# Patient Record
Sex: Male | Born: 1940 | Race: White | Hispanic: No | Smoking: Former smoker
Health system: Southern US, Community
[De-identification: ages and names within clinical notes are randomized; demographics above are authoritative.]

## PROBLEM LIST (undated history)

## (undated) DIAGNOSIS — F32A Depression, unspecified: Secondary | ICD-10-CM

## (undated) DIAGNOSIS — I639 Cerebral infarction, unspecified: Secondary | ICD-10-CM

## (undated) DIAGNOSIS — E079 Disorder of thyroid, unspecified: Secondary | ICD-10-CM

## (undated) DIAGNOSIS — G20 Parkinson's disease: Secondary | ICD-10-CM

## (undated) DIAGNOSIS — F329 Major depressive disorder, single episode, unspecified: Secondary | ICD-10-CM

## (undated) DIAGNOSIS — R270 Ataxia, unspecified: Secondary | ICD-10-CM

## (undated) DIAGNOSIS — R1310 Dysphagia, unspecified: Secondary | ICD-10-CM

## (undated) DIAGNOSIS — G20A1 Parkinson's disease without dyskinesia, without mention of fluctuations: Secondary | ICD-10-CM

## (undated) DIAGNOSIS — F419 Anxiety disorder, unspecified: Secondary | ICD-10-CM

## (undated) DIAGNOSIS — K219 Gastro-esophageal reflux disease without esophagitis: Secondary | ICD-10-CM

## (undated) DIAGNOSIS — G834 Cauda equina syndrome: Secondary | ICD-10-CM

## (undated) DIAGNOSIS — N390 Urinary tract infection, site not specified: Secondary | ICD-10-CM

## (undated) DIAGNOSIS — G5682 Other specified mononeuropathies of left upper limb: Secondary | ICD-10-CM

## (undated) DIAGNOSIS — M1990 Unspecified osteoarthritis, unspecified site: Secondary | ICD-10-CM

## (undated) HISTORY — DX: Anxiety disorder, unspecified: F41.9

## (undated) HISTORY — DX: Ataxia, unspecified: R27.0

## (undated) HISTORY — DX: Unspecified osteoarthritis, unspecified site: M19.90

## (undated) HISTORY — DX: Major depressive disorder, single episode, unspecified: F32.9

## (undated) HISTORY — DX: Depression, unspecified: F32.A

## (undated) HISTORY — DX: Disorder of thyroid, unspecified: E07.9

## (undated) HISTORY — DX: Cauda equina syndrome: G83.4

## (undated) HISTORY — PX: LAMINECTOMY: SHX219

## (undated) HISTORY — DX: Other specified mononeuropathies of left upper limb: G56.82

## (undated) HISTORY — DX: Dysphagia, unspecified: R13.10

## (undated) HISTORY — PX: IMPLANTATION / PLACEMENT EPIDURAL NEUROSTIMULATOR ELECTRODES: SUR687

## (undated) HISTORY — DX: Urinary tract infection, site not specified: N39.0

## (undated) HISTORY — PX: SPINE SURGERY: SHX786

---

## 2012-12-07 ENCOUNTER — Non-Acute Institutional Stay (SKILLED_NURSING_FACILITY): Payer: 59 | Admitting: Internal Medicine

## 2012-12-07 DIAGNOSIS — E039 Hypothyroidism, unspecified: Secondary | ICD-10-CM

## 2012-12-07 DIAGNOSIS — N400 Enlarged prostate without lower urinary tract symptoms: Secondary | ICD-10-CM

## 2012-12-07 DIAGNOSIS — F411 Generalized anxiety disorder: Secondary | ICD-10-CM

## 2012-12-07 DIAGNOSIS — I10 Essential (primary) hypertension: Secondary | ICD-10-CM

## 2012-12-12 ENCOUNTER — Encounter: Payer: Self-pay | Admitting: Family

## 2012-12-12 ENCOUNTER — Non-Acute Institutional Stay (SKILLED_NURSING_FACILITY): Payer: 59 | Admitting: Family

## 2012-12-12 DIAGNOSIS — E039 Hypothyroidism, unspecified: Secondary | ICD-10-CM

## 2012-12-12 DIAGNOSIS — G4700 Insomnia, unspecified: Secondary | ICD-10-CM

## 2012-12-12 DIAGNOSIS — G250 Essential tremor: Secondary | ICD-10-CM

## 2012-12-12 DIAGNOSIS — Z87898 Personal history of other specified conditions: Secondary | ICD-10-CM

## 2012-12-12 DIAGNOSIS — Z87448 Personal history of other diseases of urinary system: Secondary | ICD-10-CM

## 2012-12-12 NOTE — Progress Notes (Signed)
Patient ID: Kristopher Jennings, male   DOB: August 25, 1940, 72 y.o.   MRN: [id number] Date: 12/12/12  Facility: [facility]    Chief Complaint  Patient presents with  . Acute Visit    Intolerance to medication    HPI: Pt presents with c/o intolerance to Primidone. Pt reports Primidone is not effective and tremors were mitigated with Sinemet solely. Pt denies N/V/D/fever/chills/malaise/sedation, or fatigue. Pt reports having a RUE essential tremor. Pt desires to d/c Primidone. Pt endorses receiving Primidone 150 mg of Primidone bid.  Pt denies further complaints and concerns at present.  Nursing staff denies further concerns and issues at present as well.        Allergies  Allergen Reactions  . Demerol [Meperidine]   . Lactose Intolerance (Gi)   . Nickel   . Oxycodone       Medication List       This list is accurate as of: 12/12/12 11:59 PM.  Always use your most recent med list.               carbidopa-levodopa 25-100 MG per tablet  Commonly known as:  SINEMET IR  Take 2 tablets by mouth 3 (three) times daily.     clonazePAM 0.5 MG tablet  Commonly known as:  KLONOPIN  Take 0.5 mg by mouth at bedtime.     diclofenac sodium 1 % Gel  Commonly known as:  VOLTAREN  Apply 2 g topically 4 (four) times daily. Apply to lower back     docusate sodium 100 MG capsule  Commonly known as:  COLACE  Take 100 mg by mouth 2 (two) times daily.     guaiFENesin 100 MG/5ML Soln  Commonly known as:  ROBITUSSIN  Take 5 mLs by mouth every 4 (four) hours as needed.     hydroxypropyl methylcellulose 2.5 % ophthalmic solution  Commonly known as:  ISOPTO TEARS  Place 1 drop into both eyes 3 (three) times daily as needed (For dryness).     levothyroxine 100 MCG tablet  Commonly known as:  SYNTHROID, LEVOTHROID  Take 100 mcg by mouth daily before breakfast. Take one tablet on Sun, Tues, Wed, Thurs, and Sat     levothyroxine 112 MCG tablet  Commonly known as:  SYNTHROID, LEVOTHROID  Take  112 mcg by mouth daily before breakfast. Take One Tablet Monday and Friday     Melatonin 3 MG Caps  Take by mouth at bedtime.     primidone 50 MG tablet  Commonly known as:  MYSOLINE  Take 50 mg by mouth 2 (two) times daily. Three tablets for total of 150 mg     sertraline 100 MG tablet  Commonly known as:  ZOLOFT  Take 100 mg by mouth daily.     tamsulosin 0.4 MG Caps capsule  Commonly known as:  FLOMAX  Take 0.4 mg by mouth. Take two capsules po qhs        DATA REVIEWED  Laboratory Studies:  12/05/12 WBC-5.7, RBC 4.6, Hemoglobin 13, Hematocrit 39, MCV 84.3, MCH 28.0, MCHC 33.2, RDW 16.9, [place] 170     Past Medical History  Diagnosis Date  . Arthritis   . Depression   . Dysphagia   . UTI (lower urinary tract infection)   . Ataxia   . Thyroid disease   . Anxiety   . Cauda equina syndrome   . Essential Tremors     Past Surgical History  Procedure Laterality Date  . Laminectomy    . Spine  surgery      History   Social History  . Marital Status: Unknown    Spouse Name: N/A    Number of Children: N/A  . Years of Education: N/A   Occupational History  . Not on file.   Social History Main Topics  . Smoking status: Not on file  . Smokeless tobacco: Not on file  . Alcohol Use: Not on file  . Drug Use: Not on file  . Sexual Activity: No   Other Topics Concern  . Not on file   Social History Narrative  . No narrative on file    Review of Systems  Constitutional: Negative.   HENT:       Occasional tension HA; eradicated with Tylenol  Eyes: Negative.   Respiratory: Negative.   Cardiovascular: Negative.   Gastrointestinal: Negative.   Genitourinary: Positive for dysuria and urgency.       H/O Self catherization s/p Cauda Equina  Skin: Negative.   Neurological: Positive for tremors and headaches.       RUE tremor  Psychiatric/Behavioral: Positive for depression.       SI-     Physical Exam Filed Vitals:   12/12/12 1233  BP: 110/78  Pulse:  78  Temp: 98 F (36.7 C)  Resp: 18  SpO2: 97%   There is no height or weight on file to calculate BMI. Physical Exam  Constitutional: He is oriented to person, place, and time. He appears well-developed. No distress.  HENT:  Head: Normocephalic.  Mouth/Throat: Oropharynx is clear and moist.  Eyes: Conjunctivae and EOM are normal. Pupils are equal, round, and reactive to light.  Cardiovascular: Normal rate, regular rhythm and normal heart sounds.   Pulmonary/Chest: Effort normal and breath sounds normal. No respiratory distress. He exhibits no tenderness.  Abdominal: Soft.  Musculoskeletal:  Self-propelled in wheelchair  Neurological: He is alert and oriented to person, place, and time. He displays tremor. GCS eye subscore is 4. GCS verbal subscore is 5. GCS motor subscore is 6.  Tremor to RUE  Skin: Skin is warm and dry.    ASSESSMENT/PLAN  Tremors-decrease dose of Primidone to 100 mg ; follow up with Dr. Haqi-Neurologist; continue Sinemet for tremor management. Urinary Retention: Perform I/O Cath and note findings Arthritis: Continue Voltaren Gel; pt  Stable and medication effective to date Hypothyroidism: Continue with current dosage of Levothyroxine; treatment effective to date Insomnia:Continue with current Melatonin dosage; effective to date and pt stable

## 2012-12-13 DIAGNOSIS — G4700 Insomnia, unspecified: Secondary | ICD-10-CM | POA: Insufficient documentation

## 2012-12-13 DIAGNOSIS — Z87898 Personal history of other specified conditions: Secondary | ICD-10-CM | POA: Insufficient documentation

## 2012-12-13 DIAGNOSIS — G250 Essential tremor: Secondary | ICD-10-CM | POA: Insufficient documentation

## 2012-12-13 DIAGNOSIS — E039 Hypothyroidism, unspecified: Secondary | ICD-10-CM | POA: Insufficient documentation

## 2012-12-13 MED ORDER — PRIMIDONE 50 MG PO TABS: 50.0000 mg | ORAL_TABLET | Freq: Two times a day (BID) | ORAL | Status: DC

## 2013-01-04 ENCOUNTER — Non-Acute Institutional Stay (SKILLED_NURSING_FACILITY): Payer: 59 | Admitting: Internal Medicine

## 2013-01-04 DIAGNOSIS — E039 Hypothyroidism, unspecified: Secondary | ICD-10-CM

## 2013-01-08 DIAGNOSIS — I10 Essential (primary) hypertension: Secondary | ICD-10-CM | POA: Insufficient documentation

## 2013-01-08 DIAGNOSIS — F411 Generalized anxiety disorder: Secondary | ICD-10-CM | POA: Insufficient documentation

## 2013-01-08 DIAGNOSIS — N400 Enlarged prostate without lower urinary tract symptoms: Secondary | ICD-10-CM | POA: Insufficient documentation

## 2013-01-08 NOTE — Progress Notes (Signed)
Patient ID: Kristopher Jennings, male   DOB: 01-19-1941, 72 y.o.   MRN: [id number]        HISTORY & PHYSICAL  DATE: 12/07/2012     FACILITY: [facility]  LEVEL OF CARE: SNF (31)  ALLERGIES:  Allergies  Allergen Reactions  . Demerol [Meperidine]   . Lactose Intolerance (Gi)   . Nickel   . Oxycodone     CHIEF COMPLAINT:  Manage hypertension, hypothyroidism, and BPH.    HISTORY OF PRESENT ILLNESS:  The patient is a 72 year-old, Caucasian male who was hospitalized secondary to difficult with ambulation, altered mentation, dysphagia, and weakness.  After hospitalization, he is admitted to this facility for short-term rehabilitation.   He has the following problems:    HTN: Pt 's HTN remains stable.  Denies CP, sob, DOE, pedal edema, headaches, dizziness or visual disturbances.  No complications from the medications currently being used.  Last BP :  122/80.    HYPOTHYROIDISM: The hypothyroidism remains stable. No complications noted from the medications presently being used.  The patient denies fatigue or constipation.  Last TSH:  Not available.      BPH: The patient's BPH remains stable. Patient denies urinary hesitancy or dribbling. No complications reported from the current medications being used.    PAST MEDICAL HISTORY :  Past Medical History  Diagnosis Date  . Arthritis   . Depression   . Dysphagia   . UTI (lower urinary tract infection)   . Ataxia   . Thyroid disease   . Anxiety   . Cauda equina syndrome   . Essential Tremors    Hypothyroidism.    Obstructive sleep apnea.    GERD.    Neuropathy.    Spinal stenosis.    PAST SURGICAL HISTORY: Past Surgical History  Procedure Laterality Date  . Laminectomy    . Spine surgery     Tonsillectomy.    Deep brain stimulator placement.    Deep brain stimulator electrode revision.    SOCIAL HISTORY: MARITAL HISTORY:  The patient is divorced.   TOBACCO USE:  Former smoker of two packs a day for 25  years.  He quit in 1995.  He smoked cigarettes.   ALCOHOL:  No alcohol use.   ILLICIT DRUGS:  No illicit drug use.    FAMILY HISTORY:  Significant for:    Coronary artery disease.    Diabetes mellitus.    Alzheimer's dementia.    Cancer.    CURRENT MEDICATIONS: Reviewed per [facility]  REVIEW OF SYSTEMS:  See HPI otherwise 14 point ROS is negative.  PHYSICAL EXAMINATION  VS:  T 96.7       P 78      RR 18      BP 122/80      POX 97%        WT (Lb) 125  GENERAL: no acute distress, normal body habitus EYES: conjunctivae normal, sclerae normal, normal eye lids MOUTH/THROAT: lips without lesions,no lesions in the mouth,tongue is without lesions,uvula elevates in midline NECK: supple, trachea midline, no neck masses, no thyroid tenderness, no thyromegaly LYMPHATICS: no LAN in the neck, no supraclavicular LAN RESPIRATORY: breathing is even & unlabored, BS CTAB CARDIAC: RRR, no murmur,no extra heart sounds, no edema GI:  ABDOMEN: abdomen soft, normal BS, no masses, no tenderness  LIVER/SPLEEN: no hepatomegaly, no splenomegaly MUSCULOSKELETAL: HEAD: normal to inspection & palpation BACK: no kyphosis, scoliosis or spinal processes tenderness EXTREMITIES: LEFT UPPER EXTREMITY: full range of motion,  normal strength & tone RIGHT UPPER EXTREMITY:  full range of motion, normal strength & tone LEFT LOWER EXTREMITY: strength intact, range of motion moderate   RIGHT LOWER EXTREMITY: strength intact, range of motion moderate   PSYCHIATRIC: the patient is alert & oriented to person, affect & behavior appropriate NEUROLOGICAL:   The patient has left-sided drooping.    LABS/RADIOLOGY: CBC normal.    CT of the head:  No acute findings.    ASSESSMENT/PLAN:  Hypothyroidism.  Continue levothyroxine.    Hypertension.  Well controlled.    BPH.  Continue current medications.    Anxiety.  Continue clonazepam.    Essential tremor.  Continue primidone.    Depression.  Continue Zoloft.    Check  CBC and BMP.    I have reviewed patient's medical records received at admission/from hospitalization.  CPT CODE: 78295

## 2013-01-11 ENCOUNTER — Non-Acute Institutional Stay (SKILLED_NURSING_FACILITY): Payer: 59 | Admitting: Internal Medicine

## 2013-01-11 DIAGNOSIS — E039 Hypothyroidism, unspecified: Secondary | ICD-10-CM

## 2013-01-11 DIAGNOSIS — N400 Enlarged prostate without lower urinary tract symptoms: Secondary | ICD-10-CM

## 2013-01-11 DIAGNOSIS — F411 Generalized anxiety disorder: Secondary | ICD-10-CM

## 2013-01-11 DIAGNOSIS — G20 Parkinson's disease: Secondary | ICD-10-CM

## 2013-01-12 DIAGNOSIS — G20 Parkinson's disease: Secondary | ICD-10-CM | POA: Insufficient documentation

## 2013-01-12 DIAGNOSIS — G20A1 Parkinson's disease without dyskinesia, without mention of fluctuations: Secondary | ICD-10-CM

## 2013-01-12 HISTORY — DX: Parkinson's disease without dyskinesia, without mention of fluctuations: G20.A1

## 2013-01-12 HISTORY — DX: Parkinson's disease: G20

## 2013-01-12 NOTE — Progress Notes (Signed)
PROGRESS NOTE  DATE: 01/11/2013  FACILITY: Nursing Home Location: [facility]  LEVEL OF CARE: SNF (31)  Routine Visit  CHIEF COMPLAINT:  Manage hypothyroidism, Parkinson's disease and BPH  HISTORY OF PRESENT ILLNESS:  REASSESSMENT OF ONGOING PROBLEM(S):  HYPOTHYROIDISM: The hypothyroidism remains stable. No complications noted from the medications presently being used.  The patient denies fatigue or constipation.  Last TSH in 10/14 TSH 8.27.  BPH: The patient's BPH remains stable. Patient denies urinary hesitancy or dribbling. No complications reported from the current medications being used.  PARKINSON'S DISEASE: pt's Parkinson's disease is stable.  Denies progression of sx recently.  Pt is tolerating Parkinson's disease medications without any complications.  PAST MEDICAL HISTORY : Reviewed.  No changes.  CURRENT MEDICATIONS: Reviewed per [facility]  REVIEW OF SYSTEMS:  GENERAL: no change in appetite, no fatigue, no weight changes, no fever, chills or weakness RESPIRATORY: no cough, SOB, DOE, wheezing, hemoptysis, complains of sore throat CARDIAC: no chest pain, edema or palpitations GI: no abdominal pain, diarrhea, constipation, heart burn, nausea or vomiting  PHYSICAL EXAMINATION  VS:  T 96       P 89     RR 18     BP 121/87     POX %     WT (Lb) 128  GENERAL: no acute distress, normal body habitus EYES: conjunctivae normal, sclerae normal, normal eye lids NECK: supple, trachea midline, no neck masses, no thyroid tenderness, no thyromegaly OROPHARYNX: Clear, no exudate or erythema LYMPHATICS: no LAN in the neck, no supraclavicular LAN RESPIRATORY: breathing is even & unlabored, BS CTAB CARDIAC: RRR, no murmur,no extra heart sounds, no edema GI: abdomen soft, normal BS, no masses, no tenderness, no hepatomegaly, no splenomegaly PSYCHIATRIC: the patient is alert & oriented to person, affect & behavior appropriate  LABS/RADIOLOGY:  Phenobarbital level  6  ASSESSMENT/PLAN:  Hypothyroidism uncontrolled. Levothyroxine was increased. Recheck TSH in 6 weeks if pending. Parkinson's disease-continue medications. BPH-well-controlled Anxiety-Klonopin was started. Constipation-well-controlled Insomnia-well-controlled Depression-on Zoloft. Anxieties-new problem. Start Chloraseptic Spray 2 sprays twice a day for 5 days. Check CBC and CMP  CPT CODE: 16109

## 2013-01-18 ENCOUNTER — Non-Acute Institutional Stay (SKILLED_NURSING_FACILITY): Payer: 59 | Admitting: Internal Medicine

## 2013-01-18 DIAGNOSIS — R3911 Hesitancy of micturition: Secondary | ICD-10-CM

## 2013-01-25 ENCOUNTER — Non-Acute Institutional Stay (SKILLED_NURSING_FACILITY): Payer: 59 | Admitting: Internal Medicine

## 2013-01-25 DIAGNOSIS — N390 Urinary tract infection, site not specified: Secondary | ICD-10-CM

## 2013-01-28 NOTE — Progress Notes (Signed)
Patient ID: Kristopher Jennings, male   DOB: [birth year], 72 y.o.   MRN: [id number]        PROGRESS NOTE  DATE: 01/04/2013  FACILITY:  [facility]  LEVEL OF CARE: SNF (31)  Acute Visit  CHIEF COMPLAINT:  Manage hypothyroidism.    HISTORY OF PRESENT ILLNESS: I was requested by the staff to assess the patient regarding above problem(s):  HYPOTHYROIDISM: The hypothyroidism is unstable. No complications noted from the medications presently being used.  The patient denies fatigue or constipation.  Last TSH:  8.27 on 12/28/2012.    PAST MEDICAL HISTORY : Reviewed.  No changes.  CURRENT MEDICATIONS: Reviewed per [facility]  REVIEW OF SYSTEMS:  Difficult to obtain.  Patient is a poor historian.    PHYSICAL EXAMINATION  GENERAL: no acute distress, normal body habitus NECK: supple, trachea midline, no neck masses, no thyroid tenderness, no thyromegaly RESPIRATORY: breathing is even & unlabored, BS CTAB CARDIAC: RRR, no murmur,no extra heart sounds, no edema GI: abdomen soft, normal BS, no masses, no tenderness, no hepatomegaly, no splenomegaly PSYCHIATRIC: the patient is alert & oriented to person, affect & behavior appropriate  ASSESSMENT/PLAN:  Hypothyroidism.  Unstable problem.  Increase levothyroxine to 112 mcg.  Check TSH in six weeks.    CPT CODE: 40981

## 2013-02-08 ENCOUNTER — Non-Acute Institutional Stay (SKILLED_NURSING_FACILITY): Payer: 59 | Admitting: Internal Medicine

## 2013-02-08 ENCOUNTER — Encounter: Payer: Self-pay | Admitting: Internal Medicine

## 2013-02-08 DIAGNOSIS — E039 Hypothyroidism, unspecified: Secondary | ICD-10-CM

## 2013-02-08 DIAGNOSIS — G20 Parkinson's disease: Secondary | ICD-10-CM

## 2013-02-08 DIAGNOSIS — F411 Generalized anxiety disorder: Secondary | ICD-10-CM

## 2013-02-08 DIAGNOSIS — N400 Enlarged prostate without lower urinary tract symptoms: Secondary | ICD-10-CM

## 2013-02-08 NOTE — Progress Notes (Signed)
PROGRESS NOTE  DATE: 02-08-13  FACILITY: Nursing Home Location: [facility]  LEVEL OF CARE: SNF (31)  Routine Visit  CHIEF COMPLAINT:  Manage hypothyroidism, Parkinson's disease and BPH  HISTORY OF PRESENT ILLNESS:  REASSESSMENT OF ONGOING PROBLEM(S):  HYPOTHYROIDISM: The hypothyroidism remains stable. No complications noted from the medications presently being used.  The patient denies fatigue or constipation.  Last TSH in 10/14 TSH 8.27.  BPH: The patient's BPH remains stable. Patient denies urinary hesitancy or dribbling. No complications reported from the current medications being used.  PARKINSON'S DISEASE: pt's Parkinson's disease is stable.  Denies progression of sx recently.  Pt is tolerating Parkinson's disease medications without any complications.  PAST MEDICAL HISTORY : Reviewed.  No changes.  CURRENT MEDICATIONS: Reviewed per [facility]  REVIEW OF SYSTEMS:  GENERAL: no change in appetite, no fatigue, no weight changes, no fever, chills or weakness RESPIRATORY: no cough, SOB, DOE, wheezing, hemoptysis, complains of sore throat CARDIAC: no chest pain, edema or palpitations GI: no abdominal pain, diarrhea, constipation, heart burn, nausea or vomiting  PHYSICAL EXAMINATION  VS:  T 96.7       P 74     RR 18     BP 130/78     POX %     WT (Lb) 130  GENERAL: no acute distress, normal body habitus NECK: supple, trachea midline, no neck masses, no thyroid tenderness, no thyromegaly RESPIRATORY: breathing is even & unlabored, BS CTAB CARDIAC: RRR, no murmur,no extra heart sounds, no edema GI: abdomen soft, normal BS, no masses, no tenderness, no hepatomegaly, no splenomegaly PSYCHIATRIC: the patient is alert & oriented to person, affect & behavior appropriate  LABS/RADIOLOGY:  10-14 CBC and CMP normal, Phenobarbital level 6  ASSESSMENT/PLAN:  Hypothyroidism uncontrolled. Levothyroxine was increased. Recheck TSH in 6 weeks if pending. Parkinson's  disease-continue medications. BPH-well-controlled Anxiety-stable Constipation-well-controlled Insomnia-well-controlled Depression-on Zoloft.  CPT CODE: 78295

## 2013-02-19 DIAGNOSIS — R3911 Hesitancy of micturition: Secondary | ICD-10-CM | POA: Insufficient documentation

## 2013-02-19 NOTE — Progress Notes (Signed)
Patient ID: Kristopher Jennings, male   DOB: 02/22/1941, 72 y.o.   MRN: [id number]        PROGRESS NOTE  DATE: 01/18/2013  FACILITY:  [facility]  LEVEL OF CARE: SNF (31)  Acute Visit  CHIEF COMPLAINT:  Manage difficulty voiding.    HISTORY OF PRESENT ILLNESS: I was requested by the staff to assess the patient regarding above problem(s):  Staff report that patient is complaining of increasing difficulty in voiding and not knowing when he has to void.  He also has complaints of stiffness and difficulty moving.  Patient overall is a poor historian.  Staff cannot identify precipitating or alleviating factors.  There is no temporal relationship.    PAST MEDICAL HISTORY : Reviewed.  No changes.  CURRENT MEDICATIONS: Reviewed per [facility]  REVIEW OF SYSTEMS:  Difficult to obtain.  Patient is a poor historian.    PHYSICAL EXAMINATION  VS:  T 97.1       P 84      RR 18      BP 120/72     POX %       WT (Lb)  GENERAL: no acute distress, normal body habitus NECK: supple, trachea midline, no neck masses, no thyroid tenderness, no thyromegaly RESPIRATORY: breathing is even & unlabored, BS CTAB CARDIAC: RRR, no murmur,no extra heart sounds, no edema GI: abdomen soft, normal BS, no masses, no tenderness, no hepatomegaly, no splenomegaly PSYCHIATRIC: the patient is alert & oriented to person, affect & behavior appropriate  ASSESSMENT/PLAN:  Urinary hesitancy.  New problem.  Check UA, culture and sensitivities.    CPT CODE: 13244

## 2013-02-22 ENCOUNTER — Non-Acute Institutional Stay (SKILLED_NURSING_FACILITY): Payer: 59 | Admitting: Internal Medicine

## 2013-02-22 DIAGNOSIS — E039 Hypothyroidism, unspecified: Secondary | ICD-10-CM

## 2013-02-23 ENCOUNTER — Encounter: Payer: Self-pay | Admitting: Internal Medicine

## 2013-02-23 NOTE — Progress Notes (Signed)
PROGRESS NOTE  DATE: 02/22/2013  FACILITY:  [facility]  LEVEL OF CARE: SNF (31)  Acute Visit  CHIEF COMPLAINT:  Manage hypothyroidism  HISTORY OF PRESENT ILLNESS: I was requested by the staff to assess the patient regarding above problem(s):  HYPOTHYROIDISM: The hypothyroidism remains stable. No complications noted from the medications presently being used.  The patient denies fatigue or constipation.  Last TSH 5.11 on 02-16-13.  PAST MEDICAL HISTORY : Reviewed.  No changes.  CURRENT MEDICATIONS: Reviewed per [facility]  PHYSICAL EXAMINATION  GENERAL: no acute distress,  Thin body habitus NECK: supple, trachea midline, no neck masses, no thyroid tenderness, no thyromegaly RESPIRATORY: breathing is even & unlabored, BS CTAB CARDIAC: RRR, no murmur,no extra heart sounds, no edema   LABS/RADIOLOGY: see HPI  ASSESSMENT/PLAN:  Hypothyroidism-continue current levothyroxine dose. Check TSH in 6 weeks.  CPT CODE: 96045

## 2013-02-26 DIAGNOSIS — N390 Urinary tract infection, site not specified: Secondary | ICD-10-CM | POA: Insufficient documentation

## 2013-02-26 NOTE — Progress Notes (Signed)
Patient ID: Kristopher Jennings, male   DOB: 04-11-1940, 72 y.o.   MRN: [id number]        PROGRESS NOTE  DATE: 01/25/2013    FACILITY:  [facility]  LEVEL OF CARE: SNF (31)  Acute Visit  CHIEF COMPLAINT:  Manage UTI.    HISTORY OF PRESENT ILLNESS: I was requested by the staff to assess the patient regarding above problem(s):  On 01/19/2013, patient's urinalysis done due to urinary symptoms showed cloudy appearance, +3 WBC esterase, urine nitrite positive, WBCs greater than 30.  Urine culture grows E.coli significantly.    PAST MEDICAL HISTORY : Reviewed.  No changes.  CURRENT MEDICATIONS: Reviewed per [facility]  REVIEW OF SYSTEMS:  GENERAL: no change in appetite, no fatigue, no weight changes, no fever, chills or weakness RESPIRATORY: no cough, SOB, DOE,, wheezing, hemoptysis CARDIAC: no chest pain, edema or palpitations GI: no abdominal pain, diarrhea, constipation, heart burn, nausea or vomiting  PHYSICAL EXAMINATION  GENERAL: no acute distress, thin body habitus NECK: supple, trachea midline, no neck masses, no thyroid tenderness, no thyromegaly RESPIRATORY: breathing is even & unlabored, BS CTAB CARDIAC: RRR, no murmur,no extra heart sounds, no edema GI: abdomen soft, normal BS, no masses, no tenderness, no hepatomegaly, no splenomegaly PSYCHIATRIC: the patient is alert & oriented to person, affect & behavior appropriate  ASSESSMENT/PLAN:  UTI.  New problem.  Start Bactrim double-strength, 1 tablet b.i.d. for one week and probiotics b.i.d. for 10 days.    CPT CODE: 40981

## 2013-03-01 ENCOUNTER — Non-Acute Institutional Stay (SKILLED_NURSING_FACILITY): Payer: 59 | Admitting: Internal Medicine

## 2013-03-01 DIAGNOSIS — E039 Hypothyroidism, unspecified: Secondary | ICD-10-CM

## 2013-03-01 DIAGNOSIS — N400 Enlarged prostate without lower urinary tract symptoms: Secondary | ICD-10-CM

## 2013-03-01 DIAGNOSIS — F411 Generalized anxiety disorder: Secondary | ICD-10-CM

## 2013-03-01 DIAGNOSIS — G20 Parkinson's disease: Secondary | ICD-10-CM

## 2013-03-03 ENCOUNTER — Encounter: Payer: Self-pay | Admitting: Internal Medicine

## 2013-03-03 NOTE — Progress Notes (Signed)
PROGRESS NOTE  DATE: 03-01-13  FACILITY: Nursing Home Location: [facility]  LEVEL OF CARE: SNF (31)  Routine Visit  CHIEF COMPLAINT:  Manage hypothyroidism, Parkinson's disease and BPH  HISTORY OF PRESENT ILLNESS:  REASSESSMENT OF ONGOING PROBLEM(S):  HYPOTHYROIDISM: The hypothyroidism remains stable. No complications noted from the medications presently being used.  The patient denies fatigue or constipation.  Last TSH in 10/14 TSH 8.27, 11-14 TSH 5.11 a.  BPH: The patient's BPH remains stable. Patient denies urinary hesitancy or dribbling. No complications reported from the current medications being used.  PARKINSON'S DISEASE: pt's Parkinson's disease is stable.  Denies progression of sx recently.  Pt is tolerating Parkinson's disease medications without any complications.  PAST MEDICAL HISTORY : Reviewed.  No changes.  CURRENT MEDICATIONS: Reviewed per [facility]  REVIEW OF SYSTEMS:  GENERAL: no change in appetite, no fatigue, no weight changes, no fever, chills or weakness RESPIRATORY: no cough, SOB, DOE, wheezing, hemoptysis, complains of sore throat CARDIAC: no chest pain, edema or palpitations GI: no abdominal pain, diarrhea, constipation, heart burn, nausea or vomiting  PHYSICAL EXAMINATION  VS:  T 96.4       P 88     RR 18     BP 140/70     POX %     WT (Lb) 122  GENERAL: no acute distress, normal body habitus NECK: supple, trachea midline, no neck masses, no thyroid tenderness, no thyromegaly RESPIRATORY: breathing is even & unlabored, BS CTAB CARDIAC: RRR, no murmur,no extra heart sounds, no edema GI: abdomen soft, normal BS, no masses, no tenderness, no hepatomegaly, no splenomegaly PSYCHIATRIC: the patient is alert & oriented to person, affect & behavior appropriate  LABS/RADIOLOGY:  10-14 CBC and CMP normal, Phenobarbital level 6  ASSESSMENT/PLAN:  Hypothyroidism uncontrolled. Recheck TSH in 6 weeks if pending. Parkinson's  disease-continue medications. BPH-well-controlled Anxiety-stable Constipation-well-controlled Insomnia-well-controlled Depression-on Zoloft.  CPT CODE: 78469

## 2013-04-03 ENCOUNTER — Non-Acute Institutional Stay (SKILLED_NURSING_FACILITY): Payer: 59 | Admitting: Internal Medicine

## 2013-04-03 DIAGNOSIS — N400 Enlarged prostate without lower urinary tract symptoms: Secondary | ICD-10-CM

## 2013-04-03 DIAGNOSIS — E039 Hypothyroidism, unspecified: Secondary | ICD-10-CM

## 2013-04-03 DIAGNOSIS — F411 Generalized anxiety disorder: Secondary | ICD-10-CM

## 2013-04-03 DIAGNOSIS — G20 Parkinson's disease: Secondary | ICD-10-CM

## 2013-04-03 NOTE — Progress Notes (Signed)
         PROGRESS NOTE  DATE: 04-03-13  FACILITY: Nursing Home Location[facility]  LEVEL OF CARE: SNF (31)  Routine Visit  CHIEF COMPLAINT:  Manage hypothyroidism, Parkinson's disease and BPH  HISTORY OF PRESENT ILLNESS:  REASSESSMENT OF ONGOING PROBLEM(S):  HYPOTHYROIDISM: The hypothyroidism remains stable. No complications noted from the medications presently being used.  The staff deny fatigue or constipation.  Last TSH in 10/14 TSH 8.27, 11-14 TSH 5.11. Patient is a poor historian.  BPH: The patient's BPH remains stable. Staff deny urinary hesitancy or dribbling. No complications reported from the current medications being used.  PARKINSON'S DISEASE: pt's Parkinson's disease is stable.  Staff Deny progression of sx recently.  Pt is tolerating Parkinson's disease medications without any complications.  PAST MEDICAL HISTORY : Reviewed.  No changes.  CURRENT MEDICATIONS: Reviewed per [facility]  REVIEW OF SYSTEMS: Difficult to obtain due to patient being a poor historian  PHYSICAL EXAMINATION  VS:  T 97.6.       P 64     RR 18     BP 136/76     POX %     WT (Lb) 122  GENERAL: no acute distress, normal body habitus NECK: supple, trachea midline, no neck masses, no thyroid tenderness, no thyromegaly RESPIRATORY: breathing is even & unlabored, BS CTAB CARDIAC: RRR, no murmur,no extra heart sounds, no edema GI: abdomen soft, normal BS, no masses, no tenderness, no hepatomegaly, no splenomegaly PSYCHIATRIC: the patient is alert & oriented to person, affect & behavior appropriate  LABS/RADIOLOGY:  10-14 CBC and CMP normal, Phenobarbital level 6  ASSESSMENT/PLAN:  Hypothyroidism uncontrolled. Recheck TSH in 6 weeks if pending. Parkinson's disease-continue medications. BPH-well-controlled Anxiety-stable Constipation-well-controlled Insomnia-well-controlled Depression-on Zoloft.  CPT CODE: [id number]

## 2013-04-24 ENCOUNTER — Non-Acute Institutional Stay (SKILLED_NURSING_FACILITY): Payer: 59 | Admitting: Internal Medicine

## 2013-04-24 DIAGNOSIS — R05 Cough: Secondary | ICD-10-CM

## 2013-04-24 DIAGNOSIS — E039 Hypothyroidism, unspecified: Secondary | ICD-10-CM

## 2013-04-24 DIAGNOSIS — R059 Cough, unspecified: Secondary | ICD-10-CM

## 2013-05-03 DIAGNOSIS — R059 Cough, unspecified: Secondary | ICD-10-CM | POA: Insufficient documentation

## 2013-05-03 DIAGNOSIS — R05 Cough: Secondary | ICD-10-CM | POA: Insufficient documentation

## 2013-05-03 NOTE — Progress Notes (Signed)
         PROGRESS NOTE  DATE: 04/24/2013  [facility]ILITY[facility]  [facility]  LEVEL OF CARE: SNF (31)  Acute Visit  CHIEF COMPLAINT:  Manage dry cough and hypothyroidism  HISTORY OF PRESENT ILLNESS: I was requested by the staff to assess the patient regarding above problem(s):  COUGH: New Problem. Staff reports a new onset cough for several days. Cough is non productive. Precipitating or alleviating factors cannot be identified. There are no other associated signs and symptoms such as shortness of breath, fever, chills or night sweats. There is no respiratory insufficiency. There is no temporal relationship. Patient is a poor historian.  HYPOTHYROIDISM: The hypothyroidism remains stable. No complications noted from the medications presently being used.  The staff deny fatigue or constipation.  Last TSH 4.3 in 1-15.  PAST MEDICAL HISTORY : Reviewed.  No changes.  CURRENT MEDICATIONS: [facility]  REVIEW OF SYSTEMS: Unobtainable due to patient being a poor historian  PHYSICAL EXAMINATION  VS:  T 98        P 77      RR 22       BP 120/62      POX % 98  GENERAL: no acute distress, thin body habitus EYES: conjunctivae normal, sclerae normal, normal eye lids NECK: supple, trachea midline, no neck masses, no thyroid tenderness, no thyromegaly LYMPHATICS: no LAN in the neck, no supraclavicular LAN RESPIRATORY: breathing is even & unlabored, BS decreased CARDIAC: RRR, no murmur,no extra heart sounds, no edema  LABS/RADIOLOGY: See history of present illness  ASSESSMENT/PLAN:  Cough-new problem. Obtain chest x-ray. Start Mucinex 600 mg twice a day for one week. Hypothyroidism-well controlled  CPT CODE: [id number]

## 2013-08-08 ENCOUNTER — Non-Acute Institutional Stay (SKILLED_NURSING_FACILITY): Payer: PRIVATE HEALTH INSURANCE | Admitting: Internal Medicine

## 2013-08-08 DIAGNOSIS — N400 Enlarged prostate without lower urinary tract symptoms: Secondary | ICD-10-CM

## 2013-08-08 DIAGNOSIS — E039 Hypothyroidism, unspecified: Secondary | ICD-10-CM

## 2013-08-08 DIAGNOSIS — F411 Generalized anxiety disorder: Secondary | ICD-10-CM

## 2013-08-08 DIAGNOSIS — G20 Parkinson's disease: Secondary | ICD-10-CM

## 2013-08-09 NOTE — Progress Notes (Signed)
          PROGRESS NOTE  DATE: 08-08-13  FACILITY: [facility] Home [facility]  LEVEL OF CARE: SNF (31)  Routine Visit  CHIEF COMPLAINT:  Manage hypothyroidism, Parkinson's disease and BPH  HISTORY OF PRESENT ILLNESS:  REASSESSMENT OF ONGOING PROBLEM(S):  HYPOTHYROIDISM: The hypothyroidism remains stable. No complications noted from the medications presently being used.  The staff deny fatigue or constipation.  Last TSH in 10/14 TSH 8.27, 11-14 TSH 5.11, in 1-15 TSH 4.3. Patient is a poor historian.  BPH: The patient's BPH remains stable. Staff deny urinary hesitancy or dribbling. No complications reported from the current medications being used.  PARKINSON'S DISEASE: pt's Parkinson's disease is stable.  Staff Deny progression of sx recently.  Pt is tolerating Parkinson's disease medications without any complications.  PAST MEDICAL HISTORY : Reviewed.  No changes.  CURRENT MEDICATIONS: [facility]  REVIEW OF SYSTEMS: Difficult to obtain due to patient being a poor historian  PHYSICAL EXAMINATION  VS:  See VS section  GENERAL: no acute distress, thin body habitus EYES: Normal sclerae, normal conjunctivae, or discharge NECK: supple, trachea midline, no neck masses, no thyroid tenderness, no thyromegaly LYMPHATICS: No cervical lymphadenopathy, no supraclavicular lymphadenopathy RESPIRATORY: breathing is even & unlabored, BS CTAB CARDIAC: RRR, no murmur,no extra heart sounds, no edema GI: abdomen soft, normal BS, no masses, no tenderness, no hepatomegaly, no splenomegaly PSYCHIATRIC: the patient is alert & oriented to person, affect & behavior appropriate  LABS/RADIOLOGY:  10-14 CBC and CMP normal, Phenobarbital level 6  ASSESSMENT/PLAN:  Hypothyroidism-well-controlled Parkinson's disease-continue medications. BPH-well-controlled Anxiety-stable Constipation-well-controlled Insomnia-well-controlled Depression-on Zoloft. Check CBC and  CMP  CPT CODE: 1610999309  Newton PiggGayani Y. Kerry Doryasanayaka, MD [facility] [phone]

## 2013-08-10 ENCOUNTER — Other Ambulatory Visit: Payer: Self-pay | Admitting: *Deleted

## 2013-08-10 MED ORDER — CLONAZEPAM 0.5 MG PO TABS: ORAL_TABLET | ORAL | Status: DC

## 2013-08-10 NOTE — Telephone Encounter (Signed)
[facility] 

## 2013-08-29 ENCOUNTER — Non-Acute Institutional Stay (SKILLED_NURSING_FACILITY): Payer: PRIVATE HEALTH INSURANCE | Admitting: Internal Medicine

## 2013-08-29 DIAGNOSIS — N400 Enlarged prostate without lower urinary tract symptoms: Secondary | ICD-10-CM

## 2013-08-29 DIAGNOSIS — F411 Generalized anxiety disorder: Secondary | ICD-10-CM

## 2013-08-29 DIAGNOSIS — E039 Hypothyroidism, unspecified: Secondary | ICD-10-CM

## 2013-08-29 DIAGNOSIS — G20 Parkinson's disease: Secondary | ICD-10-CM

## 2013-08-29 NOTE — Progress Notes (Signed)
         PROGRESS NOTE  DATE: 08-29-13  FACILITY: Nursing Home [facility]  LEVEL OF CARE: SNF (31)  Routine Visit  CHIEF COMPLAINT:  Manage hypothyroidism, Parkinson's disease and BPH  HISTORY OF PRESENT ILLNESS:  REASSESSMENT OF ONGOING PROBLEM(S):  HYPOTHYROIDISM: The hypothyroidism remains stable. No complications noted from the medications presently being used.  The staff deny fatigue or constipation.  Last TSH in 10/14 TSH 8.27, 11-14 TSH 5.11, in 1-15 TSH 4.3, in 5-15 TSH 3.39. Patient is a poor historian.  BPH: The patient's BPH remains stable. Staff deny urinary hesitancy or dribbling. No complications reported from the current medications being used.  PARKINSON'S DISEASE: pt's Parkinson's disease is stable.  Staff Deny progression of sx recently.  Pt is tolerating Parkinson's disease medications without any complications.  PAST MEDICAL HISTORY : Reviewed.  No changes.  CURRENT MEDICATIONS: Reviewed per [facility]  REVIEW OF SYSTEMS: Difficult to obtain due to patient being a poor historian  PHYSICAL EXAMINATION  VS:  See VS section  GENERAL: no acute distress, thin body habitus EYES: Normal sclerae, normal conjunctivae, or discharge NECK: supple, trachea midline, no neck masses, no thyroid tenderness, no thyromegaly LYMPHATICS: No cervical lymphadenopathy, no supraclavicular lymphadenopathy RESPIRATORY: breathing is even & unlabored, BS CTAB CARDIAC: RRR, no murmur,no extra heart sounds, no edema GI: abdomen soft, normal BS, no masses, no tenderness, no hepatomegaly, no splenomegaly PSYCHIATRIC: the patient is alert & oriented to person, affect & behavior appropriate  LABS/RADIOLOGY: 5-15 HDL 38 otherwise fasting lipid panel normal, hemoglobin A1c 5.6 10-14 CBC and CMP normal, Phenobarbital level 6  ASSESSMENT/PLAN:  Hypothyroidism-well-controlled Parkinson's disease-continue  medications. BPH-well-controlled Anxiety-stable Constipation-well-controlled Insomnia-well-controlled Depression-on Zoloft.  CPT CODE: 21308  Newton Pigg. Kerry Dory, MD [facility] [phone]

## 2013-09-27 ENCOUNTER — Non-Acute Institutional Stay (SKILLED_NURSING_FACILITY): Payer: PRIVATE HEALTH INSURANCE | Admitting: Internal Medicine

## 2013-09-27 DIAGNOSIS — R509 Fever, unspecified: Secondary | ICD-10-CM

## 2013-09-27 DIAGNOSIS — I959 Hypotension, unspecified: Secondary | ICD-10-CM

## 2013-09-27 DIAGNOSIS — N179 Acute kidney failure, unspecified: Secondary | ICD-10-CM

## 2013-09-28 ENCOUNTER — Non-Acute Institutional Stay (SKILLED_NURSING_FACILITY): Payer: PRIVATE HEALTH INSURANCE | Admitting: Internal Medicine

## 2013-09-28 DIAGNOSIS — N10 Acute tubulo-interstitial nephritis: Secondary | ICD-10-CM

## 2013-10-01 ENCOUNTER — Non-Acute Institutional Stay (SKILLED_NURSING_FACILITY): Payer: PRIVATE HEALTH INSURANCE | Admitting: Internal Medicine

## 2013-10-01 DIAGNOSIS — D7289 Other specified disorders of white blood cells: Secondary | ICD-10-CM

## 2013-10-01 DIAGNOSIS — K5900 Constipation, unspecified: Secondary | ICD-10-CM

## 2013-10-01 DIAGNOSIS — E876 Hypokalemia: Secondary | ICD-10-CM

## 2013-10-01 DIAGNOSIS — N390 Urinary tract infection, site not specified: Secondary | ICD-10-CM

## 2013-10-02 DIAGNOSIS — E876 Hypokalemia: Secondary | ICD-10-CM | POA: Insufficient documentation

## 2013-10-02 DIAGNOSIS — K5900 Constipation, unspecified: Secondary | ICD-10-CM | POA: Insufficient documentation

## 2013-10-02 DIAGNOSIS — D7289 Other specified disorders of white blood cells: Secondary | ICD-10-CM | POA: Insufficient documentation

## 2013-10-02 NOTE — Progress Notes (Signed)
         PROGRESS NOTE  DATE: 10/01/2013  [facility]  LEVEL OF CARE: SNF (31)  Acute Visit  CHIEF COMPLAINT:  Manage UTI, hypokalemia and constipation  HISTORY OF PRESENT ILLNESS:   UTI: The UTI remains stable.  The patient denies ongoing suprapubic pain, flank pain, dysuria, urinary frequency, urinary hesitancy or hematuria.  No complications reported from the current antibiotic being used. Patient's urine culture on 7-8.-15 grew Escherichia coli. He is currently on rocephin.  HYPOKALEMIA: New problem. On 09-28-13 potassium was 3.4. Patient denies muscle cramps.  CONSTIPATION: The constipation remains stable. No complications from the medications presently being used. Patient denies ongoing constipation, abdominal pain, nausea or vomiting. The patient was impacted and had to be dis- impacted. He was started on MiraLax and Senokot.  PAST MEDICAL HISTORY : Reviewed.  No changes/see problem list  CURRENT MEDICATIONS: Reviewed per MAR/see medication list  REVIEW OF SYSTEMS:  GENERAL: no fatigue,no fever, chills or weakness RESPIRATORY: no cough, SOB, DOE,, wheezing, hemoptysis CARDIAC: no chest pain, edema or palpitations GI: no abdominal pain, diarrhea, constipation, heart burn, nausea or vomiting  PHYSICAL EXAMINATION  VS: see VS section  GENERAL: no acute distress, thin body habitus EYES: conjunctivae normal, sclerae normal, normal eye lids NECK: supple, trachea midline, no neck masses, no thyroid tenderness, no thyromegaly LYMPHATICS: no LAN in the neck, no supraclavicular LAN RESPIRATORY: breathing is even & unlabored, BS CTAB CARDIAC: RRR, no murmur,no extra heart sounds, no edema GI: abdomen soft, normal BS, no masses, no tenderness, no hepatomegaly, no splenomegaly PSYCHIATRIC: the patient is alert & oriented to person, depressed affect & behavior appropriate  LABS/RADIOLOGY:  09-26-13 liver profile normal 09-28-13 WBC 20.4, hemoglobin  11.2, MCV 90, platelets 106, glucose 102, BUN 30, potassium 3.4 otherwise BMP normal  ASSESSMENT/PLAN:  UTI-I agree with transition to Levaquin based on sensitivities. Hypokalemia-currently being repleted Constipation-status post disimpaction. Continue MiraLax and Senokot. Leukocytosis-secondary to UTI. Followup in one week. Anemia-able Thrombocytopenia-patient is asymptomatic  Discussed with [facility] NP.  CPT CODE: 7253699309  Angela CoxGayani Y Tomica Arseneault, MD [facility] [phone]

## 2013-10-03 ENCOUNTER — Non-Acute Institutional Stay (SKILLED_NURSING_FACILITY): Payer: PRIVATE HEALTH INSURANCE | Admitting: Internal Medicine

## 2013-10-03 DIAGNOSIS — R509 Fever, unspecified: Secondary | ICD-10-CM | POA: Insufficient documentation

## 2013-10-03 DIAGNOSIS — N390 Urinary tract infection, site not specified: Secondary | ICD-10-CM

## 2013-10-03 DIAGNOSIS — G20 Parkinson's disease: Secondary | ICD-10-CM

## 2013-10-03 DIAGNOSIS — N400 Enlarged prostate without lower urinary tract symptoms: Secondary | ICD-10-CM

## 2013-10-03 DIAGNOSIS — G20A1 Parkinson's disease without dyskinesia, without mention of fluctuations: Secondary | ICD-10-CM

## 2013-10-03 DIAGNOSIS — E039 Hypothyroidism, unspecified: Secondary | ICD-10-CM

## 2013-10-03 NOTE — Progress Notes (Signed)
Patient ID: Alante Tolan, male   DOB: [birth year], 73 y.o.   MRN: [id number]                PROGRESS NOTE  DATE: 09/27/2013        FACILITY: [facility]  LEVEL OF CARE: SNF (31)  Acute Visit    ALLERGIES:  Allergies  Allergen Reactions  . Demerol [Meperidine]   . Lactose Intolerance (Gi)   . Nickel   . Oxycodone     CHIEF COMPLAINT:  Manage fever, hypotension.    HISTORY OF PRESENT ILLNESS:  [facility] practitioner notified me that the patient had spiked a fever yesterday.  Blood pressure was 80/60.  Temperature was 101.2.  Patient overall is a poor historian.  However, he denies a cough or shortness of breath.     PAST MEDICAL HISTORY :  Past Medical History  Diagnosis Date  . Arthritis   . Depression   . Dysphagia   . UTI (lower urinary tract infection)   . Ataxia   . Thyroid disease   . Anxiety   . Cauda equina syndrome   . Essential Tremors     PAST SURGICAL HISTORY: Past Surgical History  Procedure Laterality Date  . Laminectomy    . Spine surgery      SOCIAL HISTORY:  reports that he has quit smoking. He does not have any smokeless tobacco history on file.  FAMILY HISTORY: None per record  CURRENT MEDICATIONS: Reviewed per MAR/see medication list  REVIEW OF SYSTEMS:  Difficult to obtain since patient is a poor historian.    PHYSICAL EXAMINATION  VS:  T 101.2       P 72      RR 22      BP 80/60     POX 95%       WT (Lb) 112            GENERAL: no acute distress, thin body habitus EYES: conjunctivae normal, sclerae normal, normal eye lids MOUTH/THROAT: lips without lesions, unable to assess oropharynx NECK: supple, trachea midline, no neck masses, no thyroid tenderness, no thyromegaly LYMPHATICS: no LAN in the neck, no supraclavicular LAN RESPIRATORY: breathing is even & unlabored, BS CTAB CARDIAC: RRR, no murmur,no extra heart sounds, no edema GI:  ABDOMEN: abdomen soft, normal BS, no masses, no tenderness  LIVER/SPLEEN: no  hepatomegaly, no splenomegaly MUSCULOSKELETAL: unable to assess, patient not following commands well        PSYCHIATRIC: the patient is alert & oriented to person, decreased affect and mood       LABS/RADIOLOGY: On 09/27/2013:  WBC 30.4, hemoglobin 12.3, MCV 88, platelets 108.    Glucose 120, BUN 43, creatinine 1.31, otherwise BMP normal.    On 09/26/2013:  WBC 2.3, hemoglobin 15, platelets 143.     BUN 29, creatinine 1.16, glucose 73, sodium 146, otherwise CMP normal.    Urinalysis shows cloudy appearance, +2 WBC esterase, nitrite positive, WBCs greater than 30, RBCs 0-2.   Urine culture pending.    ASSESSMENT/PLAN:  Fever and leukocytosis.  Patient's chest x-ray is negative.  He does have pyuria.  We will follow up on urine culture.  Start ceftriaxone 1 g IV q.d. for six more days.  One dose was given yesterday.  Also, give probiotics b.i.d. for 10 days.   Recheck CBC with diff tomorrow.    Acute renal failure/dehydration.  He is currently on D5 half-normal saline at 100 cc/hr.   We will continue  that for another 24 hours and recheck renal functions.    Hypotension.  Follow up with IV fluids.    Thrombocytopenia.  Platelet count is dropping further.  Therefore, recheck.    Anemia.  We will recheck.    The patient's management was discussed with [facility] staff.    I have reviewed patient's medical records received at admission/from hospitalization.   CPT CODE: [id number], [id number], [id number].    Angela CoxGayani Y Dasanayaka, MD [facility] [phone]

## 2013-10-03 NOTE — Progress Notes (Addendum)
Patient ID: Kristopher Jennings, male   DOB: Jan 09, 1941, 73 y.o.   MRN: [id number]               PROGRESS NOTE  DATE:  09/28/2013    [facility] [facility] [place] [facility] [place]       LEVEL OF CARE:   SNF   Acute Visit   CHIEF COMPLAINT:  Follow up pyelonephritis.    HISTORY OF PRESENT ILLNESS:  This patient became ill earlier this week.  He  apparently had episodes of vomiting.  Lab work from 09/27/2013 showed a white count of 30.4 with 92% neutrophils.  His BUN was 43, creatinine 1.31.  He was given IV fluid and started on Rocephin for a presumed pyelonephritis.  Urine has come back showing greater than 100,000 E.coli which is sensitive to Rocephin.  Actually, it has oral sensitivities, as well.  However, I will continue the Rocephin over the weekend.    Lab work from today has come back showing a white count of 20.4.   Platelet count is 106.  His basic metabolic panel shows a sodium of 139, potassium 3.4, BUN 30, creatinine 0.73.    REVIEW OF SYSTEMS:   GENERAL:   The patient states he feels much better than two days ago.   CHEST/RESPIRATORY:  No cough.  No sputum.   CARDIAC:   No chest pain.   GI:  No further vomiting.   GU:  He does not really describe dysuria, although I am wondering whether there is some degree of urinary retention.    PHYSICAL EXAMINATION:   GENERAL APPEARANCE:  The patient looks stable.  He has no complaints.   CHEST/RESPIRATORY:  Clear air entry bilaterally.   CARDIOVASCULAR:  CARDIAC:   Heart sounds are normal.  He appears to be euvolemic.    GASTROINTESTINAL:  ABDOMEN:   Soft.   LIVER/SPLEEN/KIDNEYS:  No liver, no spleen.  No tenderness.   GENITOURINARY:  BLADDER:   There is some suprapubic fullness.  However, there is no CVA tenderness.    ASSESSMENT/PLAN:  E.coli pyelonephritis.  I think we can continue the Rocephin over the weekend.  He probably could be switched to oral antibiotics next week.  I want to ensure stability.    Mild acute renal failure.   This appears to be resolving with IV fluid.  He probably has a free fluid kaluresis.  I have given him some potassium.    Thrombocytopenia.  At this point, I am going to follow this.  This is probably gram-negative sepsis.

## 2013-10-04 DIAGNOSIS — E039 Hypothyroidism, unspecified: Secondary | ICD-10-CM | POA: Insufficient documentation

## 2013-10-04 NOTE — Progress Notes (Signed)
         PROGRESS NOTE  DATE: 10-03-13  [facility]  LEVEL OF CARE: SNF (31)  Routine Visit  CHIEF COMPLAINT:  Manage hypothyroidism, Parkinson's disease and BPH  HISTORY OF PRESENT ILLNESS:  REASSESSMENT OF ONGOING PROBLEM(S):  HYPOTHYROIDISM: The hypothyroidism remains stable. No complications noted from the medications presently being used.  The staff deny fatigue or constipation.  Last TSH in 10/14 TSH 8.27, 11-14 TSH 5.11, in 1-15 TSH 4.3, in 5-15 TSH 3.39. Patient is a poor historian.  BPH: The patient's BPH remains stable. Staff deny urinary hesitancy or dribbling. No complications reported from the current medications being used.  PARKINSON'S DISEASE: pt's Parkinson's disease is stable.  Staff Deny progression of sx recently.  Pt is tolerating Parkinson's disease medications without any complications.  PAST MEDICAL HISTORY : Reviewed.  No changes.  CURRENT MEDICATIONS: [facility]  REVIEW OF SYSTEMS: Difficult to obtain due to patient being a poor historian  PHYSICAL EXAMINATION  VS:  See VS section  GENERAL: no acute distress, thin body habitus EYES: Normal sclerae, normal conjunctivae, or discharge NECK: supple, trachea midline, no neck masses, no thyroid tenderness, no thyromegaly LYMPHATICS: No cervical lymphadenopathy, no supraclavicular lymphadenopathy RESPIRATORY: breathing is even & unlabored, BS CTAB CARDIAC: RRR, no murmur,no extra heart sounds, no edema GI: abdomen soft, normal BS, no masses, no tenderness, no hepatomegaly, no splenomegaly PSYCHIATRIC: the patient is alert & oriented to person, depressed affect & behavior appropriate  LABS/RADIOLOGY: 7-15 WBC 20.4, hemoglobin 11.2, MCV 90, platelets 106, ANC 17.6, potassium 3.4 otherwise BMP normal, and liver profile normal 5-15 HDL 38 otherwise fasting lipid panel normal, hemoglobin A1c 5.6 10-14 CBC and CMP normal, Phenobarbital level  6  ASSESSMENT/PLAN:  Hypothyroidism-well-controlled Parkinson's disease-continue medications. BPH-well-controlled UTI-on Levaquin Hypokalemia-on supplementation Leukocytosis with left shift-secondary to UTI. Recheck pending. Thrombocytopenia-monitor Anxiety-stable Constipation-well-controlled Insomnia-well-controlled Depression-on Wellbutrin. Psychosis-Seroquel increased  CPT CODE: 1610999309  Newton PiggGayani Y. Kerry Doryasanayaka, MD [facility] [phone]

## 2013-10-09 ENCOUNTER — Other Ambulatory Visit: Payer: Self-pay | Admitting: *Deleted

## 2013-10-09 MED ORDER — CLONAZEPAM 1 MG PO TABS: ORAL_TABLET | ORAL | Status: DC

## 2013-10-09 NOTE — Telephone Encounter (Signed)
[facility] 

## 2013-11-05 ENCOUNTER — Non-Acute Institutional Stay (SKILLED_NURSING_FACILITY): Payer: PRIVATE HEALTH INSURANCE | Admitting: Internal Medicine

## 2013-11-05 DIAGNOSIS — E039 Hypothyroidism, unspecified: Secondary | ICD-10-CM

## 2013-11-05 DIAGNOSIS — N400 Enlarged prostate without lower urinary tract symptoms: Secondary | ICD-10-CM

## 2013-11-05 DIAGNOSIS — E876 Hypokalemia: Secondary | ICD-10-CM

## 2013-11-05 DIAGNOSIS — G20 Parkinson's disease: Secondary | ICD-10-CM

## 2013-11-05 DIAGNOSIS — G20A1 Parkinson's disease without dyskinesia, without mention of fluctuations: Secondary | ICD-10-CM

## 2013-11-06 NOTE — Progress Notes (Signed)
         PROGRESS NOTE  DATE: 11-05-13  FACILITY[facility]  LEVEL OF CARE: SNF (31)  Routine Visit  CHIEF COMPLAINT:  Manage hypothyroidism, Parkinson's disease and BPH  HISTORY OF PRESENT ILLNESS:  REASSESSMENT OF ONGOING PROBLEM(S):  HYPOTHYROIDISM: The hypothyroidism remains stable. No complications noted from the medications presently being used.  The staff deny fatigue or constipation.  Last TSH in 10/14 TSH 8.27, 11-14 TSH 5.11, in 1-15 TSH 4.3, in 5-15 TSH 3.39. Patient is a poor historian.  BPH: The patient's BPH remains stable. Staff deny urinary hesitancy or dribbling. No complications reported from the current medications being used.  PARKINSON'S DISEASE: pt's Parkinson's disease is stable.  Staff Deny progression of sx recently.  Pt is tolerating Parkinson's disease medications without any complications.  PAST MEDICAL HISTORY : Reviewed.  No changes.  CURRENT MEDICATIONS: [facility] per [facility]  REVIEW OF SYSTEMS: Difficult to obtain due to patient being a poor historian  PHYSICAL EXAMINATION  VS:  See VS section  GENERAL: no acute distress, thin body habitus EYES: Normal sclerae, normal conjunctivae, or discharge NECK: supple, trachea midline, no neck masses, no thyroid tenderness, no thyromegaly LYMPHATICS: No cervical lymphadenopathy, no supraclavicular lymphadenopathy RESPIRATORY: breathing is even & unlabored, BS CTAB CARDIAC: RRR, no murmur,no extra heart sounds, no edema GI: abdomen soft, normal BS, no masses, no tenderness, no hepatomegaly, no splenomegaly PSYCHIATRIC: the patient is alert & oriented to person, depressed affect & behavior appropriate  LABS/RADIOLOGY: 7-15 WBC 20.4, hemoglobin 11.2, MCV 90, platelets 106, ANC 17.6, potassium 3.4 otherwise BMP normal, and liver profile normal, repeat CBC normal 5-15 HDL 38 otherwise fasting lipid panel normal, hemoglobin A1c 5.6 10-14 CBC and CMP normal, Phenobarbital  level 6  ASSESSMENT/PLAN:  Hypothyroidism-well-controlled Parkinson's disease-continue medications. BPH-well-controlled Hypokalemia-on supplementation Anxiety-stable Constipation-well-controlled Insomnia-well-controlled Depression-on Wellbutrin. Psychosis-stable Elevated blood pressure-will review a log  CPT CODE: [id number]  Newton PiggGayani Y. Kerry Doryasanayaka, MD [facility] [phone]

## 2013-11-12 ENCOUNTER — Encounter: Payer: Self-pay | Admitting: *Deleted

## 2013-12-05 ENCOUNTER — Non-Acute Institutional Stay (SKILLED_NURSING_FACILITY): Payer: PRIVATE HEALTH INSURANCE | Admitting: Internal Medicine

## 2013-12-05 DIAGNOSIS — N400 Enlarged prostate without lower urinary tract symptoms: Secondary | ICD-10-CM

## 2013-12-05 DIAGNOSIS — G20 Parkinson's disease: Secondary | ICD-10-CM

## 2013-12-05 DIAGNOSIS — E876 Hypokalemia: Secondary | ICD-10-CM

## 2013-12-05 DIAGNOSIS — E039 Hypothyroidism, unspecified: Secondary | ICD-10-CM

## 2013-12-06 NOTE — Progress Notes (Signed)
         PROGRESS NOTE  DATE: 12-05-13  FACILITY: Nursing Home [facility]  LEVEL OF CARE: SNF (31)  Routine Visit  CHIEF COMPLAINT:  Manage hypothyroidism, Parkinson's disease and BPH  HISTORY OF PRESENT ILLNESS:  REASSESSMENT OF ONGOING PROBLEM(S):  HYPOTHYROIDISM: The hypothyroidism remains stable. No complications noted from the medications presently being used.  The staff deny fatigue or constipation.  Last TSH in 10/14 TSH 8.27, 11-14 TSH 5.11, in 1-15 TSH 4.3, in 5-15 TSH 3.39. Patient is a poor historian.  BPH: The patient's BPH remains stable. Staff deny urinary hesitancy or dribbling. No complications reported from the current medications being used.  PARKINSON'S DISEASE: pt's Parkinson's disease is stable.  Staff Deny progression of sx recently.  Pt is tolerating Parkinson's disease medications without any complications.  PAST MEDICAL HISTORY : Reviewed.  No changes.  CURRENT MEDICATIONS: Reviewed per [facility]  REVIEW OF SYSTEMS: Difficult to obtain due to patient being a poor historian  PHYSICAL EXAMINATION  VS:  See VS section  GENERAL: no acute distress, thin body habitus EYES: Normal sclerae, normal conjunctivae, or discharge NECK: supple, trachea midline, no neck masses, no thyroid tenderness, no thyromegaly LYMPHATICS: No cervical lymphadenopathy, no supraclavicular lymphadenopathy RESPIRATORY: breathing is even & unlabored, BS CTAB CARDIAC: RRR, no murmur,no extra heart sounds, no edema GI: abdomen soft, normal BS, no masses, no tenderness, no hepatomegaly, no splenomegaly PSYCHIATRIC: the patient is alert & oriented to person, depressed affect & behavior appropriate  LABS/RADIOLOGY: 7-15 WBC 20.4, hemoglobin 11.2, MCV 90, platelets 106, ANC 17.6, potassium 3.4 otherwise BMP normal, and liver profile normal, repeat CBC normal 5-15 HDL 38 otherwise fasting lipid panel normal, hemoglobin A1c 5.6 10-14 CBC and CMP normal, Phenobarbital  level 6  ASSESSMENT/PLAN:  Hypothyroidism-well-controlled Parkinson's disease-continue medications. BPH-well-controlled Hypokalemia-on supplementation Anxiety-stable Constipation-well-controlled. Discontinue MiraLax per patient request. Insomnia-well-controlled Depression-Remeron was started. Psychosis-stable  CPT CODE: 86578  Kristopher Jennings. Kerry Dory, MD [facility] [phone]

## 2013-12-31 ENCOUNTER — Non-Acute Institutional Stay (SKILLED_NURSING_FACILITY): Payer: PRIVATE HEALTH INSURANCE | Admitting: Internal Medicine

## 2013-12-31 DIAGNOSIS — E876 Hypokalemia: Secondary | ICD-10-CM

## 2013-12-31 DIAGNOSIS — N400 Enlarged prostate without lower urinary tract symptoms: Secondary | ICD-10-CM

## 2013-12-31 DIAGNOSIS — G20 Parkinson's disease: Secondary | ICD-10-CM

## 2013-12-31 DIAGNOSIS — E039 Hypothyroidism, unspecified: Secondary | ICD-10-CM

## 2014-01-01 DIAGNOSIS — N400 Enlarged prostate without lower urinary tract symptoms: Secondary | ICD-10-CM | POA: Insufficient documentation

## 2014-01-01 NOTE — Progress Notes (Signed)
         PROGRESS NOTE  DATE: 12-31-13  FACILITY: Nursing Home Location: [facility]  LEVEL OF CARE: SNF (31)  Routine Visit  CHIEF COMPLAINT:  Manage hypothyroidism, Parkinson's disease and BPH  HISTORY OF PRESENT ILLNESS:  REASSESSMENT OF ONGOING PROBLEM(S):  HYPOTHYROIDISM: The hypothyroidism remains stable. No complications noted from the medications presently being used.  The staff deny fatigue or constipation.  Last TSH in 10/14 TSH 8.27, 11-14 TSH 5.11, in 1-15 TSH 4.3, in 5-15 TSH 3.39. Patient is a poor historian.  BPH: The patient's BPH remains stable. Staff deny urinary hesitancy or dribbling. No complications reported from the current medications being used.  PARKINSON'S DISEASE: pt's Parkinson's disease is stable.  Staff Deny progression of sx recently.  Pt is tolerating Parkinson's disease medications without any complications.  PAST MEDICAL HISTORY : Reviewed.  No changes.  CURRENT MEDICATIONS: [facility]  REVIEW OF SYSTEMS: Difficult to obtain due to patient being a poor historian  PHYSICAL EXAMINATION  VS:  See VS section  GENERAL: no acute distress, thin body habitus EYES: Normal sclerae, normal conjunctivae, or discharge NECK: supple, trachea midline, no neck masses, no thyroid tenderness, no thyromegaly LYMPHATICS: No cervical lymphadenopathy, no supraclavicular lymphadenopathy RESPIRATORY: breathing is even & unlabored, BS CTAB CARDIAC: RRR, no murmur,no extra heart sounds, no edema GI: abdomen soft, normal BS, no masses, no tenderness, no hepatomegaly, no splenomegaly PSYCHIATRIC: the patient is alert & oriented to person, depressed affect & behavior appropriate  LABS/RADIOLOGY: 7-15 WBC 20.4, hemoglobin 11.2, MCV 90, platelets 106, ANC 17.6, potassium 3.4 otherwise BMP normal, and liver profile normal, repeat CBC normal 5-15 HDL 38 otherwise fasting lipid panel normal, hemoglobin A1c 5.6 10-14 CBC and CMP normal, Phenobarbital  level 6  ASSESSMENT/PLAN:  Hypothyroidism-well-controlled Parkinson's disease-continue medications. BPH-well-controlled Hypokalemia-on supplementation Anxiety-stable Constipation-well-controlled.  Insomnia-well-controlled Depression-on Remeron  Psychosis-stable  CPT CODE: [id number]  Newton PiggGayani Y. Kerry Doryasanayaka, MD [facility] [phone]

## 2014-03-13 ENCOUNTER — Encounter: Payer: Self-pay | Admitting: *Deleted

## 2015-10-16 ENCOUNTER — Other Ambulatory Visit (HOSPITAL_COMMUNITY): Payer: Self-pay | Admitting: Internal Medicine

## 2015-10-16 DIAGNOSIS — R1310 Dysphagia, unspecified: Secondary | ICD-10-CM

## 2015-10-27 ENCOUNTER — Ambulatory Visit (HOSPITAL_COMMUNITY)
Admission: RE | Admit: 2015-10-27 | Discharge: 2015-10-27 | Disposition: A | Discharge status: Home / Self Care | Payer: Medicare Other | Source: Ambulatory Visit | Attending: Internal Medicine | Admitting: Internal Medicine

## 2015-10-27 ENCOUNTER — Ambulatory Visit (HOSPITAL_COMMUNITY)
Admission: RE | Admit: 2015-10-27 | Discharge: 2015-10-27 | Disposition: A | Discharge status: Home / Self Care | Payer: Medicaid Other | Source: Ambulatory Visit | Attending: Internal Medicine | Admitting: Internal Medicine

## 2015-10-27 DIAGNOSIS — R29810 Facial weakness: Secondary | ICD-10-CM | POA: Insufficient documentation

## 2015-10-27 DIAGNOSIS — R1310 Dysphagia, unspecified: Secondary | ICD-10-CM

## 2015-10-27 DIAGNOSIS — E039 Hypothyroidism, unspecified: Secondary | ICD-10-CM | POA: Diagnosis not present

## 2015-10-27 DIAGNOSIS — R4182 Altered mental status, unspecified: Secondary | ICD-10-CM | POA: Insufficient documentation

## 2015-10-27 DIAGNOSIS — F419 Anxiety disorder, unspecified: Secondary | ICD-10-CM | POA: Diagnosis not present

## 2015-10-27 DIAGNOSIS — Z87440 Personal history of urinary (tract) infections: Secondary | ICD-10-CM | POA: Insufficient documentation

## 2015-10-27 DIAGNOSIS — F329 Major depressive disorder, single episode, unspecified: Secondary | ICD-10-CM | POA: Insufficient documentation

## 2015-10-27 DIAGNOSIS — E079 Disorder of thyroid, unspecified: Secondary | ICD-10-CM | POA: Insufficient documentation

## 2015-10-27 DIAGNOSIS — G834 Cauda equina syndrome: Secondary | ICD-10-CM | POA: Insufficient documentation

## 2015-10-27 DIAGNOSIS — G20 Parkinson's disease: Secondary | ICD-10-CM | POA: Diagnosis present

## 2015-10-27 DIAGNOSIS — G250 Essential tremor: Secondary | ICD-10-CM | POA: Diagnosis not present

## 2015-10-27 DIAGNOSIS — R1312 Dysphagia, oropharyngeal phase: Secondary | ICD-10-CM | POA: Diagnosis not present

## 2015-10-27 DIAGNOSIS — I679 Cerebrovascular disease, unspecified: Secondary | ICD-10-CM | POA: Insufficient documentation

## 2015-10-27 NOTE — Progress Notes (Signed)
MBSS complete. Full report located under chart review in imaging section.  Chamberlain Steinborn Paiewonsky, M.A. CCC-SLP [phone]  

## 2016-03-19 ENCOUNTER — Inpatient Hospital Stay (HOSPITAL_COMMUNITY)
Admission: EM | Admit: 2016-03-19 | Discharge: 2016-03-23 | DRG: 871 | Disposition: A | Discharge status: Skilled Nursing Facility | Payer: Medicare Other | Attending: Family Medicine | Admitting: Family Medicine

## 2016-03-19 ENCOUNTER — Encounter (HOSPITAL_COMMUNITY): Payer: Self-pay | Admitting: *Deleted

## 2016-03-19 ENCOUNTER — Emergency Department (HOSPITAL_COMMUNITY): Payer: Medicare Other

## 2016-03-19 DIAGNOSIS — R740 Nonspecific elevation of levels of transaminase and lactic acid dehydrogenase [LDH]: Secondary | ICD-10-CM | POA: Diagnosis present

## 2016-03-19 DIAGNOSIS — R0602 Shortness of breath: Secondary | ICD-10-CM | POA: Diagnosis not present

## 2016-03-19 DIAGNOSIS — Z8673 Personal history of transient ischemic attack (TIA), and cerebral infarction without residual deficits: Secondary | ICD-10-CM | POA: Diagnosis not present

## 2016-03-19 DIAGNOSIS — F329 Major depressive disorder, single episode, unspecified: Secondary | ICD-10-CM | POA: Diagnosis present

## 2016-03-19 DIAGNOSIS — E079 Disorder of thyroid, unspecified: Secondary | ICD-10-CM | POA: Diagnosis present

## 2016-03-19 DIAGNOSIS — A419 Sepsis, unspecified organism: Principal | ICD-10-CM | POA: Diagnosis present

## 2016-03-19 DIAGNOSIS — Z885 Allergy status to narcotic agent status: Secondary | ICD-10-CM | POA: Diagnosis not present

## 2016-03-19 DIAGNOSIS — J690 Pneumonitis due to inhalation of food and vomit: Secondary | ICD-10-CM | POA: Diagnosis not present

## 2016-03-19 DIAGNOSIS — R600 Localized edema: Secondary | ICD-10-CM | POA: Diagnosis present

## 2016-03-19 DIAGNOSIS — Z87891 Personal history of nicotine dependence: Secondary | ICD-10-CM

## 2016-03-19 DIAGNOSIS — Z7189 Other specified counseling: Secondary | ICD-10-CM

## 2016-03-19 DIAGNOSIS — E870 Hyperosmolality and hypernatremia: Secondary | ICD-10-CM | POA: Diagnosis present

## 2016-03-19 DIAGNOSIS — Z91048 Other nonmedicinal substance allergy status: Secondary | ICD-10-CM

## 2016-03-19 DIAGNOSIS — Z79899 Other long term (current) drug therapy: Secondary | ICD-10-CM

## 2016-03-19 DIAGNOSIS — Z515 Encounter for palliative care: Secondary | ICD-10-CM

## 2016-03-19 DIAGNOSIS — E876 Hypokalemia: Secondary | ICD-10-CM | POA: Diagnosis present

## 2016-03-19 DIAGNOSIS — L8990 Pressure ulcer of unspecified site, unspecified stage: Secondary | ICD-10-CM | POA: Insufficient documentation

## 2016-03-19 DIAGNOSIS — G9340 Encephalopathy, unspecified: Secondary | ICD-10-CM | POA: Diagnosis present

## 2016-03-19 DIAGNOSIS — E739 Lactose intolerance, unspecified: Secondary | ICD-10-CM | POA: Diagnosis present

## 2016-03-19 DIAGNOSIS — F419 Anxiety disorder, unspecified: Secondary | ICD-10-CM | POA: Diagnosis present

## 2016-03-19 DIAGNOSIS — R1313 Dysphagia, pharyngeal phase: Secondary | ICD-10-CM | POA: Diagnosis present

## 2016-03-19 DIAGNOSIS — Z66 Do not resuscitate: Secondary | ICD-10-CM | POA: Diagnosis present

## 2016-03-19 DIAGNOSIS — J181 Lobar pneumonia, unspecified organism: Secondary | ICD-10-CM | POA: Diagnosis not present

## 2016-03-19 DIAGNOSIS — G20A1 Parkinson's disease without dyskinesia, without mention of fluctuations: Secondary | ICD-10-CM | POA: Diagnosis present

## 2016-03-19 DIAGNOSIS — G20 Parkinson's disease: Secondary | ICD-10-CM | POA: Diagnosis not present

## 2016-03-19 DIAGNOSIS — K859 Acute pancreatitis without necrosis or infection, unspecified: Secondary | ICD-10-CM | POA: Insufficient documentation

## 2016-03-19 HISTORY — DX: Parkinson's disease: G20

## 2016-03-19 LAB — COMPREHENSIVE METABOLIC PANEL
ALBUMIN: 2.7 g/dL — AB (ref 3.5–5.0)
ALK PHOS: 57 U/L (ref 38–126)
ALT: 73 U/L — AB (ref 17–63)
ANION GAP: 10 (ref 5–15)
AST: 43 U/L — ABNORMAL HIGH (ref 15–41)
BUN: 28 mg/dL — ABNORMAL HIGH (ref 6–20)
CALCIUM: 7.5 mg/dL — AB (ref 8.9–10.3)
CO2: 22 mmol/L (ref 22–32)
CREATININE: 1.19 mg/dL (ref 0.61–1.24)
Chloride: 115 mmol/L — ABNORMAL HIGH (ref 101–111)
GFR calc non Af Amer: 59 mL/min — ABNORMAL LOW (ref >60)
GLUCOSE: 145 mg/dL — AB (ref 65–99)
Potassium: 3.1 mmol/L — ABNORMAL LOW (ref 3.5–5.1)
Sodium: 147 mmol/L — ABNORMAL HIGH (ref 135–145)
TOTAL PROTEIN: 6.5 g/dL (ref 6.5–8.1)
Total Bilirubin: 0.6 mg/dL (ref 0.3–1.2)

## 2016-03-19 LAB — CBC WITH DIFFERENTIAL/PLATELET
BASOS ABS: 0 10*3/uL (ref 0.0–0.1)
BASOS PCT: 0 %
EOS ABS: 0 10*3/uL (ref 0.0–0.7)
Eosinophils Relative: 0 %
HEMATOCRIT: 38.2 % — AB (ref 39.0–52.0)
Hemoglobin: 12.5 g/dL — ABNORMAL LOW (ref 13.0–17.0)
Lymphocytes Relative: 15 %
Lymphs Abs: 1.1 10*3/uL (ref 0.7–4.0)
MCH: 30.3 pg (ref 26.0–34.0)
MCHC: 32.7 g/dL (ref 30.0–36.0)
MCV: 92.5 fL (ref 78.0–100.0)
MONO ABS: 1.3 10*3/uL — AB (ref 0.1–1.0)
MONOS PCT: 17 %
NEUTROS ABS: 5.1 10*3/uL (ref 1.7–7.7)
Neutrophils Relative %: 68 %
PLATELETS: 140 10*3/uL — AB (ref 150–400)
RBC: 4.13 MIL/uL — ABNORMAL LOW (ref 4.22–5.81)
RDW: 14.4 % (ref 11.5–15.5)
WBC: 7.5 10*3/uL (ref 4.0–10.5)

## 2016-03-19 LAB — URINALYSIS, ROUTINE W REFLEX MICROSCOPIC
BACTERIA UA: NONE SEEN
Bilirubin Urine: NEGATIVE
GLUCOSE, UA: NEGATIVE mg/dL
Ketones, ur: NEGATIVE mg/dL
NITRITE: NEGATIVE
PH: 8 (ref 5.0–8.0)
PROTEIN: 100 mg/dL — AB
SPECIFIC GRAVITY, URINE: 1.019 (ref 1.005–1.030)

## 2016-03-19 LAB — BLOOD GAS, ARTERIAL
Acid-Base Excess: 0 mmol/L (ref 0.0–2.0)
Bicarbonate: 23 mmol/L (ref 20.0–28.0)
DRAWN BY: 331471
FIO2: 100
O2 SAT: 99.3 %
PATIENT TEMPERATURE: 98.6
PO2 ART: 219 mmHg — AB (ref 83.0–108.0)
pCO2 arterial: 33.8 mmHg (ref 32.0–48.0)
pH, Arterial: 7.448 (ref 7.350–7.450)

## 2016-03-19 LAB — PROTIME-INR
INR: 1.31
Prothrombin Time: 16.4 seconds — ABNORMAL HIGH (ref 11.4–15.2)

## 2016-03-19 LAB — I-STAT CG4 LACTIC ACID, ED
LACTIC ACID, VENOUS: 1.29 mmol/L (ref 0.5–1.9)
Lactic Acid, Venous: 1.22 mmol/L (ref 0.5–1.9)

## 2016-03-19 LAB — MRSA PCR SCREENING: MRSA by PCR: NEGATIVE

## 2016-03-19 LAB — I-STAT TROPONIN, ED: Troponin i, poc: 0.02 ng/mL (ref 0.00–0.08)

## 2016-03-19 LAB — INFLUENZA PANEL BY PCR (TYPE A & B)
INFLAPCR: NEGATIVE
INFLBPCR: NEGATIVE

## 2016-03-19 LAB — APTT: APTT: 32 s (ref 24–36)

## 2016-03-19 MED ORDER — LEVOTHYROXINE SODIUM 75 MCG PO TABS: 175.0000 ug | ORAL_TABLET | Freq: Every day | ORAL | Status: DC

## 2016-03-19 MED ORDER — PIPERACILLIN-TAZOBACTAM 3.375 G IVPB
3.3750 g | Freq: Once | INTRAVENOUS | Status: AC
  Administered 2016-03-19: 3.375 g via INTRAVENOUS
  Filled 2016-03-19: qty 50

## 2016-03-19 MED ORDER — TAMSULOSIN HCL 0.4 MG PO CAPS: 0.8000 mg | ORAL_CAPSULE | Freq: Every day | ORAL | Status: DC

## 2016-03-19 MED ORDER — QUETIAPINE FUMARATE 50 MG PO TABS: 50.0000 mg | ORAL_TABLET | Freq: Two times a day (BID) | ORAL | Status: DC

## 2016-03-19 MED ORDER — SERTRALINE HCL 100 MG PO TABS: 100.0000 mg | ORAL_TABLET | Freq: Every day | ORAL | Status: DC

## 2016-03-19 MED ORDER — KCL IN DEXTROSE-NACL 40-5-0.45 MEQ/L-%-% IV SOLN
INTRAVENOUS | Status: DC
  Administered 2016-03-19 – 2016-03-21 (×4): via INTRAVENOUS
  Filled 2016-03-19 (×5): qty 1000

## 2016-03-19 MED ORDER — POLYETHYLENE GLYCOL 3350 17 G PO PACK: 17.0000 g | PACK | Freq: Every day | ORAL | Status: DC | PRN

## 2016-03-19 MED ORDER — ACETAMINOPHEN 650 MG RE SUPP: 650.0000 mg | Freq: Four times a day (QID) | RECTAL | Status: DC | PRN

## 2016-03-19 MED ORDER — LORAZEPAM 2 MG/ML IJ SOLN: 0.5000 mg | Freq: Three times a day (TID) | INTRAMUSCULAR | Status: DC | PRN

## 2016-03-19 MED ORDER — SODIUM CHLORIDE 0.9% FLUSH
3.0000 mL | Freq: Two times a day (BID) | INTRAVENOUS | Status: DC
  Administered 2016-03-19 – 2016-03-22 (×6): 3 mL via INTRAVENOUS

## 2016-03-19 MED ORDER — CLONAZEPAM 1 MG PO TABS: 1.0000 mg | ORAL_TABLET | Freq: Every evening | ORAL | Status: DC | PRN

## 2016-03-19 MED ORDER — ACETAMINOPHEN 325 MG PO TABS
650.0000 mg | ORAL_TABLET | Freq: Four times a day (QID) | ORAL | Status: DC | PRN
  Administered 2016-03-21 – 2016-03-22 (×2): 650 mg via ORAL
  Filled 2016-03-19 (×2): qty 2

## 2016-03-19 MED ORDER — SODIUM CHLORIDE 0.9 % IV BOLUS (SEPSIS)
1000.0000 mL | Freq: Once | INTRAVENOUS | Status: AC
  Administered 2016-03-19: 1000 mL via INTRAVENOUS

## 2016-03-19 MED ORDER — VANCOMYCIN HCL IN DEXTROSE 1-5 GM/200ML-% IV SOLN
1000.0000 mg | Freq: Once | INTRAVENOUS | Status: AC
  Administered 2016-03-19: 1000 mg via INTRAVENOUS
  Filled 2016-03-19: qty 200

## 2016-03-19 MED ORDER — CLONAZEPAM 0.5 MG PO TABS: 0.5000 mg | ORAL_TABLET | Freq: Every day | ORAL | Status: DC

## 2016-03-19 MED ORDER — SODIUM CHLORIDE 0.9 % IV SOLN: INTRAVENOUS | Status: DC

## 2016-03-19 MED ORDER — POTASSIUM CHLORIDE 20 MEQ PO PACK
40.0000 meq | PACK | Freq: Once | ORAL | Status: DC
  Filled 2016-03-19: qty 2

## 2016-03-19 MED ORDER — DEXTROSE 5 % IV SOLN
2.0000 g | Freq: Once | INTRAVENOUS | Status: DC
  Filled 2016-03-19: qty 2

## 2016-03-19 MED ORDER — PIPERACILLIN-TAZOBACTAM 3.375 G IVPB
3.3750 g | Freq: Three times a day (TID) | INTRAVENOUS | Status: DC
Start: 2016-03-19 — End: 2016-03-23
  Administered 2016-03-19 – 2016-03-23 (×12): 3.375 g via INTRAVENOUS
  Filled 2016-03-19 (×10): qty 50

## 2016-03-19 MED ORDER — POTASSIUM CHLORIDE 20 MEQ/15ML (10%) PO SOLN: 40.0000 meq | Freq: Once | ORAL | Status: DC

## 2016-03-19 MED ORDER — VANCOMYCIN HCL IN DEXTROSE 1-5 GM/200ML-% IV SOLN
1000.0000 mg | INTRAVENOUS | Status: DC
  Administered 2016-03-20 – 2016-03-21 (×2): 1000 mg via INTRAVENOUS
  Filled 2016-03-19 (×2): qty 200

## 2016-03-19 MED ORDER — POLYVINYL ALCOHOL 1.4 % OP SOLN
1.0000 [drp] | Freq: Every day | OPHTHALMIC | Status: DC
  Administered 2016-03-19: 1 [drp] via OPHTHALMIC
  Filled 2016-03-19: qty 15

## 2016-03-19 MED ORDER — ACETAMINOPHEN 650 MG RE SUPP
RECTAL | Status: AC
  Administered 2016-03-19: 650 mg
  Filled 2016-03-19: qty 1

## 2016-03-19 MED ORDER — LEVOTHYROXINE SODIUM 100 MCG IV SOLR
75.0000 ug | Freq: Every day | INTRAVENOUS | Status: DC
  Administered 2016-03-20: 75 ug via INTRAVENOUS
  Filled 2016-03-19: qty 5

## 2016-03-19 MED ORDER — BUPROPION HCL ER (SR) 100 MG PO TB12
200.0000 mg | ORAL_TABLET | Freq: Every day | ORAL | Status: DC
Start: 2016-03-20 — End: 2016-03-19

## 2016-03-19 NOTE — ED Notes (Signed)
EDP made aware of difficulties in obtaining EKG. Will repeat

## 2016-03-19 NOTE — ED Provider Notes (Signed)
[facility]-EMERGENCY DEPT Provider Note   CSN: [id number] Arrival date & time: 03/19/16  1020     History   Chief Complaint Chief Complaint  Patient presents with  . Code Sepsis    HPI Kristopher Jennings is a 75 y.o. male.  The history is provided by the EMS personnel. No language interpreter was used.   Kristopher Jennings is a 75 y.o. male who presents to the Emergency Department complaining of fever. Level V caveat due to AMS.  History is provided by EMS. Per report he developed fever to 101.4 at his nursing facility with increased work of breathing. Nursing facility states a change in mental status but it is unclear what his baseline is. He has a history of prior CVA as well as Parkinson's and dementia. No known sick contacts. EMS reported increased oxygen requirement. Sxs are severe, constant, worsening.    Past Medical History:  Diagnosis Date  . Anxiety   . Arthritis   . Ataxia   . Cauda equina syndrome (HCC)   . Depression   . Dysphagia   . Essential Tremors   . Paralysis agitans (HCC) 01/12/2013  . Thyroid disease   . UTI (lower urinary tract infection)     Patient Active Problem List   Diagnosis Date Noted  . Lobar pneumonia (HCC) 03/19/2016  . Sepsis, unspecified organism (HCC) 03/19/2016  . Acute encephalopathy 03/19/2016  . Aspiration pneumonia (HCC) 03/19/2016  . BPH (benign prostatic hyperplasia) 01/01/2014  . Thyroid activity decreased 10/04/2013  . Fever, unspecified 10/03/2013  . Unspecified constipation 10/02/2013  . Other specified disease of white blood cells 10/02/2013  . Cough 05/03/2013  . Urinary tract infection, site not specified 02/26/2013  . Urinary hesitancy 02/19/2013  . Paralysis agitans (HCC) 01/12/2013  . Essential hypertension, benign 01/08/2013  . Hypertrophy of prostate without urinary obstruction and other lower urinary tract symptoms (LUTS) 01/08/2013  . Anxiety state, unspecified 01/08/2013  . Benign essential tremor 12/13/2012  .  H/O urinary retention 12/13/2012  . Hypothyroidism 12/13/2012  . Insomnia 12/13/2012    Past Surgical History:  Procedure Laterality Date  . LAMINECTOMY    . SPINE SURGERY         Home Medications    Prior to Admission medications   Medication Sig Start Date End Date Taking? Authorizing Provider  acetaminophen (TYLENOL) 325 MG tablet Take 650 mg by mouth every 4 (four) hours as needed for mild pain, moderate pain, fever or headache.    Yes Historical Provider, MD  buPROPion (WELLBUTRIN SR) 200 MG 12 hr tablet Take 200 mg by mouth daily before breakfast.    Yes Historical Provider, MD  Cholecalciferol (VITAMIN D) 2000 units CAPS Take 2,000 Units by mouth daily.    Yes Historical Provider, MD  clonazePAM (KLONOPIN) 0.5 MG tablet Take 0.5 mg by mouth daily.   Yes Historical Provider, MD  Dextromethorphan-Quinidine (NUEDEXTA) 20-10 MG CAPS Take 1 capsule by mouth 2 (two) times daily.   Yes Historical Provider, MD  diclofenac sodium (VOLTAREN) 1 % GEL Apply 2 g topically 4 (four) times daily as needed (for pain). Pt applies to hips and heels.   Yes Historical Provider, MD  docusate (COLACE) 50 MG/5ML liquid Take 100 mg by mouth 2 (two) times daily.   Yes Historical Provider, MD  levothyroxine (SYNTHROID, LEVOTHROID) 175 MCG tablet Take 175 mcg by mouth daily before breakfast.   Yes Historical Provider, MD  Melatonin 3 MG TABS Take 3 mg by mouth at bedtime.   Yes  Historical Provider, MD  mirtazapine (REMERON) 7.5 MG tablet Take 7.5 mg by mouth at bedtime.   Yes Historical Provider, MD  QUEtiapine (SEROQUEL) 50 MG tablet Take 50 mg by mouth 2 (two) times daily.   Yes Historical Provider, MD  senna (SENOKOT) 8.6 MG TABS tablet Take 1 tablet by mouth at bedtime.   Yes Historical Provider, MD  tamsulosin (FLOMAX) 0.4 MG CAPS capsule Take 0.8 mg by mouth at bedtime.    Yes Historical Provider, MD    Family History No family history on file.  Social History Social History  Substance Use  Topics  . Smoking status: Former Games developer  . Smokeless tobacco: Never Used  . Alcohol use No     Allergies   Demerol [meperidine]; Lactose intolerance (gi); Nickel; and Oxycodone   Review of Systems Review of Systems  Unable to perform ROS: Mental status change     Physical Exam Updated Vital Signs BP 134/74 (BP Location: Right Arm)   Pulse 80   Temp 98.3 F (36.8 C) (Oral)   Resp (!) 21   Ht 5\' 10"  (1.778 m)   Wt 129 lb (58.5 kg) Comment: verbal from RN from a couple of years ago, cannot obtain wt  SpO2 94%   BMI 18.51 kg/m   Physical Exam  Constitutional: He appears well-developed and well-nourished. He appears distressed.  HENT:  Head: Normocephalic and atraumatic.  Cardiovascular: Regular rhythm.   No murmur heard. Tachycardiac  Pulmonary/Chest: No respiratory distress.  Tachypnea with rhonchi.  Abdominal: Soft. There is no tenderness. There is no rebound and no guarding.  Musculoskeletal: He exhibits no edema or tenderness.  Neurological: He is alert.  Profound generalized weakness. Nonverbal but does follow basic commands.  Skin: Skin is warm and dry.  Psychiatric: He has a normal mood and affect. His behavior is normal.  Nursing note and vitals reviewed.    ED Treatments / Results  Labs (all labs ordered are listed, but only abnormal results are displayed) Labs Reviewed  COMPREHENSIVE METABOLIC PANEL - Abnormal; Notable for the following:       Result Value   Sodium 147 (*)    Potassium 3.1 (*)    Chloride 115 (*)    Glucose, Bld 145 (*)    BUN 28 (*)    Calcium 7.5 (*)    Albumin 2.7 (*)    AST 43 (*)    ALT 73 (*)    GFR calc non Af Amer 59 (*)    All other components within normal limits  CBC WITH DIFFERENTIAL/PLATELET - Abnormal; Notable for the following:    RBC 4.13 (*)    Hemoglobin 12.5 (*)    HCT 38.2 (*)    Platelets 140 (*)    Monocytes Absolute 1.3 (*)    All other components within normal limits  URINALYSIS, ROUTINE W REFLEX  MICROSCOPIC - Abnormal; Notable for the following:    APPearance CLOUDY (*)    Hgb urine dipstick SMALL (*)    Protein, ur 100 (*)    Leukocytes, UA LARGE (*)    Squamous Epithelial / LPF 0-5 (*)    All other components within normal limits  BLOOD GAS, ARTERIAL - Abnormal; Notable for the following:    pO2, Arterial 219 (*)    All other components within normal limits  PROTIME-INR - Abnormal; Notable for the following:    Prothrombin Time 16.4 (*)    All other components within normal limits  CBC - Abnormal; Notable for  the following:    RBC 3.79 (*)    Hemoglobin 11.6 (*)    HCT 35.1 (*)    Platelets 125 (*)    All other components within normal limits  COMPREHENSIVE METABOLIC PANEL - Abnormal; Notable for the following:    Sodium 149 (*)    Chloride 119 (*)    Glucose, Bld 175 (*)    BUN 23 (*)    Calcium 7.7 (*)    Total Protein 6.3 (*)    Albumin 2.7 (*)    All other components within normal limits  MRSA PCR SCREENING  CULTURE, BLOOD (ROUTINE X 2)  CULTURE, BLOOD (ROUTINE X 2)  URINE CULTURE  CULTURE, EXPECTORATED SPUTUM-ASSESSMENT  INFLUENZA PANEL BY PCR (TYPE A & B, H1N1)  APTT  STREP PNEUMONIAE URINARY ANTIGEN  I-STAT CG4 LACTIC ACID, ED  I-STAT TROPOININ, ED  I-STAT CG4 LACTIC ACID, ED    EKG  EKG Interpretation None       Radiology Dg Chest Port 1 View  Result Date: 03/19/2016 CLINICAL DATA:  Fever, altered mental status EXAM: PORTABLE CHEST 1 VIEW COMPARISON:  None. FINDINGS: There is right lower lobe hazy airspace disease most concerning for pneumonia. There is no pleural effusion or pneumothorax. The heart and mediastinal contours are unremarkable. The osseous structures are unremarkable. There is a stimulator power pack projecting over the left chest wall with a lead directed cranially. IMPRESSION: Right lower lobe pneumonia. Followup PA and lateral chest X-ray is recommended in 3-4 weeks following trial of antibiotic therapy to ensure resolution and  exclude underlying malignancy. Electronically Signed   By: Elige Ko   On: 03/19/2016 10:51    Procedures Procedures (including critical care time)  Medications Ordered in ED Medications  vancomycin (VANCOCIN) IVPB 1000 mg/200 mL premix (not administered)  piperacillin-tazobactam (ZOSYN) IVPB 3.375 g (3.375 g Intravenous Given 03/20/16 0355)  polyvinyl alcohol (LIQUIFILM TEARS) 1.4 % ophthalmic solution 1 drop (1 drop Both Eyes Given 03/19/16 2128)  sodium chloride flush (NS) 0.9 % injection 3 mL (3 mLs Intravenous Given 03/19/16 2132)  acetaminophen (TYLENOL) tablet 650 mg (not administered)    Or  acetaminophen (TYLENOL) suppository 650 mg (not administered)  LORazepam (ATIVAN) injection 0.5 mg (not administered)  levothyroxine (SYNTHROID, LEVOTHROID) injection 75 mcg (not administered)  dextrose 5 % and 0.45 % NaCl with KCl 40 mEq/L infusion ( Intravenous New Bag/Given 03/20/16 0634)  acetaminophen (TYLENOL) 650 MG suppository (650 mg  Given 03/19/16 1046)  vancomycin (VANCOCIN) IVPB 1000 mg/200 mL premix (0 mg Intravenous Stopped 03/19/16 1204)  sodium chloride 0.9 % bolus 1,000 mL (0 mLs Intravenous Stopped 03/19/16 1227)    And  sodium chloride 0.9 % bolus 1,000 mL (0 mLs Intravenous Stopped 03/19/16 1124)  piperacillin-tazobactam (ZOSYN) IVPB 3.375 g (3.375 g Intravenous New Bag/Given 03/19/16 1121)     Initial Impression / Assessment and Plan / ED Course  I have reviewed the triage vital signs and the nursing notes.  Pertinent labs & imaging results that were available during my care of the patient were reviewed by me and considered in my medical decision making (see chart for details).  Clinical Course     Patient here from nursing facility for fever and increased oxygen requirement. Patient was very ill appearing on  Initial exam and nonverbal at that time. Concern for potential aspiration pneumonia based on hx, xray.  He was started on broad spectrum antibiotics. On  repeat assessment after his fever resolved he appears improved and is able  to communicate but does have ongoing rhonchi and tachypnea on examination. Attempted to contact his son but unable to reach by phone. Hospitalist consulted for admission for further treatment.  Final Clinical Impressions(s) / ED Diagnoses   Final diagnoses:  None    New Prescriptions Current Discharge Medication List       Tilden FossaElizabeth Moriah Loughry, MD 03/20/16 [phone]

## 2016-03-19 NOTE — ED Notes (Signed)
Ekg difficulties continue, RN aware

## 2016-03-19 NOTE — ED Notes (Signed)
Bed: [id number] Expected date:  Expected time:  Means of arrival:  Comments: EMS possible sepsis

## 2016-03-19 NOTE — Progress Notes (Signed)
UR completed (Interqual)

## 2016-03-19 NOTE — H&P (Signed)
History and Physical  Kristopher MerinoRussell Jennings UJW:[id number] DOB: 1940/10/11 DOA: 03/19/2016  PCP: Katy ApoPOLITE,RONALD D, MD  [place]  Chief Complaint: Short of breath  HPI:  75 year old man PMH Parkinson's disease, stroke, presented with history fever, shortness of breath and altered mental status. Initial evaluation revealed right lower lobe infiltrate, fever and patient was admitted for further treatment of the same as well as sepsis.  History Limited. The patient is not sure why he is here. He did have some vomiting earlier today he notes. He denies any difficulty swallowing. No shortness of breath now. He is alert and oriented and in appears to be her feeling well, nevertheless he does not answer  some questions despite multiple attempts. Another times gives inappropriate information. According to the chart he came for "altered mental status", fever. Review of systems is negative.  ED Course: temp 102.4, SBP 98/63, RR 80 Pertinent labs: lactate normal, ABG normal, CMP mild elevation sodium, chloride, AST, ALT. Plts 140, Hgb 12.5 Imaging: CXR independently reviewed: RLL infiltrate  Review of Systems:  Negative for fever, new visual changes, sore throat, rash, new muscle aches, chest pain, SOB, dysuria, bleeding  Positive for vomiting  Past Medical History:  Diagnosis Date  . Anxiety   . Arthritis   . Ataxia   . Cauda equina syndrome (HCC)   . Depression   . Dysphagia   . Essential Tremors   . Paralysis agitans (HCC) 01/12/2013  . Thyroid disease   . UTI (lower urinary tract infection)     Past Surgical History:  Procedure Laterality Date  . LAMINECTOMY    . SPINE SURGERY       reports that he has quit smoking. He has never used smokeless tobacco. He reports that he does not drink alcohol. His drug history is not on file.   Allergies  Allergen Reactions  . Demerol [Meperidine]     Unknown reaction per MAR   . Lactose Intolerance (Gi)     Unknown  reaction per MAR   . Nickel     Unknown reaction per MAR   . Oxycodone     [facility]     No family history on file.   Prior to Admission medications   Medication Sig Start Date End Date Taking? Authorizing Provider  acetaminophen (TYLENOL) 325 MG tablet Take 650 mg by mouth every 4 (four) hours as needed for mild pain, moderate pain, fever or headache.    Yes Historical Provider, MD  buPROPion (WELLBUTRIN SR) 200 MG 12 hr tablet Take 200 mg by mouth daily with breakfast. For depression    Yes Historical Provider, MD  Cholecalciferol (VITAMIN D) 2000 units CAPS Take 2,000 Units by mouth daily with breakfast.   Yes Historical Provider, MD  clonazePAM (KLONOPIN) 0.5 MG tablet Take one tablet by mouth every morning for anxiety Patient taking differently: Take 0.5 mg by mouth daily with breakfast.  08/10/13  Yes Tiffany L Reed, DO  Dextromethorphan-Quinidine (NUEDEXTA) 20-10 MG CAPS Take 1 capsule by mouth 2 (two) times daily.   Yes Historical Provider, MD  diclofenac sodium (VOLTAREN) 1 % GEL Apply 2 g topically 4 (four) times daily as needed. Apply to hips and heels as needed for pain   Yes Historical Provider, MD  docusate (COLACE) 50 MG/5ML liquid Take 100 mg by mouth 2 (two) times daily.   Yes Historical Provider, MD  levothyroxine (SYNTHROID, LEVOTHROID) 175 MCG tablet Take 175 mcg by mouth daily before breakfast.  Yes Historical Provider, MD  Melatonin 3 MG CAPS Take 1 capsule by mouth at bedtime.    Yes Historical Provider, MD  QUEtiapine (SEROQUEL) 50 MG tablet Take 50 mg by mouth 2 (two) times daily.   Yes Historical Provider, MD  tamsulosin (FLOMAX) 0.4 MG CAPS capsule Take 0.8 mg by mouth at bedtime.    Yes Historical Provider, MD  acetaminophen (TYLENOL) 650 MG suppository Place rectally. Unwrap and insert 1 per rectum every 6 hours as needed for temp *Do Not exceed 4 gms of Tylenol in 24 hours*    Historical Provider, MD  Artificial Tear Solution (AKWA TEARS RENEWED)  0.1-0.3 % SOLN Apply to eye. Instill 1 drop into each eye as needed for dryness *wiat 3-5 minutes between 2 eye meds*    Historical Provider, MD  carbidopa-levodopa (SINEMET IR) 25-100 MG per tablet Take by mouth. Take 2 1/2 tablets by mouth every 3 days    Historical Provider, MD  carboxymethylcellulose (REFRESH) 1 % ophthalmic solution Apply 1 drop to eye daily. Every night at bedtime *wait 3-5 minutes between 2 eye meds*    Historical Provider, MD  clonazePAM (KLONOPIN) 1 MG tablet Take 1 and 1/2 tablet by mouth every night at bedtime for anxiety/insomnia 10/09/13   Kimber Relic, MD  feeding supplement (BOOST HIGH PROTEIN) LIQD Take 1 Container by mouth 3 (three) times daily between meals.    Historical Provider, MD  guaiFENesin (ROBITUSSIN) 100 MG/5ML SOLN Take 5 mLs by mouth every 4 (four) hours as needed. cough & congestion    Historical Provider, MD  polyethylene glycol (MIRALAX / GLYCOLAX) packet Take 17 g by mouth daily.    Historical Provider, MD  Probiotic Product (PROBIOTIC DAILY) CAPS Take by mouth. Take 1 capsule by mouth twice daily for 30 days    Historical Provider, MD  senna (SENOKOT) 8.6 MG TABS tablet Take 1 tablet by mouth at bedtime.    Historical Provider, MD  sertraline (ZOLOFT) 100 MG tablet Take 100 mg by mouth daily.    Historical Provider, MD    Physical Exam: Vitals:   03/19/16 1042 03/19/16 1115 03/19/16 1130 03/19/16 1230  BP: 107/77 101/66 102/68 98/63  Pulse: 90 85 85 80  Resp: 20 (!) 27 (!) 28 24  Temp: 102.4 F (39.1 C)     TempSrc: Rectal     SpO2:  100% 91% 94%  Weight:  58.5 kg (129 lb)    Height:        Constitutional:  . Appears Chronically ill, nontoxic, comfortable Eyes:  . Pupils and irises appear normal . Normal lids ENMT:  . hard of hearing . Lips appear normal . Wet, gurgly quality to voice Neck:  . neck appears normal, no masses . no thyromegaly Respiratory:  . CTA bilaterally, no w/r/r.  . Mild increased respiratory  effort. Cardiovascular:  . RRR, no m/r/g . No LE extremity edema   Abdomen:  . Abdomen appears normal; no tenderness or masses . No hernias noted Musculoskeletal:  . Digits/nails both hands: no clubbing, cyanosis, petechiae, infection . RUE, LUE, RLE, LLE   o strength and tone decreased left greater than right, no atrophy, bilateral intention tremor noted o No tenderness, masses Skin:  . No rashes, lesions, ulcers . palpation of skin: no induration or nodules Psychiatric:  . judgement and insight appear normal  . Mental status o Orientation to person, place, time  o Mood, affect appropriate  Wt Readings from Last 3 Encounters:  03/19/16 58.5  kg (129 lb)  01/01/14 58.5 kg (129 lb)  12/06/13 54.4 kg (120 lb)    I have personally reviewed following labs and imaging studies  Labs on Admission:  CBC:  Recent Labs Lab 03/19/16 1036  WBC 7.5  NEUTROABS 5.1  HGB 12.5*  HCT 38.2*  MCV 92.5  PLT 140*   Basic Metabolic Panel:  Recent Labs Lab 03/19/16 1036  NA 147*  K 3.1*  CL 115*  CO2 22  GLUCOSE 145*  BUN 28*  CREATININE 1.19  CALCIUM 7.5*   Liver Function Tests:  Recent Labs Lab 03/19/16 1036  AST 43*  ALT 73*  ALKPHOS 57  BILITOT 0.6  PROT 6.5  ALBUMIN 2.7*    Radiological Exams on Admission: Dg Chest Port 1 View  Result Date: 03/19/2016 CLINICAL DATA:  Fever, altered mental status EXAM: PORTABLE CHEST 1 VIEW COMPARISON:  None. FINDINGS: There is right lower lobe hazy airspace disease most concerning for pneumonia. There is no pleural effusion or pneumothorax. The heart and mediastinal contours are unremarkable. The osseous structures are unremarkable. There is a stimulator power pack projecting over the left chest wall with a lead directed cranially. IMPRESSION: Right lower lobe pneumonia. Followup PA and lateral chest X-ray is recommended in 3-4 weeks following trial of antibiotic therapy to ensure resolution and exclude underlying malignancy.  Electronically Signed   By: Elige KoHetal  Patel   On: 03/19/2016 10:51     Principal Problem:   Sepsis, unspecified organism [facility](HCC) Active Problems:   Lobar pneumonia (HCC)   Acute encephalopathy   Aspiration pneumonia (HCC)   Assessment/Plan 1. Possible sepsis secondary to lobar pneumonia (Fever, tachypnea, reported hypoxia). Blood pressure, heart rate stable. AMS appears resolved.  Lactic acid normal x2. Appears comfortable on nasal cannula, interactive with examination.  Empiric antibiotics directed at aspiration. Follow-up culture data. 2. RLL pneumonia  Treat as aspiration. ST consult. Low threshold to discontinue vancomycin the next 24 hours is improving clinically. 3. Acute encephalopathy? Appears resolved.  Alert, oriented to person place and time. 4. Hypokalemia.  Replete. 5. Mild transaminitis of unclear significance.  Repeat CMP in the morning. 6. Chronic bilateral lower extremity edema. Stable.  Monitor. 7. PMH dementia, Parkinson's disease,   Appears stable., Monitor.   Appears to be improving. Received fluid bolus. Plan continue antibiotics and follow closely.  It is my clinical opinion that admission to INPATIENT is reasonable and necessary in this patient . presenting with symptoms fever, tachypnea, respiratory infection . in the context of PMH including: Parkinson's disease, dementia  . with pertinent positives on physical exam including: Wet cough. and pertinent positives on radiographic and laboratory data including: Lobar pneumonia.  Given the aforementioned, the predictability of an adverse outcome is felt to be significant. I expect that the patient will require at least 2 midnights in the hospital to treat this condition.  DVT prophylaxis:SCDs Code Status: full code Family Communication: none Consults called: none    Time spent: 50 minutes  Brendia Sacksaniel Goodrich, MD  [facility] Direct contact: [phone] --Via amion app  OR  --[email or web address]; password [email or web address]  7PM-7AM contact night coverage as above  03/19/2016, 12:37 PM

## 2016-03-19 NOTE — Evaluation (Signed)
Clinical/Bedside Swallow Evaluation Patient Details  Name: Kristopher Jennings MRN: [id number] Date of Birth: 1940/07/22  Today's Date: 03/19/2016 Time: SLP Start Time (ACUTE ONLY): 1655 SLP Stop Time (ACUTE ONLY): 1712 SLP Time Calculation (min) (ACUTE ONLY): 17 min  Past Medical History:  Past Medical History:  Diagnosis Date  . Anxiety   . Arthritis   . Ataxia   . Cauda equina syndrome (HCC)   . Depression   . Dysphagia   . Essential Tremors   . Paralysis agitans (HCC) 01/12/2013  . Thyroid disease   . UTI (lower urinary tract infection)    Past Surgical History:  Past Surgical History:  Procedure Laterality Date  . LAMINECTOMY    . SPINE SURGERY     HPI:  75 year old man PMH Parkinson's disease, stroke, presented with history fever, shortness of breath and altered mental status; sepsis suspected. CXR right lower lobe pneumonia. Outpatient MBS 10/27/15 revealed penetration and aspiration mostly silent. Chin tuck increased airway protection with nectar and reduced residue in valleculae. Dys 1 texture and nectar thick with chin tuck recommended.   Assessment / Plan / Recommendation Clinical Impression  Suspect pharyngeal dysphagia with nectar thick (recommended after MBS 10/2015) with possible delayed swallow, immediate and delayed cough folllowing nectar (baseline cough as well). Slow, weak appearing swallow. Recommend pt continue NPO status with MBS to fully assess oral and pharyngeal swallow.     Aspiration Risk  Severe aspiration risk    Diet Recommendation NPO        Other  Recommendations Oral Care Recommendations: Oral care QID   Follow up Recommendations  (TBD)      Frequency and Duration            Prognosis        Swallow Study   General HPI: 75 year old man PMH Parkinson's disease, stroke, presented with history fever, shortness of breath and altered mental status; sepsis suspected. CXR right lower lobe pneumonia. Outpatient MBS 10/27/15 revealed  penetration and aspiration mostly silent. Chin tuck increased airway protection with nectar and reduced residue in valleculae. Dys 1 texture and nectar thick with chin tuck recommended. Type of Study: Bedside Swallow Evaluation Previous Swallow Assessment:  (see HPI) Diet Prior to this Study: NPO (nectar thick at SNF) Temperature Spikes Noted: Yes Respiratory Status: Nasal cannula History of Recent Intubation: No Behavior/Cognition: Alert;Cooperative;Pleasant mood Oral Cavity Assessment: Within Functional Limits Oral Care Completed by SLP: No Oral Cavity - Dentition: Poor condition;Missing dentition (missing most lower) Vision: Functional for self-feeding Self-Feeding Abilities: Able to feed self Patient Positioning: Upright in bed Baseline Vocal Quality: Low vocal intensity Volitional Cough: Weak Volitional Swallow: Able to elicit    Oral/Motor/Sensory Function Overall Oral Motor/Sensory Function: Moderate impairment (prior CVA) Facial ROM: Reduced left;Suspected CN VII (facial) dysfunction Facial Symmetry: Abnormal symmetry left;Suspected CN VII (facial) dysfunction Facial Strength: Reduced left;Suspected CN VII (facial) dysfunction Lingual ROM:  (decreased precision)   Ice Chips Ice chips: Not tested   Thin Liquid Thin Liquid: Not tested (nectar thick at SNF per pt)    Nectar Thick Nectar Thick Liquid: Impaired Presentation: Cup Pharyngeal Phase Impairments: Suspected delayed Swallow;Cough - Immediate;Cough - Delayed   Honey Thick Honey Thick Liquid: Not tested   Puree Puree: Impaired Pharyngeal Phase Impairments: Suspected delayed Swallow   Solid   GO   Solid: Not tested        Kristopher Jennings, Kristopher Jennings 03/19/2016,5:23 PM  Kristopher Jennings M.Ed [employer] Pager [phone]

## 2016-03-19 NOTE — ED Notes (Signed)
Pt to go to 1226 in 20 minutes.

## 2016-03-19 NOTE — ED Triage Notes (Signed)
Per EMS report: [facility] and presents with altered mental status.  Pt also have a fever and feels warm to the touch.  Pt hx of parkinsons.

## 2016-03-19 NOTE — ED Notes (Signed)
Patient attempted to provide urine sample in the urinal. Patient was unsuccessful.

## 2016-03-19 NOTE — Progress Notes (Signed)
Pharmacy Antibiotic Note  Kristopher Jennings is a 75 y.o. male admitted on 03/19/2016 with pneumonia and sepsis.  Pharmacy has been consulted for Vanc/Zosyn dosing.  Plan: Vancomycin 1g IV every 24 hours.  Goal trough 15-20 mcg/mL. Zosyn 3.375g IV q8h (4 hour infusion).   Height: 5\' 10"  (177.8 cm) Weight: 129 lb (58.5 kg) (verbal from RN from a couple of years ago, cannot obtain wt) IBW/kg (Calculated) : 73  Temp (24hrs), Avg:102.4 F (39.1 C), Min:102.4 F (39.1 C), Max:102.4 F (39.1 C)   Recent Labs Lab 03/19/16 1036 03/19/16 1122  WBC 7.5  --   CREATININE 1.19  --   LATICACIDVEN  --  1.22    Estimated Creatinine Clearance: 45.7 mL/min (by C-G formula based on SCr of 1.19 mg/dL).    Allergies  Allergen Reactions  . Demerol [Meperidine]     Unknown reaction per MAR   . Lactose Intolerance (Gi)     Unknown reaction per MAR   . Nickel     Unknown reaction per MAR   . Oxycodone     [facility] reaction [facility]     Thank you for allowing pharmacy to be a part of this patient's care.   Hessie KnowsJustin M Tyquavious Jennings, PharmD, BCPS Pager [phone] 03/19/2016 11:58 AM

## 2016-03-20 ENCOUNTER — Inpatient Hospital Stay (HOSPITAL_COMMUNITY): Payer: Medicare Other

## 2016-03-20 DIAGNOSIS — L8990 Pressure ulcer of unspecified site, unspecified stage: Secondary | ICD-10-CM | POA: Insufficient documentation

## 2016-03-20 LAB — COMPREHENSIVE METABOLIC PANEL
ALBUMIN: 2.7 g/dL — AB (ref 3.5–5.0)
ALT: 62 U/L (ref 17–63)
AST: 41 U/L (ref 15–41)
Alkaline Phosphatase: 58 U/L (ref 38–126)
Anion gap: 7 (ref 5–15)
BUN: 23 mg/dL — AB (ref 6–20)
CO2: 23 mmol/L (ref 22–32)
CREATININE: 0.91 mg/dL (ref 0.61–1.24)
Calcium: 7.7 mg/dL — ABNORMAL LOW (ref 8.9–10.3)
Chloride: 119 mmol/L — ABNORMAL HIGH (ref 101–111)
GFR calc Af Amer: >60 mL/min (ref >60)
GLUCOSE: 175 mg/dL — AB (ref 65–99)
Potassium: 3.7 mmol/L (ref 3.5–5.1)
Sodium: 149 mmol/L — ABNORMAL HIGH (ref 135–145)
Total Bilirubin: 0.7 mg/dL (ref 0.3–1.2)
Total Protein: 6.3 g/dL — ABNORMAL LOW (ref 6.5–8.1)

## 2016-03-20 LAB — URINE CULTURE

## 2016-03-20 LAB — CBC
HCT: 35.1 % — ABNORMAL LOW (ref 39.0–52.0)
Hemoglobin: 11.6 g/dL — ABNORMAL LOW (ref 13.0–17.0)
MCH: 30.6 pg (ref 26.0–34.0)
MCHC: 33 g/dL (ref 30.0–36.0)
MCV: 92.6 fL (ref 78.0–100.0)
PLATELETS: 125 10*3/uL — AB (ref 150–400)
RBC: 3.79 MIL/uL — AB (ref 4.22–5.81)
RDW: 14.5 % (ref 11.5–15.5)
WBC: 7.9 10*3/uL (ref 4.0–10.5)

## 2016-03-20 MED ORDER — QUETIAPINE FUMARATE 25 MG PO TABS
50.0000 mg | ORAL_TABLET | Freq: Two times a day (BID) | ORAL | Status: DC
  Administered 2016-03-20 – 2016-03-23 (×6): 50 mg via ORAL
  Filled 2016-03-20 (×6): qty 2

## 2016-03-20 MED ORDER — DEXTROMETHORPHAN-QUINIDINE 20-10 MG PO CAPS
1.0000 | ORAL_CAPSULE | Freq: Two times a day (BID) | ORAL | Status: DC
  Administered 2016-03-20 – 2016-03-23 (×6): 1 via ORAL
  Filled 2016-03-20 (×7): qty 1

## 2016-03-20 MED ORDER — LEVOTHYROXINE SODIUM 25 MCG PO TABS
175.0000 ug | ORAL_TABLET | Freq: Every day | ORAL | Status: DC
  Administered 2016-03-21 – 2016-03-23 (×3): 175 ug via ORAL
  Filled 2016-03-20 (×3): qty 1

## 2016-03-20 MED ORDER — BUPROPION HCL ER (SR) 100 MG PO TB12
200.0000 mg | ORAL_TABLET | Freq: Every day | ORAL | Status: DC
  Administered 2016-03-21 – 2016-03-23 (×3): 200 mg via ORAL
  Filled 2016-03-20 (×3): qty 2

## 2016-03-20 MED ORDER — MIRTAZAPINE 15 MG PO TABS
7.5000 mg | ORAL_TABLET | Freq: Every day | ORAL | Status: DC
  Administered 2016-03-20 – 2016-03-22 (×3): 7.5 mg via ORAL
  Filled 2016-03-20 (×3): qty 1

## 2016-03-20 MED ORDER — CLONAZEPAM 0.5 MG PO TABS
0.5000 mg | ORAL_TABLET | Freq: Every day | ORAL | Status: DC
  Administered 2016-03-20 – 2016-03-23 (×4): 0.5 mg via ORAL
  Filled 2016-03-20 (×4): qty 1

## 2016-03-20 MED ORDER — TAMSULOSIN HCL 0.4 MG PO CAPS
0.8000 mg | ORAL_CAPSULE | Freq: Every day | ORAL | Status: DC
  Administered 2016-03-20 – 2016-03-22 (×3): 0.8 mg via ORAL
  Filled 2016-03-20 (×3): qty 2

## 2016-03-20 MED ORDER — RESOURCE THICKENUP CLEAR PO POWD
ORAL | Status: DC | PRN
  Filled 2016-03-20 (×2): qty 125

## 2016-03-20 NOTE — Progress Notes (Addendum)
PROGRESS NOTE    Kristopher Jennings  ZOX:[id number] DOB: 08-17-1940 DOA: 03/19/2016 PCP: Katy Apo, MD   Outpatient Specialists:     Brief Narrative:  75 year old man PMH Parkinson's disease, stroke, presented with history fever, shortness of breath and altered mental status. Initial evaluation revealed right lower lobe infiltrate, fever and patient was admitted for further treatment of the same as well as sepsis.  History Limited. The patient is not sure why he is here. He did have some vomiting earlier today he notes. He denies any difficulty swallowing. No shortness of breath now. He is alert and oriented and in appears to be her feeling well, nevertheless he does not answer  some questions despite multiple attempts. Another times gives inappropriate information. According to the chart he came for "altered mental status", fever. Review of systems is negative.    Assessment & Plan:   Principal Problem:   Sepsis, unspecified organism [facility]) Active Problems:   Paralysis agitans (HCC)   Lobar pneumonia (HCC)   Acute encephalopathy   Aspiration pneumonia (HCC)   Pressure injury of skin   Possible sepsis secondary to lobar pneumonia (Fever, tachypnea, reported hypoxia). Blood pressure, heart rate stable. AMS appears resolved. Empiric antibiotics directed at aspiration. Follow-up culture data.  RLL pneumonia Treat as aspiration-says he has had several episodes in the past NPO ST consult- await MBS  Followup PA and lateral chest X-ray is recommended in 3-4 weeks following trial of antibiotic therapy to ensure  resolution and exclude underlying malignancy.  Hypokalemia. Replete.  Hypernatremia Free water when able to take PO  Mild transaminitis of unclear significance. Resolved, ? Due to sepsis  Chronic bilateral lower extremity edema. Stable.  PMH dementia, Parkinson's disease  Appears stable- resume home meds when able to take PO   Monitor.   DVT  prophylaxis:  SCD's  Code Status: Full Code   Family Communication:   Disposition Plan:  Remain in SDU until MBS and able to eat   Consultants:   SLP     Subjective: No complaints  Objective: Vitals:   03/20/16 0500 03/20/16 0600 03/20/16 0700 03/20/16 0800  BP: 134/74 124/72 130/76 129/68  Pulse:    80  Resp: (!) 21 (!) 21 (!) 21 (!) 23  Temp:      TempSrc:      SpO2: 94% 97% 96% 97%  Weight:      Height:        Intake/Output Summary (Last 24 hours) at 03/20/16 1130 Last data filed at 03/20/16 0500  Gross per 24 hour  Intake          1054.17 ml  Output               50 ml  Net          1004.17 ml   Filed Weights   03/19/16 1115  Weight: 58.5 kg (129 lb)    Examination:  General exam: elderly, chronically ill Respiratory system: Clear to auscultation, no wheezing Cardiovascular system: S1 & S2 heard, RRR. No JVD, murmurs, rubs, gallops or clicks. No pedal edema. Gastrointestinal system: Abdomen is nondistended, soft and nontender. No organomegaly or masses felt. Normal bowel sounds heard. Central nervous system: Alert and oriented. No focal neurological deficits. Extremities: Symmetric 5 x 5 power. Skin: No rashes, lesions or ulcers Psychiatry: Judgement and insight appear normal. Mood & affect appropriate.     Data Reviewed: I have personally reviewed following labs and imaging studies  CBC:  Recent Labs Lab 03/19/16  1036 03/20/16 0327  WBC 7.5 7.9  NEUTROABS 5.1  --   HGB 12.5* 11.6*  HCT 38.2* 35.1*  MCV 92.5 92.6  PLT 140* 125*   Basic Metabolic Panel:  Recent Labs Lab 03/19/16 1036 03/20/16 0327  NA 147* 149*  K 3.1* 3.7  CL 115* 119*  CO2 22 23  GLUCOSE 145* 175*  BUN 28* 23*  CREATININE 1.19 0.91  CALCIUM 7.5* 7.7*   GFR: Estimated Creatinine Clearance: 59.8 mL/min (by C-G formula based on SCr of 0.91 mg/dL). Liver Function Tests:  Recent Labs Lab 03/19/16 1036 03/20/16 0327  AST 43* 41  ALT 73* 62  ALKPHOS  57 58  BILITOT 0.6 0.7  PROT 6.5 6.3*  ALBUMIN 2.7* 2.7*   No results for input(s): LIPASE, AMYLASE in the last 168 hours. No results for input(s): AMMONIA in the last 168 hours. Coagulation Profile:  Recent Labs Lab 03/19/16 1715  INR 1.31   Cardiac Enzymes: No results for input(s): CKTOTAL, CKMB, CKMBINDEX, TROPONINI in the last 168 hours. BNP (last 3 results) No results for input(s): PROBNP in the last 8760 hours. HbA1C: No results for input(s): HGBA1C in the last 72 hours. CBG: No results for input(s): GLUCAP in the last 168 hours. Lipid Profile: No results for input(s): CHOL, HDL, LDLCALC, TRIG, CHOLHDL, LDLDIRECT in the last 72 hours. Thyroid Function Tests: No results for input(s): TSH, T4TOTAL, FREET4, T3FREE, THYROIDAB in the last 72 hours. Anemia Panel: No results for input(s): VITAMINB12, FOLATE, FERRITIN, TIBC, IRON, RETICCTPCT in the last 72 hours. Urine analysis:    Component Value Date/Time   COLORURINE YELLOW 03/19/2016 1033   APPEARANCEUR CLOUDY (A) 03/19/2016 1033   LABSPEC 1.019 03/19/2016 1033   PHURINE 8.0 03/19/2016 1033   GLUCOSEU NEGATIVE 03/19/2016 1033   HGBUR SMALL (A) 03/19/2016 1033   BILIRUBINUR NEGATIVE 03/19/2016 1033   KETONESUR NEGATIVE 03/19/2016 1033   PROTEINUR 100 (A) 03/19/2016 1033   NITRITE NEGATIVE 03/19/2016 1033   LEUKOCYTESUR LARGE (A) 03/19/2016 1033     ) Recent Results (from the past 240 hour(s))  Blood Culture (routine x 2)     Status: None (Preliminary result)   Collection Time: 03/19/16 10:37 AM  Result Value Ref Range Status   Specimen Description BLOOD RIGHT ARM  Final   Special Requests BOTTLES DRAWN AEROBIC AND ANAEROBIC 5CC  Final   Culture   Final    NO GROWTH < 24 HOURS [facility]    Report Status PENDING  Incomplete  Blood Culture (routine x 2)     Status: None (Preliminary result)   Collection Time: 03/19/16 10:58 AM  Result Value Ref Range Status   Specimen Description BLOOD  LEFT FOREARM  Final   Special Requests BOTTLES DRAWN AEROBIC AND ANAEROBIC 5ML  Final   Culture   Final    NO GROWTH < 24 HOURS Performed at [facility]    Report Status PENDING  Incomplete  MRSA PCR Screening     Status: None   Collection Time: 03/19/16  2:30 PM  Result Value Ref Range Status   MRSA by PCR NEGATIVE NEGATIVE Final    Comment:        The GeneXpert MRSA Assay (FDA approved for NASAL specimens only), is one component of a comprehensive MRSA colonization surveillance program. It is not intended to diagnose MRSA infection nor to guide or monitor treatment for MRSA infections.       Anti-infectives    Start  Dose/Rate Route Frequency Ordered Stop   03/20/16 1000  vancomycin (VANCOCIN) IVPB 1000 mg/200 mL premix     1,000 mg 200 mL/hr over 60 Minutes Intravenous Every 24 hours 03/19/16 1201     03/19/16 2000  piperacillin-tazobactam (ZOSYN) IVPB 3.375 g     3.375 g 12.5 mL/hr over 240 Minutes Intravenous Every 8 hours 03/19/16 1201     03/19/16 1100  piperacillin-tazobactam (ZOSYN) IVPB 3.375 g     3.375 g 12.5 mL/hr over 240 Minutes Intravenous  Once 03/19/16 1059 03/19/16 1521   03/19/16 1045  ceFEPIme (MAXIPIME) 2 g in dextrose 5 % 50 mL IVPB  Status:  Discontinued     2 g 100 mL/hr over 30 Minutes Intravenous  Once 03/19/16 1033 03/19/16 1059   03/19/16 1045  vancomycin (VANCOCIN) IVPB 1000 mg/200 mL premix     1,000 mg 200 mL/hr over 60 Minutes Intravenous  Once 03/19/16 1033 03/19/16 1204       Radiology Studies: Dg Chest Port 1 View  Result Date: 03/19/2016 CLINICAL DATA:  Fever, altered mental status EXAM: PORTABLE CHEST 1 VIEW COMPARISON:  None. FINDINGS: There is right lower lobe hazy airspace disease most concerning for pneumonia. There is no pleural effusion or pneumothorax. The heart and mediastinal contours are unremarkable. The osseous structures are unremarkable. There is a stimulator power pack projecting over the left chest wall  with a lead directed cranially. IMPRESSION: Right lower lobe pneumonia. Followup PA and lateral chest X-ray is recommended in 3-4 weeks following trial of antibiotic therapy to ensure resolution and exclude underlying malignancy. Electronically Signed   By: Elige KoHetal  Patel   On: 03/19/2016 10:51        Scheduled Meds: . levothyroxine  75 mcg Intravenous Daily  . piperacillin-tazobactam (ZOSYN)  IV  3.375 g Intravenous Q8H  . polyvinyl alcohol  1 drop Both Eyes QHS  . sodium chloride flush  3 mL Intravenous Q12H  . vancomycin  1,000 mg Intravenous Q24H   Continuous Infusions: . dextrose 5 % and 0.45 % NaCl with KCl 40 mEq/L 100 mL/hr at 03/20/16 0634     LOS: 1 day    Time spent: 35 min    Tritia Endo U Tanee Henery, DO [facility] Pager [phone]  If 7PM-7AM, please contact night-coverage [email or web address] Password [email or web address] 03/20/2016, 11:30 AM

## 2016-03-20 NOTE — Progress Notes (Addendum)
Modified Barium Swallow Progress Note  Patient Details  Name: Barb MerinoRussell Rolin MRN: [id number] Date of Birth: 07-15-1940  Today's Date: 03/20/2016  Modified Barium Swallow completed.  Full report located under Chart Review in the Imaging Section.  Brief recommendations include the following:  Clinical Impression:  Moderate oral phase dysphagia; moderate pharyngeal phase dysphagia; see detailed report in imaging section      Swallow Evaluation Recommendations       SLP Diet Recommendations: Dysphagia 1 (Puree) solids;Nectar thick liquid;Other (Comment) (1/2 tsp amounts only)   Liquid Administration via: Spoon;No straw   Medication Administration: Crushed with puree   Supervision: Patient able to self feed;Staff to assist with self feeding;Full supervision/cueing for compensatory strategies   Compensations: Minimize environmental distractions;Slow rate;Small sips/bites;Multiple dry swallows after each bite/sip;Effortful swallow   Postural Changes: Seated upright at 90 degrees   Oral Care Recommendations: Oral care BID   Other Recommendations: Order thickener from pharmacy;Have oral suction available;Remove water pitcher;Prohibited food (jello, ice cream, thin soups)    Axiel Fjeld,PAT, M.S., CCC-SLP 03/20/2016,12:03 PM

## 2016-03-21 LAB — BASIC METABOLIC PANEL
Anion gap: 7 (ref 5–15)
BUN: 16 mg/dL (ref 6–20)
CHLORIDE: 120 mmol/L — AB (ref 101–111)
CO2: 22 mmol/L (ref 22–32)
CREATININE: 0.83 mg/dL (ref 0.61–1.24)
Calcium: 8.2 mg/dL — ABNORMAL LOW (ref 8.9–10.3)
GFR calc Af Amer: >60 mL/min (ref >60)
GFR calc non Af Amer: >60 mL/min (ref >60)
Glucose, Bld: 142 mg/dL — ABNORMAL HIGH (ref 65–99)
Potassium: 4 mmol/L (ref 3.5–5.1)
SODIUM: 149 mmol/L — AB (ref 135–145)

## 2016-03-21 LAB — CBC
HCT: 33.8 % — ABNORMAL LOW (ref 39.0–52.0)
HEMOGLOBIN: 11.1 g/dL — AB (ref 13.0–17.0)
MCH: 30.4 pg (ref 26.0–34.0)
MCHC: 32.8 g/dL (ref 30.0–36.0)
MCV: 92.6 fL (ref 78.0–100.0)
Platelets: 140 10*3/uL — ABNORMAL LOW (ref 150–400)
RBC: 3.65 MIL/uL — ABNORMAL LOW (ref 4.22–5.81)
RDW: 14.5 % (ref 11.5–15.5)
WBC: 9.6 10*3/uL (ref 4.0–10.5)

## 2016-03-21 LAB — STREP PNEUMONIAE URINARY ANTIGEN: STREP PNEUMO URINARY ANTIGEN: NEGATIVE

## 2016-03-21 MED ORDER — POTASSIUM CL IN DEXTROSE 5% 20 MEQ/L IV SOLN
20.0000 meq | INTRAVENOUS | Status: DC
  Administered 2016-03-21 – 2016-03-23 (×4): 20 meq via INTRAVENOUS
  Filled 2016-03-21 (×4): qty 1000

## 2016-03-21 MED ORDER — POTASSIUM CL IN DEXTROSE 5% 20 MEQ/L IV SOLN: 20.0000 meq | INTRAVENOUS | Status: DC

## 2016-03-21 NOTE — Evaluation (Signed)
Physical Therapy Evaluation Patient Details Name: Kristopher Jennings MRN: [id number] DOB: [birth year] Today's Date: 03/21/2016   History of Present Illness  75 year old man PMH Parkinson's disease, stroke, presented with history fever, shortness of breath and altered mental status. Initial evaluation revealed right lower lobe infiltrate, fever and patient was admitted for further treatment of the same as well as sepsis.  Clinical Impression  Pt admitted with above diagnosis. Pt currently with functional limitations due to the deficits listed below (see PT Problem List).  Pt will benefit from skilled PT to increase their independence and safety with mobility to allow discharge to the venue listed below.   Pt requiring max/total assist of 2  for bed mobility, rigid throughout trunk/extremities; he does follow some basic commands, although is grossly nonverbal during PT eval but he was fairly lethargic (RN states he was verbal earlier today and oriented x3 and also ate very well); no family present during PTeval to determine baseline; noted Palliative-goals of care meeting pending; will continue to follow on a trial basis/ assist with disposition      Follow Up Recommendations SNF;Supervision/Assistance - 24 hour    Equipment Recommendations  None recommended by PT    Recommendations for Other Services       Precautions / Restrictions Precautions Precautions: Fall      Mobility  Bed Mobility Overal bed mobility: Needs Assistance Bed Mobility: Supine to Sit;Sit to Supine     Supine to sit: +2 for physical assistance;Max assist;Total assist Sit to supine: +2 for physical assistance;Max assist;Total assist   General bed mobility comments: assist for trunk and LEs;   Transfers                 General transfer comment: NT  Ambulation/Gait                Stairs            Wheelchair Mobility    Modified Rankin (Stroke Patients Only)       Balance Overall  balance assessment: Needs assistance Sitting-balance support: Feet supported;No upper extremity supported Sitting balance-Leahy Scale: Zero Sitting balance - Comments: zero to poor, balance improved with incr time Postural control: Posterior lean;Left lateral lean     Standing balance comment: NT/unable                             Pertinent Vitals/Pain Pain Assessment: Faces Faces Pain Scale: No hurt    Home Living Family/patient expects to be discharged to:: Skilled nursing facility                 Additional Comments: pt from [facility]--unsure of length of time that pt has been there, no family present at time of PT eval    Prior Function Level of Independence: Needs assistance         Comments: no family presetn to determine prior level of care     Hand Dominance        Extremity/Trunk Assessment   Upper Extremity Assessment Upper Extremity Assessment: Generalized weakness;RUE deficits/detail;LUE deficits/detail RUE Deficits / Details: AAROM grossly WFL, incr tone, grip fair RUE Coordination: decreased fine motor;decreased gross motor LUE Deficits / Details: AAROM grossly WFL, grip fair LUE Coordination: decreased fine motor;decreased gross motor    Lower Extremity Assessment Lower Extremity Assessment: Generalized weakness LLE Deficits / Details: ~35* knee flexion contracture; limited ROM to near neutral bil ankles LLE Coordination: decreased gross motor  Communication   Communication: No difficulties  Cognition Arousal/Alertness: Lethargic Behavior During Therapy: Flat affect Overall Cognitive Status: Difficult to assess Area of Impairment: Problem solving             Problem Solving: Decreased initiation;Slow processing;Requires verbal cues;Requires tactile cues General Comments: pt follows or attempts to follow basic one step commands, he is grossly non-verbal during PT eval but RN states he spoke to her earlier and was  Ox3    General Comments      Exercises     Assessment/Plan    PT Assessment Patient needs continued PT services  PT Problem List Decreased activity tolerance;Decreased balance;Decreased mobility;Impaired tone          PT Treatment Interventions Functional mobility training;Therapeutic activities;Therapeutic exercise;Patient/family education;Neuromuscular re-education    PT Goals (Current goals can be found in the Care Plan section)  Acute Rehab PT Goals Patient Stated Goal: none stated PT Goal Formulation: Patient unable to participate in goal setting Time For Goal Achievement: 03/29/16 Potential to Achieve Goals: Poor    Frequency Min 2X/week   Barriers to discharge        Co-evaluation               End of Session   Activity Tolerance: Patient limited by lethargy Patient left: in bed;with call bell/phone within reach;with chair alarm set;with nursing/sitter in room Nurse Communication: Mobility status         Time: [id number] PT Time Calculation (min) (ACUTE ONLY): 22 min   Charges:   PT Evaluation $PT Eval Moderate Complexity: 1 Procedure     PT G Codes:        Kristopher Jennings 03/21/2016, 3:29 PM

## 2016-03-21 NOTE — Progress Notes (Signed)
PROGRESS NOTE    Kristopher Jennings  ZOX:[id number] DOB: Dec 31, 1940 DOA: 03/19/2016 PCP: Katy Apo, MD   Outpatient Specialists:     Brief Narrative:  75 year old man PMH Parkinson's disease, stroke, presented with history fever, shortness of breath and altered mental status. Initial evaluation revealed right lower lobe infiltrate, fever and patient was admitted for further treatment of the same as well as sepsis.  History Limited. The patient is not sure why he is here. He did have some vomiting earlier today he notes. He denies any difficulty swallowing. No shortness of breath now. He is alert and oriented and in appears to be her feeling well, nevertheless he does not answer  some questions despite multiple attempts. Another times gives inappropriate information. According to the chart he came for "altered mental status", fever. Review of systems is negative.    Assessment & Plan:   Principal Problem:   Sepsis, unspecified organism [facility]) Active Problems:   Paralysis agitans (HCC)   Lobar pneumonia (HCC)   Acute encephalopathy   Aspiration pneumonia (HCC)   Pressure injury of skin   Possible sepsis secondary to lobar pneumonia (Fever, tachypnea, reported hypoxia). Blood pressure, heart rate stable. AMS appears resolved. Empiric antibiotics directed at aspiration. Follow-up culture data.  RLL pneumonia Treat as aspiration-says he has had several episodes in the past ST consult- DYS diet 1  Followup PA and lateral chest X-ray is recommended in 3-4 weeks following trial of antibiotic therapy to ensure  resolution and exclude underlying malignancy.  Hypokalemia. Replete.  Hypernatremia Free water when able to take PO  Mild transaminitis of unclear significance. Resolved, ? Due to sepsis  Chronic bilateral lower extremity edema. Stable.  PMH dementia, Parkinson's disease  Appears stable- resume home meds when able to take PO   Monitor.  Discussed long term  prognosis with son-- he would like a palliative care evaluation. Son also not happy with care at nursing home-- social work consult  DVT prophylaxis:  SCD's  Code Status: DNR-- son states patient would not want any heroic measures   Family Communication: Called son  Disposition Plan:  Palliative care eval   Consultants:   SLP     Subjective: Asking for water  Objective: Vitals:   03/20/16 1902 03/20/16 2008 03/21/16 0310 03/21/16 0440  BP: 110/60 (!) 97/59  103/61  Pulse: 73 74 65 72  Resp: (!) 24 (!) 22  (!) 24  Temp: 98.9 F (37.2 C) 98.5 F (36.9 C)  98.6 F (37 C)  TempSrc: Oral Oral  Oral  SpO2: 97% 98%  98%  Weight:      Height:        Intake/Output Summary (Last 24 hours) at 03/21/16 1105 Last data filed at 03/21/16 0920  Gross per 24 hour  Intake          3473.33 ml  Output              900 ml  Net          2573.33 ml   Filed Weights   03/19/16 1115  Weight: 58.5 kg (129 lb)    Examination:  General exam: elderly, chronically ill Respiratory system: Clear to auscultation, no wheezing Cardiovascular system: S1 & S2 heard, RRR. No JVD, murmurs, rubs, gallops or clicks. No pedal edema. Gastrointestinal system: Abdomen is nondistended, soft and nontender. No organomegaly or masses felt. Normal bowel sounds heard. Central nervous system: Alert     Data Reviewed: I have personally reviewed following labs and  imaging studies  CBC:  Recent Labs Lab 03/19/16 1036 03/20/16 0327 03/21/16 0536  WBC 7.5 7.9 9.6  NEUTROABS 5.1  --   --   HGB 12.5* 11.6* 11.1*  HCT 38.2* 35.1* 33.8*  MCV 92.5 92.6 92.6  PLT 140* 125* 140*   Basic Metabolic Panel:  Recent Labs Lab 03/19/16 1036 03/20/16 0327 03/21/16 0536  NA 147* 149* 149*  K 3.1* 3.7 4.0  CL 115* 119* 120*  CO2 22 23 22   GLUCOSE 145* 175* 142*  BUN 28* 23* 16  CREATININE 1.19 0.91 0.83  CALCIUM 7.5* 7.7* 8.2*   GFR: Estimated Creatinine Clearance: 65.6 mL/min (by C-G formula  based on SCr of 0.83 mg/dL). Liver Function Tests:  Recent Labs Lab 03/19/16 1036 03/20/16 0327  AST 43* 41  ALT 73* 62  ALKPHOS 57 58  BILITOT 0.6 0.7  PROT 6.5 6.3*  ALBUMIN 2.7* 2.7*   No results for input(s): LIPASE, AMYLASE in the last 168 hours. No results for input(s): AMMONIA in the last 168 hours. Coagulation Profile:  Recent Labs Lab 03/19/16 1715  INR 1.31   Cardiac Enzymes: No results for input(s): CKTOTAL, CKMB, CKMBINDEX, TROPONINI in the last 168 hours. BNP (last 3 results) No results for input(s): PROBNP in the last 8760 hours. HbA1C: No results for input(s): HGBA1C in the last 72 hours. CBG: No results for input(s): GLUCAP in the last 168 hours. Lipid Profile: No results for input(s): CHOL, HDL, LDLCALC, TRIG, CHOLHDL, LDLDIRECT in the last 72 hours. Thyroid Function Tests: No results for input(s): TSH, T4TOTAL, FREET4, T3FREE, THYROIDAB in the last 72 hours. Anemia Panel: No results for input(s): VITAMINB12, FOLATE, FERRITIN, TIBC, IRON, RETICCTPCT in the last 72 hours. Urine analysis:    Component Value Date/Time   COLORURINE YELLOW 03/19/2016 1033   APPEARANCEUR CLOUDY (A) 03/19/2016 1033   LABSPEC 1.019 03/19/2016 1033   PHURINE 8.0 03/19/2016 1033   GLUCOSEU NEGATIVE 03/19/2016 1033   HGBUR SMALL (A) 03/19/2016 1033   BILIRUBINUR NEGATIVE 03/19/2016 1033   KETONESUR NEGATIVE 03/19/2016 1033   PROTEINUR 100 (A) 03/19/2016 1033   NITRITE NEGATIVE 03/19/2016 1033   LEUKOCYTESUR LARGE (A) 03/19/2016 1033     ) Recent Results (from the past 240 hour(s))  Urine culture     Status: Abnormal   Collection Time: 03/19/16 10:33 AM  Result Value Ref Range Status   Specimen Description URINE, CLEAN CATCH  Final   Special Requests NONE  Final   Culture MULTIPLE SPECIES PRESENT, SUGGEST RECOLLECTION (A)  Final   Report Status 03/20/2016 FINAL  Final  Blood Culture (routine x 2)     Status: None (Preliminary result)   Collection Time: 03/19/16  10:37 AM  Result Value Ref Range Status   Specimen Description BLOOD RIGHT ARM  Final   Special Requests BOTTLES DRAWN AEROBIC AND ANAEROBIC 5CC  Final   Culture   Final    NO GROWTH 1 DAY Performed at [facility]    Report Status PENDING  Incomplete  Blood Culture (routine x 2)     Status: None (Preliminary result)   Collection Time: 03/19/16 10:58 AM  Result Value Ref Range Status   Specimen Description BLOOD LEFT FOREARM  Final   Special Requests BOTTLES DRAWN AEROBIC AND ANAEROBIC  Final   Culture   Final    NO GROWTH 1 DAY Performed at [facility]    Report Status PENDING  Incomplete  MRSA PCR Screening     Status: None  Collection Time: 03/19/16  2:30 PM  Result Value Ref Range Status   MRSA by PCR NEGATIVE NEGATIVE Final    Comment:        The GeneXpert MRSA Assay (FDA approved for NASAL specimens only), is one component of a comprehensive MRSA colonization surveillance program. It is not intended to diagnose MRSA infection nor to guide or monitor treatment for MRSA infections.       Anti-infectives    Start     Dose/Rate Route Frequency Ordered Stop   03/20/16 1000  vancomycin (VANCOCIN) IVPB 1000 mg/200 mL premix     1,000 mg 200 mL/hr over 60 Minutes Intravenous Every 24 hours 03/19/16 1201     03/19/16 2000  piperacillin-tazobactam (ZOSYN) IVPB 3.375 g     3.375 g 12.5 mL/hr over 240 Minutes Intravenous Every 8 hours 03/19/16 1201     03/19/16 1100  piperacillin-tazobactam (ZOSYN) IVPB 3.375 g     3.375 g 12.5 mL/hr over 240 Minutes Intravenous  Once 03/19/16 1059 03/19/16 1521   03/19/16 1045  ceFEPIme (MAXIPIME) 2 g in dextrose 5 % 50 mL IVPB  Status:  Discontinued     2 g 100 mL/hr over 30 Minutes Intravenous  Once 03/19/16 1033 03/19/16 1059   03/19/16 1045  vancomycin (VANCOCIN) IVPB 1000 mg/200 mL premix     1,000 mg 200 mL/hr over 60 Minutes Intravenous  Once 03/19/16 1033 03/19/16 1204       Radiology Studies: Dg  Swallowing Func-speech Pathology  Result Date: 03/20/2016 Objective Swallowing Evaluation: Type of Study: MBS-Modified Barium Swallow Study Patient Details Name: Kristopher Jennings MRN: [id number] Date of Birth: [birth year] Today's Date: 03/20/2016 Time: SLP Start Time (ACUTE ONLY): 1116-SLP Stop Time (ACUTE ONLY): 1126 SLP Time Calculation (min) (ACUTE ONLY): 10 min Past Medical History: Past Medical History: Diagnosis Date . Anxiety  . Arthritis  . Ataxia  . Cauda equina syndrome (HCC)  . Depression  . Dysphagia  . Essential Tremors  . Paralysis agitans (HCC) 01/12/2013 . Thyroid disease  . UTI (lower urinary tract infection)  Past Surgical History: Past Surgical History: Procedure Laterality Date . LAMINECTOMY   . SPINE SURGERY   HPI: 75 year old man PMH Parkinson's disease, stroke, presented with history fever, shortness of breath and altered mental status; sepsis suspected. CXR right lower lobe pneumonia. Outpatient MBS 10/27/15 revealed penetration and aspiration mostly silent. Chin tuck increased airway protection with nectar and reduced residue in valleculae. Dys 1 texture and nectar thick with chin tuck recommended Subjective: I have been doing chin tuck when I remember Assessment / Plan / Recommendation CHL IP CLINICAL IMPRESSIONS 03/20/2016 Therapy Diagnosis Mild oral phase dysphagia;Moderate pharyngeal phase dysphagia Clinical Impression Pt exhibits a moderate oropharyngeal dysphagia with decreased lingual manipulation/propulsion of boluses, resulting in intermittent left-sided anterior loss, prolonged oral phase and oral residue; Pharyngeal trigger is delayed to level of valleculae and/or pyriform sinuses with nectar-thick liquids entering the airway before the swallow with larger amounts and penetrated into the laryngeal vestibule with smaller amounts via cup, but reduced significantly and/or eliminated with 1/2 tsp amounts, with penetration and aspiration silent; Mod cues for repetitive swallows and chin  tuck used for larger volume (cup/straw) without change; Vallecular/pyriform and pharyngeal wall residue noted, but cleared with 2-3 swallows per bite/sip; honey-thickened liquids attempted, but more difficult to clear residue with this consistency, despite mod cues from SLP.  Recommend continue Dysphagia 1/nectar-thick liquids with 1/2 tsp amounts, repetitive swallows (2-3 per bite/sip) and effortful swallow as well.  Moderate aspiration  risk; Pt would benefit from ST for pharyngeal strengthening and diet tolerance; may consider NPO status if pt continues to decondition and dysphagia worsens secondary to nature of PD. Discussed with pt results and recommendations and he is aware of aspiration risk associated with po intake and possible need for non-oral intake if diet tolerance is not successful. Impact on safety and function Severe aspiration risk;Moderate aspiration risk   CHL IP TREATMENT RECOMMENDATION 03/20/2016 Treatment Recommendations Therapy as outlined in treatment plan below   Prognosis 03/20/2016 Prognosis for Safe Diet Advancement Fair Barriers to Reach Goals Severity of deficits Barriers/Prognosis Comment -- CHL IP DIET RECOMMENDATION 03/20/2016 SLP Diet Recommendations Dysphagia 1 (Puree) solids;Nectar thick liquid;Other (Comment) Liquid Administration via [employer];No straw Medication Administration Crushed with puree Compensations Minimize environmental distractions;Slow rate;Small sips/bites;Multiple dry swallows after each bite/sip;Effortful swallow Postural Changes Seated upright at 90 degrees   CHL IP OTHER RECOMMENDATIONS 03/20/2016 Recommended Consults -- Oral Care Recommendations Oral care BID Other Recommendations Order thickener from pharmacy;Have oral suction available;Remove water pitcher;Prohibited food (jello, ice cream, thin soups)   CHL IP FOLLOW UP RECOMMENDATIONS 03/20/2016 Follow up Recommendations Skilled Nursing facility;24 hour supervision/assistance   CHL IP FREQUENCY AND DURATION  03/20/2016 Speech Therapy Frequency (ACUTE ONLY) min 3x week Treatment Duration 1 week      CHL IP ORAL PHASE 03/20/2016 Oral Phase Impaired Oral - Pudding Teaspoon -- Oral - Pudding Cup -- Oral - Honey Teaspoon Reduced posterior propulsion;Lingual/palatal residue Oral - Honey Cup -- Oral - Nectar Teaspoon Reduced posterior propulsion;Delayed oral transit;Lingual/palatal residue;Premature spillage Oral - Nectar Cup Reduced posterior propulsion;Premature spillage;Decreased bolus cohesion;Delayed oral transit Oral - Nectar Straw (No Data) Oral - Thin Teaspoon -- Oral - Thin Cup -- Oral - Thin Straw -- Oral - Puree Reduced posterior propulsion;Lingual/palatal residue;Delayed oral transit Oral - Mech Soft -- Oral - Regular -- Oral - Multi-Consistency -- Oral - Pill -- Oral Phase - Comment --  CHL IP PHARYNGEAL PHASE 03/20/2016 Pharyngeal Phase Impaired Pharyngeal- Pudding Teaspoon -- Pharyngeal -- Pharyngeal- Pudding Cup -- Pharyngeal -- Pharyngeal- Honey Teaspoon Delayed swallow initiation-pyriform sinuses;Reduced epiglottic inversion;Reduced anterior laryngeal mobility;Reduced laryngeal elevation;Reduced tongue base retraction;Pharyngeal residue - valleculae;Pharyngeal residue - pyriform;Pharyngeal residue - posterior pharnyx Pharyngeal -- Pharyngeal- Honey Cup -- Pharyngeal -- Pharyngeal- Nectar Teaspoon Delayed swallow initiation-pyriform sinuses;Reduced anterior laryngeal mobility;Reduced laryngeal elevation;Reduced epiglottic inversion;Reduced tongue base retraction;Pharyngeal residue - valleculae;Pharyngeal residue - pyriform Pharyngeal -- Pharyngeal- Nectar Cup Delayed swallow initiation-pyriform sinuses;Reduced epiglottic inversion;Reduced anterior laryngeal mobility;Reduced laryngeal elevation;Reduced tongue base retraction;Penetration/Aspiration before swallow Pharyngeal Material enters airway, remains ABOVE vocal cords and not ejected out;Other (Comment);Material enters airway, passes BELOW cords and not  ejected out despite cough attempt by patient Pharyngeal- Nectar Straw -- Pharyngeal -- Pharyngeal- Thin Teaspoon -- Pharyngeal -- Pharyngeal- Thin Cup -- Pharyngeal -- Pharyngeal- Thin Straw -- Pharyngeal -- Pharyngeal- Puree Delayed swallow initiation-vallecula;Reduced anterior laryngeal mobility;Reduced laryngeal elevation;Reduced tongue base retraction;Pharyngeal residue - valleculae;Pharyngeal residue - pyriform;Pharyngeal residue - posterior pharnyx Pharyngeal -- Pharyngeal- Mechanical Soft -- Pharyngeal -- Pharyngeal- Regular -- Pharyngeal -- Pharyngeal- Multi-consistency -- Pharyngeal -- Pharyngeal- Pill -- Pharyngeal -- Pharyngeal Comment --  CHL IP CERVICAL ESOPHAGEAL PHASE 03/20/2016 Cervical Esophageal Phase WFL Pudding Teaspoon -- Pudding Cup -- Honey Teaspoon -- Honey Cup -- Nectar Teaspoon -- Nectar Cup -- Nectar Straw -- Thin Teaspoon -- Thin Cup -- Thin Straw -- Puree -- Mechanical Soft -- Regular -- Multi-consistency -- Pill -- Cervical Esophageal Comment -- CHL IP GO 03/20/2016 Functional Assessment Tool Used NOMS Functional Limitations Swallowing Swallow Current  Status [id number](G8996) CL Swallow Goal Status (M8413(G8997) CL Swallow Discharge Status (K4401(G8998) CL Motor Speech Current Status [phone](G8999) (None) Motor Speech Goal Status (D6644(G9186) (None) Motor Speech Goal Status (I3474(G9158) (None) Spoken Language Comprehension Current Status [phone](G9159) (None) Spoken Language Comprehension Goal Status (L8756(G9160) (None) Spoken Language Comprehension Discharge Status [phone](G9161) (None) Spoken Language Expression Current Status [phone](G9162) (None) Spoken Language Expression Goal Status [phone](G9163) (None) Spoken Language Expression Discharge Status [phone](G9164) (None) Attention Current Status (F0932(G9165) (None) Attention Goal Status (T5573(G9166) (None) Attention Discharge Status [phone](G9167) (None) Memory Current Status (K2706(G9168) (None) Memory Goal Status (C3762(G9169) (None) Memory Discharge Status (G3151(G9170) (None) Voice Current Status (V6160(G9171) (None) Voice Goal Status (V3710(G9172)  (None) Voice Discharge Status [phone](G9173) (None) Other Speech-Language Pathology Functional Limitation [phone](G9174) (None) Other Speech-Language Pathology Functional Limitation Goal Status (V0350(G9175) (None) Other Speech-Language Pathology Functional Limitation Discharge Status (K9381(G9176) (None) ADAMS,PAT, M.S., CCC-SLP 03/20/2016, 12:06 PM                   Scheduled Meds: . buPROPion  200 mg Oral Q breakfast  . clonazePAM  0.5 mg Oral Daily  . Dextromethorphan-Quinidine  1 capsule Oral BID  . levothyroxine  175 mcg Oral QAC breakfast  . mirtazapine  7.5 mg Oral QHS  . piperacillin-tazobactam (ZOSYN)  IV  3.375 g Intravenous Q8H  . polyvinyl alcohol  1 drop Both Eyes QHS  . QUEtiapine  50 mg Oral BID  . sodium chloride flush  3 mL Intravenous Q12H  . tamsulosin  0.8 mg Oral QHS  . vancomycin  1,000 mg Intravenous Q24H   Continuous Infusions: . dextrose 5 % with KCl 20 mEq / L 20 mEq (03/21/16 1013)     LOS: 2 days    Time spent: 35 min    Sylvie Mifsud U Amiley Shishido, DO [facility] Pager [phone]  If 7PM-7AM, please contact night-coverage [email or web address] Password [email or web address] 03/21/2016, 11:05 AM

## 2016-03-21 NOTE — Progress Notes (Signed)
No charge note  Palliative consult request received Discussed with Dr Benjamine MolaVann Chart reviewed Patient seen this morning, resting in bed, with assistance, was able to eat quite a bit of his breakfast.  Mask like facies, slow to respond, has lots of congestion. He is how ever, still able to answer few questions appropriately.   Call placed, unable to reach son. Will schedule goals of care meeting with son and patient on 03-23-15.  Thank you for the consult.  Rosalin HawkingZeba Shahrzad Koble MD [facility] [phone]

## 2016-03-22 DIAGNOSIS — Z515 Encounter for palliative care: Secondary | ICD-10-CM

## 2016-03-22 DIAGNOSIS — Z7189 Other specified counseling: Secondary | ICD-10-CM

## 2016-03-22 NOTE — Consult Note (Signed)
Consultation Note Date: 03/22/2016   Patient Name: Kristopher Jennings  DOB: 1940/08/25  MRN: [id number]  Age / Sex: 76 y.o., male  PCP: Renford Dills, MD Referring Physician: Maxie Barb, MD  Reason for Consultation: Establishing goals of care  HPI/Patient Profile: 76 y.o. male  with past medical history of parkinson's disease admitted on 03/19/2016 with .   Clinical Assessment and Goals of Care:  76 year old gentleman with Parkinson's disease, stroke presented with fever shortness of breath admitted to hospitalist service with right lower lobe infiltrate initially also with sepsis criteria. A shunt has been receiving IV fluids and antibiotics. He has been seen by speech therapy with recommendations for dysphagia diet 1. Palliative care consultation for goals of care discussions.  Patient is an elderly gentleman sitting in bed. He does appear to have generalized weakness, masklike feces. He is slow to talk but does respond appropriately to questions asked, does not elaborate much. Does not appear to be in any acute distress. Call placed and discussed with son [name]. Introduce myself and palliative care as follows: Palliative medicine is specialized medical care for people living with serious illness. It focuses on providing relief from the symptoms and stress of a serious illness. The goal is to improve quality of life for both the patient and the family.  Brief life review performed. Patient used to live by himself in [place], [place]. Several years ago, he developed benign essential tremor and underwent [facility] and had deep brain stimulator implanted. Subsequently the patient had a stroke. Patient has had ongoing rigidity and was ultimately diagnosed with Parkinson's. Patient has seen several neurology specialist in the past. He has  been placed on Sinemet. He continues to have ongoing gradual progressive decline functionally as well as cognitively. He has not been able to do much with physical and occupational therapies and recent nursing facility settings. Patient continues to have her downhill trajectory. Son states that he has had goals of care discussions, advance care planning discussions with the patient. The patient has told him that "there are things worse than dying."   Discussed about Parkinson trajectory, discussed about ongoing functional and cognitive decline trajectory. Discussed that the patient may not be able to eat enough to sustain himself for very long. Son states that the patient was very clear about not undergoing heroic/aggressive measures. Discussed about importance of careful hand assisted feeding. Son would really like for the patient to go to a different skilled nursing facility towards the end of this hospitalization. He is accepting of ongoing palliative follow-up at the new skilled nursing facility, discussed with son about the possibility of the patient ending up on full hospice services in the near future based on his trajectory. All questions answered to the best of my ability. See recommendations below. Thank you for the consult.  HCPOA  son [name]   SUMMARY OF RECOMMENDATIONS   DNR DNI No aggressive measures Careful hand assisted feeding, follow aspiration precautions SNF  with palliative on d/c, monitor trajectory, may need full hospice care within the next few weeks to months  Code Status/Advance Care Planning:  DNR    Symptom Management:    Continue current mode of care Palliative Prophylaxis:   Bowel Regimen  Psycho-social/Spiritual:   Desire for further Chaplaincy support:no  Additional Recommendations: Caregiving  Support/Resources  Prognosis:   < 12 months  Discharge Planning: Skilled Nursing Facility for rehab with Palliative care service follow-up       Primary Diagnoses: Present on Admission: . Aspiration pneumonia (HCC) . Paralysis agitans (HCC)   I have reviewed the medical record, interviewed the patient and family, and examined the patient. The following aspects are pertinent.  Past Medical History:  Diagnosis Date  . Anxiety   . Arthritis   . Ataxia   . Cauda equina syndrome (HCC)   . Depression   . Dysphagia   . Essential Tremors   . Paralysis agitans (HCC) 01/12/2013  . Thyroid disease   . UTI (lower urinary tract infection)    Social History   Social History  . Marital status: Divorced    Spouse name: N/A  . Number of children: N/A  . Years of education: N/A   Social History Main Topics  . Smoking status: Former Games developer  . Smokeless tobacco: Never Used  . Alcohol use No  . Drug use: Unknown  . Sexual activity: No   Other Topics Concern  . None   Social History Narrative  . None   No family history on file. Scheduled Meds: . buPROPion  200 mg Oral Q breakfast  . clonazePAM  0.5 mg Oral Daily  . Dextromethorphan-Quinidine  1 capsule Oral BID  . levothyroxine  175 mcg Oral QAC breakfast  . mirtazapine  7.5 mg Oral QHS  . piperacillin-tazobactam (ZOSYN)  IV  3.375 g Intravenous Q8H  . polyvinyl alcohol  1 drop Both Eyes QHS  . QUEtiapine  50 mg Oral BID  . sodium chloride flush  3 mL Intravenous Q12H  . tamsulosin  0.8 mg Oral QHS   Continuous Infusions: . dextrose 5 % with KCl 20 mEq / L 20 mEq (03/22/16 0455)   PRN Meds:.acetaminophen **OR** acetaminophen, LORazepam, RESOURCE THICKENUP CLEAR Medications Prior to Admission:  Prior to Admission medications   Medication Sig Start Date End Date Taking? Authorizing Provider  acetaminophen (TYLENOL) 325 MG tablet Take 650 mg by mouth every 4 (four) hours as needed for mild pain, moderate pain, fever or headache.    Yes Historical Provider, MD  buPROPion (WELLBUTRIN SR) 200 MG 12 hr tablet Take 200 mg by mouth daily before breakfast.    Yes  Historical Provider, MD  Cholecalciferol (VITAMIN D) 2000 units CAPS Take 2,000 Units by mouth daily.    Yes Historical Provider, MD  clonazePAM (KLONOPIN) 0.5 MG tablet Take 0.5 mg by mouth daily.   Yes Historical Provider, MD  Dextromethorphan-Quinidine (NUEDEXTA) 20-10 MG CAPS Take 1 capsule by mouth 2 (two) times daily.   Yes Historical Provider, MD  diclofenac sodium (VOLTAREN) 1 % GEL Apply 2 g topically 4 (four) times daily as needed (for pain). Pt applies to hips and heels.   Yes Historical Provider, MD  docusate (COLACE) 50 MG/5ML liquid Take 100 mg by mouth 2 (two) times daily.   Yes Historical Provider, MD  levothyroxine (SYNTHROID, LEVOTHROID) 175 MCG tablet Take 175 mcg by mouth daily before breakfast.   Yes Historical Provider, MD  Melatonin 3 MG TABS Take 3 mg  by mouth at bedtime.   Yes Historical Provider, MD  mirtazapine (REMERON) 7.5 MG tablet Take 7.5 mg by mouth at bedtime.   Yes Historical Provider, MD  QUEtiapine (SEROQUEL) 50 MG tablet Take 50 mg by mouth 2 (two) times daily.   Yes Historical Provider, MD  senna (SENOKOT) 8.6 MG TABS tablet Take 1 tablet by mouth at bedtime.   Yes Historical Provider, MD  tamsulosin (FLOMAX) 0.4 MG CAPS capsule Take 0.8 mg by mouth at bedtime.    Yes Historical Provider, MD   Allergies  Allergen Reactions  . Demerol [Meperidine] Other (See Comments)    Reaction:  Unknown   . Lactose Intolerance (Gi) Other (See Comments)    Reaction:  Unknown   . Nickel Other (See Comments)    Reaction:  Unknown   . Oxycodone Other (See Comments)    Reaction:  Unknown    Review of Systems Denies pain  Physical Exam General exam: elderly, chronically ill Respiratory system: Clear to auscultation, no wheezing Cardiovascular system: S1 & S2 heard, RRR. No JVD, murmurs, rubs, gallops or clicks. No pedal edema. Gastrointestinal system: Abdomen is nondistended, soft and nontender. No organomegaly or masses felt. Normal bowel sounds heard. Central  nervous system: Alert   Vital Signs: BP (!) 107/59 (BP Location: Left Arm)   Pulse 74   Temp 98.3 F (36.8 C) (Oral)   Resp (!) 29   Ht 5\' 10"  (1.778 m)   Wt 58.5 kg (129 lb) Comment: verbal from RN from a couple of years ago, cannot obtain wt  SpO2 96%   BMI 18.51 kg/m  Pain Assessment: No/denies pain POSS *See Group Information*: 1-Acceptable,Awake and alert Pain Score: Asleep   SpO2: SpO2: 96 % O2 Device:SpO2: 96 % O2 Flow Rate: .O2 Flow Rate (L/min): 2 L/min  IO: Intake/output summary:  Intake/Output Summary (Last 24 hours) at 03/22/16 0920 Last data filed at 03/22/16 0800  Gross per 24 hour  Intake             1970 ml  Output             1600 ml  Net              370 ml    LBM: Last BM Date: 03/20/16 Baseline Weight: Weight: 58.5 kg (129 lb) (verbal from RN from a couple of years ago, cannot obtain wt) Most recent weight: Weight: 58.5 kg (129 lb) (verbal from RN from a couple of years ago, cannot obtain wt)     Palliative Assessment/Data:   Flowsheet Rows   Flowsheet Row Most Recent Value  Intake Tab  Referral Department  Hospitalist  Unit at Time of Referral  Med/Surg Unit  Palliative Care Primary Diagnosis  Neurology  Palliative Care Type  New Palliative care  Reason for referral  Clarify Goals of Care  Date first seen by Palliative Care  03/21/16  Clinical Assessment  Palliative Performance Scale Score  30%  Pain Max last 24 hours  4  Pain Min Last 24 hours  3  Dyspnea Max Last 24 Hours  4  Dyspnea Min Last 24 hours  3  Nausea Max Last 24 Hours  3  Nausea Min Last 24 Hours  2  Anxiety Max Last 24 Hours  3  Anxiety Min Last 24 Hours  2  Psychosocial & Spiritual Assessment  Palliative Care Outcomes  Patient/Family meeting held?  Yes  Who was at the meeting?  patient son over the phone.  Palliative Care Outcomes  Clarified goals of care      Time In:  9 Time Out:  10 Time Total:  60 min  Greater than 50%  of this time was spent counseling and  coordinating care related to the above assessment and plan.  Signed by: Rosalin HawkingZeba Jemmie Rhinehart, MD  [phone]  Please contact Palliative Medicine Team phone at [phone] for questions and concerns.  For individual provider: See Loretha StaplerAmion

## 2016-03-22 NOTE — Clinical Social Work Note (Signed)
Clinical Social Work Assessment  Patient Details  Name: Kristopher Jennings MRN: [id number] Date of Birth: 11-Mar-1941  Date of referral:  03/22/16               Reason for consult:  Facility Placement                Permission sought to share information with:  Facility Medical sales representativeContact Representative, Family Supports Permission granted to share information::     Name::     Annett GulaRuss Jennings  Agency::  SNF  Relationship::  Son   Contact Information:  [phone]  Housing/Transportation Living arrangements for the past 2 months:  Skilled Building surveyorursing Facility Source of Information:  Adult Children Patient Interpreter Needed:  None Criminal Activity/Legal Involvement Pertinent to Current Situation/Hospitalization:    Significant Relationships:  Adult Children Lives with:  Facility Resident Do you feel safe going back to the place where you live?  No Need for family participation in patient care:  Yes (Comment)  Care giving concerns: Patient son [facility]. At this time he does not feel the patient health has decline being at facility and is interested in patient going to another facility, to continue long term care.    Social Worker assessment / plan:  LCSWA spoke with pt. Son [phone] by phone, he expressed concerns with patient care at present facility and the need for palliative. LCSWA explained snf process and snf list once bed offers have been given. Son [place].  Unable to locate PASRR-will follow up with facility.  fl2 completed. Will follow up with bed offers  Employment status:  Other (Comment), Retired Health and safety inspectornsurance information:    PT Recommendations:  24 Hour Supervision, Skilled Nursing Facility Information / Referral to community resources:     Patient/Family's Response to care:  Agreeable to patient care.   Patient/Family's Understanding of and Emotional Response to Diagnosis,  Current Treatment, and Prognosis:  Patient family understands the patient current treatment and need for further long term care at SNF with palliative.   Emotional Assessment Appearance:  Appears stated age Attitude/Demeanor/Rapport:    Affect (typically observed):  Accepting Orientation:  Oriented to Self, Oriented to Place, Oriented to Situation Alcohol / Substance use:  Not Applicable Psych involvement (Current and /or in the community):  No (Comment)  Discharge Needs  Concerns to be addressed:  Care Coordination, Discharge Planning Concerns Readmission within the last 30 days:  No Current discharge risk:  Dependent with Mobility Barriers to Discharge:  Continued Medical Work up   [employer] A Orlie Cundari, LCSW 03/22/2016, 9:55 AM

## 2016-03-22 NOTE — Progress Notes (Signed)
Pharmacy Antibiotic Note  Kristopher MerinoRussell Jennings is a 76 y.o. male admitted on 03/19/2016 with pneumonia and sepsis.  Pharmacy has been consulted for Vanc/Zosyn dosing.  Plan:  Continue Zosyn 3.375 g IV given every 8 hrs by 4-hr infusion  BCx clear x 2 days; consider narrowing to Augmentin if otherwise clinically stable  Renal function wnl and stable; pharmacy to sign off   Height: 5\' 10"  (177.8 cm) Weight: 129 lb (58.5 kg) (verbal from RN from a couple of years ago, cannot obtain wt) IBW/kg (Calculated) : 73  Temp (24hrs), Avg:98.4 F (36.9 C), Min:98.3 F (36.8 C), Max:98.4 F (36.9 C)   Recent Labs Lab 03/19/16 1036 03/19/16 1122 03/19/16 1402 03/20/16 0327 03/21/16 0536  WBC 7.5  --   --  7.9 9.6  CREATININE 1.19  --   --  0.91 0.83  LATICACIDVEN  --  1.22 1.29  --   --     Estimated Creatinine Clearance: 65.6 mL/min (by C-G formula based on SCr of 0.83 mg/dL).    Allergies  Allergen Reactions  . Demerol [Meperidine] Other (See Comments)    Reaction:  Unknown   . Lactose Intolerance (Gi) Other (See Comments)    Reaction:  Unknown   . Nickel Other (See Comments)    Reaction:  Unknown   . Oxycodone Other (See Comments)    Reaction:  Unknown     Thank you for allowing pharmacy to be a part of this patient's care.   Bernadene Personrew Charleton Deyoung, PharmD, BCPS Pager: [phone] 03/22/2016, 11:33 AM

## 2016-03-22 NOTE — NC FL2 (Addendum)
[place] MEDICAID FL2 LEVEL OF CARE SCREENING TOOL     IDENTIFICATION  Patient Name: Kristopher MerinoRussell Lasch Birthdate: 10-19-1940 Sex: male Admission Date (Current Location): 03/19/2016  [facility] [place]   Facility and Address:  [facility],  [address], [place] [id number]      Provider Number: [id number]  Attending Physician Name and Address:  Maxie Barbron Prasad Bhandari, MD  Relative Name and Phone Number:       Current Level of Care: SNF Recommended Level of Care: Skilled Nursing Facility Prior Approval Number:    Date Approved/Denied:   PASRR Number:   [id number] [id number]   Discharge Plan: SNF    Current Diagnoses: Patient Active Problem List   Diagnosis Date Noted  . Pressure injury of skin 03/20/2016  . Lobar pneumonia (HCC) 03/19/2016  . Sepsis, unspecified organism (HCC) 03/19/2016  . Acute encephalopathy 03/19/2016  . Aspiration pneumonia (HCC) 03/19/2016  . BPH (benign prostatic hyperplasia) 01/01/2014  . Thyroid activity decreased 10/04/2013  . Fever, unspecified 10/03/2013  . Unspecified constipation 10/02/2013  . Other specified disease of white blood cells 10/02/2013  . Cough 05/03/2013  . Urinary tract infection, site not specified 02/26/2013  . Urinary hesitancy 02/19/2013  . Paralysis agitans (HCC) 01/12/2013  . Essential hypertension, benign 01/08/2013  . Hypertrophy of prostate without urinary obstruction and other lower urinary tract symptoms (LUTS) 01/08/2013  . Anxiety state, unspecified 01/08/2013  . Benign essential tremor 12/13/2012  . H/O urinary retention 12/13/2012  . Hypothyroidism 12/13/2012  . Insomnia 12/13/2012    Orientation RESPIRATION BLADDER Height & Weight     Self  Normal, O2 (2L) External catheter Weight: 129 lb (58.5 kg) (verbal from RN from a couple of years ago, cannot obtain wt) Height:  5\' 10"  (177.8 cm)  BEHAVIORAL SYMPTOMS/MOOD NEUROLOGICAL BOWEL NUTRITION STATUS     (Dementia/  Parkinsons) Incontinent Diet (dysphagia with nectar thick )  AMBULATORY STATUS COMMUNICATION OF NEEDS Skin   Extensive Assist Verbally Other (Comment), PU Stage and Appropriate Care (Sacrum Blancable redness PRN)                       Personal Care Assistance Level of Assistance  Bathing, Feeding, Dressing Bathing Assistance: Maximum assistance Feeding assistance: Maximum assistance Dressing Assistance: Maximum assistance     Functional Limitations Info  Sight, Hearing, Speech Sight Info: Adequate Hearing Info: Adequate Speech Info: Adequate    SPECIAL CARE FACTORS FREQUENCY  PT (By licensed PT), Speech therapy     PT Frequency: 5       Speech Therapy Frequency: 5      Contractures Contractures Info: Not present    Additional Factors Info  Code Status, Allergies, Psychotropic Code Status Info: DNR Allergies Info: Demerol Meperidine, Lactose Intolerance (Gi), Nickel, Oxycodone Psychotropic Info: Medicaitions: Klonopin, Wellbutrin,Seroquel          Current Medications (03/22/2016):  This is the current hospital active medication list Current Facility-Administered Medications  Medication Dose Route Frequency Provider Last Rate Last Dose  . acetaminophen (TYLENOL) tablet 650 mg  650 mg Oral Q6H PRN Standley Brookinganiel P Goodrich, MD   650 mg at 03/21/16 2117   Or  . acetaminophen (TYLENOL) suppository 650 mg  650 mg Rectal Q6H PRN Standley Brookinganiel P Goodrich, MD      . buPROPion [facility](WELLBUTRIN SR) 12 hr tablet 200 mg  200 mg Oral Q breakfast Joseph ArtJessica U Vann, DO   200 mg at 03/22/16 [id number]  . clonazePAM (KLONOPIN) tablet 0.5  mg  0.5 mg Oral Daily Joseph Art, DO   0.5 mg at 03/21/16 1010  . Dextromethorphan-Quinidine 20-10 MG CAPS 1 capsule  1 capsule Oral BID Joseph Art, DO   1 capsule at 03/21/16 2114  . dextrose 5 % with KCl 20 mEq / L  infusion  20 mEq Intravenous Continuous Joseph Art, DO 75 mL/hr at 03/22/16 0455 20 mEq at 03/22/16 0455  . levothyroxine (SYNTHROID, LEVOTHROID)  tablet 175 mcg  175 mcg Oral QAC breakfast Joseph Art, DO   175 mcg at 03/22/16 7829  . LORazepam (ATIVAN) injection 0.5 mg  0.5 mg Intravenous Q8H PRN Standley Brooking, MD      . mirtazapine (REMERON) tablet 7.5 mg  7.5 mg Oral QHS Selinda Orion Vann, DO   7.5 mg at 03/21/16 2200  . piperacillin-tazobactam (ZOSYN) IVPB 3.375 g  3.375 g Intravenous Q8H Hessie Knows, RPH   3.375 g at 03/22/16 0455  . polyvinyl alcohol (LIQUIFILM TEARS) 1.4 % ophthalmic solution 1 drop  1 drop Both Eyes QHS Standley Brooking, MD   1 drop at 03/19/16 2128  . QUEtiapine (SEROQUEL) tablet 50 mg  50 mg Oral BID Joseph Art, DO   50 mg at 03/21/16 2115  . RESOURCE THICKENUP CLEAR   Oral PRN Joseph Art, DO      . sodium chloride flush (NS) 0.9 % injection 3 mL  3 mL Intravenous Q12H Standley Brooking, MD   3 mL at 03/21/16 2112  . tamsulosin (FLOMAX) capsule 0.8 mg  0.8 mg Oral QHS Joseph Art, DO   0.8 mg at 03/21/16 2113     Discharge Medications: Please see discharge summary for a list of discharge medications.  Relevant Imaging Results:  Relevant Lab Results:   Additional Information ssn#[id number]  Clearance Coots, LCSW

## 2016-03-22 NOTE — Progress Notes (Signed)
PROGRESS NOTE    Kristopher Jennings  ZOX:[id number] DOB: 1940/12/21 DOA: 03/19/2016 PCP: Katy Apo, MD   Brief Narrative: 76 year old man PMH Parkinson's disease, stroke, presented with history fever, shortness of breath and altered mental status. Initial evaluation revealed right lower lobe infiltrate, fever. Palliative care following. No waiting for SNF discharge. Discussed with the Child psychotherapist.  Assessment & Plan:  # Presumed sepsis secondary due to lobar pneumonia on admission: Clinically improving. - Currently on IV Zosyn. I will continue antibiotics, patient has low-grade temperature today. -Continue supportive care.  #Right lower lobe pneumonia likely aspiration pneumonia: Continue antibiotics as discussed above. May be able to switch to oral Augmentin.  #hypokalemia: Improved. Encourage oral intake for the water, monitor BMP for hypernatremia. Minimize lab check. Patient will be more comfort care.  #History of dementia and Parkinson disease, unspecified: Patient is DO NOT RESUSCITATE DO NOT INTUBATE, no aggressive measures. Evaluated by palliative care service. May need hospice care on discharge.  #Pharyngeal dysphagia: Evaluated by swallow team. Currently on dysphagia 1 diet. Comfort measures.  Principal Problem:   Sepsis, unspecified organism [facility]) Active Problems:   Parkinson's disease (HCC)   Lobar pneumonia (HCC)   Acute encephalopathy   Aspiration pneumonia (HCC)   Pressure injury of skin   Encounter for palliative care   Goals of care, counseling/discussion  DVT prophylaxis: SCD. Comfort measures Code Status: DNR/DNI Family Communication: No family present at bedside Disposition Plan: Likely discharge to SNF when bed available.    Consultants:   Palliative care  Antimicrobials: Zosyn  Subjective: Patient was seen and examined at bedside. Patient denied chest pain shortness of breath. Unable to obtain review of system per the patient because of  possible dementia.   Objective: Vitals:   03/21/16 1502 03/21/16 2007 03/22/16 0405 03/22/16 1446  BP: 105/62 125/60 (!) 107/59 110/84  Pulse: 76 82 74 85  Resp: 20 19 (!) 29 (!) 22  Temp: 98.4 F (36.9 C) 98.4 F (36.9 C) 98.3 F (36.8 C) 100.3 F (37.9 C)  TempSrc: Axillary Axillary Oral Oral  SpO2: 97% 96% 96% 98%  Weight:      Height:        Intake/Output Summary (Last 24 hours) at 03/22/16 1530 Last data filed at 03/22/16 1431  Gross per 24 hour  Intake             2090 ml  Output             2800 ml  Net             -710 ml   Filed Weights   03/19/16 1115  Weight: 58.5 kg (129 lb)    Examination:  General exam: Ill-looking elderly male lying in bed, not in distress. Respiratory system: Bibasal decreased breath sound, respiratory effort normal. No wheezing appreciated. Cardiovascular system: S1 & S2 heard, RRR.  No pedal edema. Gastrointestinal system: Abdomen is nondistended, soft and nontender. Normal bowel sounds heard. Central nervous system: Alert awake but not oriented.  Skin: No rashes, lesions or ulcers Psychiatry: Judgement and insight appear impaired. Mood & affect flat.     Data Reviewed: I have personally reviewed following labs and imaging studies  CBC:  Recent Labs Lab 03/19/16 1036 03/20/16 0327 03/21/16 0536  WBC 7.5 7.9 9.6  NEUTROABS 5.1  --   --   HGB 12.5* 11.6* 11.1*  HCT 38.2* 35.1* 33.8*  MCV 92.5 92.6 92.6  PLT 140* 125* 140*   Basic Metabolic Panel:  Recent Labs  Lab 03/19/16 1036 03/20/16 0327 03/21/16 0536  NA 147* 149* 149*  K 3.1* 3.7 4.0  CL 115* 119* 120*  CO2 22 23 22   GLUCOSE 145* 175* 142*  BUN 28* 23* 16  CREATININE 1.19 0.91 0.83  CALCIUM 7.5* 7.7* 8.2*   GFR: Estimated Creatinine Clearance: 65.6 mL/min (by C-G formula based on SCr of 0.83 mg/dL). Liver Function Tests:  Recent Labs Lab 03/19/16 1036 03/20/16 0327  AST 43* 41  ALT 73* 62  ALKPHOS 57 58  BILITOT 0.6 0.7  PROT 6.5 6.3*    ALBUMIN 2.7* 2.7*   No results for input(s): LIPASE, AMYLASE in the last 168 hours. No results for input(s): AMMONIA in the last 168 hours. Coagulation Profile:  Recent Labs Lab 03/19/16 1715  INR 1.31   Cardiac Enzymes: No results for input(s): CKTOTAL, CKMB, CKMBINDEX, TROPONINI in the last 168 hours. BNP (last 3 results) No results for input(s): PROBNP in the last 8760 hours. HbA1C: No results for input(s): HGBA1C in the last 72 hours. CBG: No results for input(s): GLUCAP in the last 168 hours. Lipid Profile: No results for input(s): CHOL, HDL, LDLCALC, TRIG, CHOLHDL, LDLDIRECT in the last 72 hours. Thyroid Function Tests: No results for input(s): TSH, T4TOTAL, FREET4, T3FREE, THYROIDAB in the last 72 hours. Anemia Panel: No results for input(s): VITAMINB12, FOLATE, FERRITIN, TIBC, IRON, RETICCTPCT in the last 72 hours. Sepsis Labs:  Recent Labs Lab 03/19/16 1122 03/19/16 1402  LATICACIDVEN 1.22 1.29    Recent Results (from the past 240 hour(s))  Urine culture     Status: Abnormal   Collection Time: 03/19/16 10:33 AM  Result Value Ref Range Status   Specimen Description URINE, CLEAN CATCH  Final   Special Requests NONE  Final   Culture MULTIPLE SPECIES PRESENT, SUGGEST RECOLLECTION (A)  Final   Report Status 03/20/2016 FINAL  Final  Blood Culture (routine x 2)     Status: None (Preliminary result)   Collection Time: 03/19/16 10:37 AM  Result Value Ref Range Status   Specimen Description BLOOD RIGHT ARM  Final   Special Requests BOTTLES DRAWN AEROBIC AND ANAEROBIC 5CC  Final   Culture   Final    NO GROWTH 3 DAYS Performed at [facility]    Report Status PENDING  Incomplete  Blood Culture (routine x 2)     Status: None (Preliminary result)   Collection Time: 03/19/16 10:58 AM  Result Value Ref Range Status   Specimen Description BLOOD LEFT FOREARM  Final   Special Requests BOTTLES DRAWN AEROBIC AND ANAEROBIC  Final   Culture   Final    NO  GROWTH 3 DAYS Performed at [facility]    Report Status PENDING  Incomplete  MRSA PCR Screening     Status: None   Collection Time: 03/19/16  2:30 PM  Result Value Ref Range Status   MRSA by PCR NEGATIVE NEGATIVE Final    Comment:        The GeneXpert MRSA Assay (FDA approved for NASAL specimens only), is one component of a comprehensive MRSA colonization surveillance program. It is not intended to diagnose MRSA infection nor to guide or monitor treatment for MRSA infections.          Radiology Studies: No results found.      Scheduled Meds: . buPROPion  200 mg Oral Q breakfast  . clonazePAM  0.5 mg Oral Daily  . Dextromethorphan-Quinidine  1 capsule Oral BID  . levothyroxine  175 mcg  Oral QAC breakfast  . mirtazapine  7.5 mg Oral QHS  . piperacillin-tazobactam (ZOSYN)  IV  3.375 g Intravenous Q8H  . polyvinyl alcohol  1 drop Both Eyes QHS  . QUEtiapine  50 mg Oral BID  . sodium chloride flush  3 mL Intravenous Q12H  . tamsulosin  0.8 mg Oral QHS   Continuous Infusions: . dextrose 5 % with KCl 20 mEq / L 20 mEq (03/22/16 1430)     LOS: 3 days    Maurita Havener Jaynie CollinsPrasad Jin Shockley, MD [facility] Pager [phone]  If 7PM-7AM, please contact night-coverage [email or web address] Password [email or web address] 03/22/2016, 3:30 PM

## 2016-03-23 DIAGNOSIS — A419 Sepsis, unspecified organism: Principal | ICD-10-CM

## 2016-03-23 DIAGNOSIS — G20 Parkinson's disease: Secondary | ICD-10-CM

## 2016-03-23 DIAGNOSIS — J181 Lobar pneumonia, unspecified organism: Secondary | ICD-10-CM

## 2016-03-23 DIAGNOSIS — J690 Pneumonitis due to inhalation of food and vomit: Secondary | ICD-10-CM

## 2016-03-23 MED ORDER — AMOXICILLIN-POT CLAVULANATE 875-125 MG PO TABS: 1.0000 | ORAL_TABLET | Freq: Two times a day (BID) | ORAL | Status: AC

## 2016-03-23 NOTE — Discharge Summary (Signed)
Physician Discharge Summary  Kristopher Jennings ZOX:[id number] DOB: 1940-11-17 DOA: 03/19/2016  PCP: Katy Apo, MD  Admit date: 03/19/2016 Discharge date: 03/23/2016  Admitted From: [facility] Disposition: [facility]   Recommendations for Outpatient Follow-up:  1. Follow up with PCP in 1-2 weeks. 2. Pt requires careful full assist with feedings. 3. No aggressive measures. DNR 4. Monitor trajectory, continue palliative care services. May need full hospice service sin weeks to months.  5. Complete antibiotics (12/29 - 1/7) with augmentin BID.  Home Health: Per SNF Equipment/Devices: Per SNF Discharge Condition: Stable CODE STATUS: DNR Diet recommendation: Dysphagia 1 diet  Brief/Interim Summary: 76 year old man PMH Parkinson's disease s/p DBS, and stroke, presented with fever, shortness of breath and altered mental status. Initial evaluation revealed fever to 102.22F, hypotension in 90s/60s, normal lactate, and right lower lobe infiltrate. IV fluids and antibiotics were given for sepsis due to presumed aspiration pneumonia and the patient improved. Initially requiring oxygen, he has been weaned to room air. Speech therapy was consulted and recommended a dysphagia 1 diet. Palliative care was consulted for goals of care discussion given continued cognitive and functional decline. Due to inability to eat enough to sustain himself for long, it is possible that he would end up on full hospice services in the near future, but he will discharge back to SNF with palliative services at this time. This plan of care was discussed with his Son.   Discharge Diagnoses:  Principal Problem:   Sepsis, unspecified organism (HCC) Active Problems:   Parkinson's disease (HCC)   Lobar pneumonia (HCC)   Acute encephalopathy   Aspiration pneumonia (HCC)   Pressure injury of skin   Encounter for palliative care   Goals of care, counseling/discussion  # Presumed sepsis secondary due to lobar  pneumonia on admission: Clinically improving. - Resolved. Continue abx.  - Continue supportive care. - Blood cultures negative - No leukocytosis  #Right lower lobe pneumonia likely aspiration pneumonia: Continue antibiotics as discussed above.  - Switch to oral Augmentin prior to discharge. Complete 10 days of therapy on 1/7.   #Hypokalemia: Resolved. Minimize lab check. Patient will be more comfort care.  #History of dementia and Parkinson disease, unspecified: Patient is DO NOT RESUSCITATE DO NOT INTUBATE, no aggressive measures. Evaluated by palliative care service, will continue with palliative care  #Pharyngeal dysphagia: Evaluated by swallow team. Currently on dysphagia 1 diet. Comfort measures.  Discharge Instructions  Allergies as of 03/23/2016      Reactions   Demerol [meperidine] Other (See Comments)   Reaction:  Unknown    Lactose Intolerance (gi) Other (See Comments)   Reaction:  Unknown    Nickel Other (See Comments)   Reaction:  Unknown    Oxycodone Other (See Comments)   Reaction:  Unknown       Medication List    TAKE these medications   acetaminophen 325 MG tablet Commonly known as:  TYLENOL Take 650 mg by mouth every 4 (four) hours as needed for mild pain, moderate pain, fever or headache.   amoxicillin-clavulanate 875-125 MG tablet Commonly known as:  AUGMENTIN Take 1 tablet by mouth 2 (two) times daily.   buPROPion 200 MG 12 hr tablet Commonly known as:  WELLBUTRIN SR Take 200 mg by mouth daily before breakfast.   clonazePAM 0.5 MG tablet Commonly known as:  KLONOPIN Take 0.5 mg by mouth daily.   diclofenac sodium 1 % Gel Commonly known as:  VOLTAREN Apply 2 g topically 4 (four) times daily as  needed (for pain). Pt applies to hips and heels.   docusate 50 MG/5ML liquid Commonly known as:  COLACE Take 100 mg by mouth 2 (two) times daily.   levothyroxine 175 MCG tablet Commonly known as:  SYNTHROID, LEVOTHROID Take 175 mcg by mouth daily  before breakfast.   Melatonin 3 MG Tabs Take 3 mg by mouth at bedtime.   mirtazapine 7.5 MG tablet Commonly known as:  REMERON Take 7.5 mg by mouth at bedtime.   NUEDEXTA 20-10 MG Caps Generic drug:  Dextromethorphan-Quinidine Take 1 capsule by mouth 2 (two) times daily.   QUEtiapine 50 MG tablet Commonly known as:  SEROQUEL Take 50 mg by mouth 2 (two) times daily.   senna 8.6 MG Tabs tablet Commonly known as:  SENOKOT Take 1 tablet by mouth at bedtime.   tamsulosin 0.4 MG Caps capsule Commonly known as:  FLOMAX Take 0.8 mg by mouth at bedtime.   Vitamin D 2000 units Caps Take 2,000 Units by mouth daily.       Allergies  Allergen Reactions  . Demerol [Meperidine] Other (See Comments)    Reaction:  Unknown   . Lactose Intolerance (Gi) Other (See Comments)    Reaction:  Unknown   . Nickel Other (See Comments)    Reaction:  Unknown   . Oxycodone Other (See Comments)    Reaction:  Unknown     Consultations:  Palliative care team, Dr. Linna DarnerAnwar  Procedures/Studies: Dg Chest Port 1 View  Result Date: 03/19/2016 CLINICAL DATA:  Fever, altered mental status EXAM: PORTABLE CHEST 1 VIEW COMPARISON:  None. FINDINGS: There is right lower lobe hazy airspace disease most concerning for pneumonia. There is no pleural effusion or pneumothorax. The heart and mediastinal contours are unremarkable. The osseous structures are unremarkable. There is a stimulator power pack projecting over the left chest wall with a lead directed cranially. IMPRESSION: Right lower lobe pneumonia. Followup PA and lateral chest X-ray is recommended in 3-4 weeks following trial of antibiotic therapy to ensure resolution and exclude underlying malignancy. Electronically Signed   By: Elige KoHetal  Patel   On: 03/19/2016 10:51   Dg Swallowing Func-speech Pathology  Result Date: 03/20/2016 Objective Swallowing Evaluation: Type of Study: MBS-Modified Barium Swallow Study Patient Details Name: Kristopher Jennings MRN:  [id number] Date of Birth: [birth year] Today's Date: 03/20/2016 Time: SLP Start Time (ACUTE ONLY): 1116-SLP Stop Time (ACUTE ONLY): 1126 SLP Time Calculation (min) (ACUTE ONLY): 10 min Past Medical History: Past Medical History: Diagnosis Date . Anxiety  . Arthritis  . Ataxia  . Cauda equina syndrome (HCC)  . Depression  . Dysphagia  . Essential Tremors  . Paralysis agitans (HCC) 01/12/2013 . Thyroid disease  . UTI (lower urinary tract infection)  Past Surgical History: Past Surgical History: Procedure Laterality Date . LAMINECTOMY   . SPINE SURGERY   HPI: 76 year old man PMH Parkinson's disease, stroke, presented with history fever, shortness of breath and altered mental status; sepsis suspected. CXR right lower lobe pneumonia. Outpatient MBS 10/27/15 revealed penetration and aspiration mostly silent. Chin tuck increased airway protection with nectar and reduced residue in valleculae. Dys 1 texture and nectar thick with chin tuck recommended Subjective: I have been doing chin tuck when I remember Assessment / Plan / Recommendation CHL IP CLINICAL IMPRESSIONS 03/20/2016 Therapy Diagnosis Mild oral phase dysphagia;Moderate pharyngeal phase dysphagia Clinical Impression Pt exhibits a moderate oropharyngeal dysphagia with decreased lingual manipulation/propulsion of boluses, resulting in intermittent left-sided anterior loss, prolonged oral phase and oral residue; Pharyngeal trigger is delayed to  level of valleculae and/or pyriform sinuses with nectar-thick liquids entering the airway before the swallow with larger amounts and penetrated into the laryngeal vestibule with smaller amounts via cup, but reduced significantly and/or eliminated with 1/2 tsp amounts, with penetration and aspiration silent; Mod cues for repetitive swallows and chin tuck used for larger volume (cup/straw) without change; Vallecular/pyriform and pharyngeal wall residue noted, but cleared with 2-3 swallows per bite/sip; honey-thickened liquids  attempted, but more difficult to clear residue with this consistency, despite mod cues from SLP.  Recommend continue Dysphagia 1/nectar-thick liquids with 1/2 tsp amounts, repetitive swallows (2-3 per bite/sip) and effortful swallow as well.  Moderate aspiration risk; Pt would benefit from ST for pharyngeal strengthening and diet tolerance; may consider NPO status if pt continues to decondition and dysphagia worsens secondary to nature of PD. Discussed with pt results and recommendations and he is aware of aspiration risk associated with po intake and possible need for non-oral intake if diet tolerance is not successful. Impact on safety and function Severe aspiration risk;Moderate aspiration risk   CHL IP TREATMENT RECOMMENDATION 03/20/2016 Treatment Recommendations Therapy as outlined in treatment plan below   Prognosis 03/20/2016 Prognosis for Safe Diet Advancement Fair Barriers to Reach Goals Severity of deficits Barriers/Prognosis Comment -- CHL IP DIET RECOMMENDATION 03/20/2016 SLP Diet Recommendations Dysphagia 1 (Puree) solids;Nectar thick liquid;Other (Comment) Liquid Administration via [employer];No straw Medication Administration Crushed with puree Compensations Minimize environmental distractions;Slow rate;Small sips/bites;Multiple dry swallows after each bite/sip;Effortful swallow Postural Changes Seated upright at 90 degrees   CHL IP OTHER RECOMMENDATIONS 03/20/2016 Recommended Consults -- Oral Care Recommendations Oral care BID Other Recommendations Order thickener from pharmacy;Have oral suction available;Remove water pitcher;Prohibited food (jello, ice cream, thin soups)   CHL IP FOLLOW UP RECOMMENDATIONS 03/20/2016 Follow up Recommendations Skilled Nursing facility;24 hour supervision/assistance   CHL IP FREQUENCY AND DURATION 03/20/2016 Speech Therapy Frequency (ACUTE ONLY) min 3x week Treatment Duration 1 week      CHL IP ORAL PHASE 03/20/2016 Oral Phase Impaired Oral - Pudding Teaspoon -- Oral -  Pudding Cup -- Oral - Honey Teaspoon Reduced posterior propulsion;Lingual/palatal residue Oral - Honey Cup -- Oral - Nectar Teaspoon Reduced posterior propulsion;Delayed oral transit;Lingual/palatal residue;Premature spillage Oral - Nectar Cup Reduced posterior propulsion;Premature spillage;Decreased bolus cohesion;Delayed oral transit Oral - Nectar Straw (No Data) Oral - Thin Teaspoon -- Oral - Thin Cup -- Oral - Thin Straw -- Oral - Puree Reduced posterior propulsion;Lingual/palatal residue;Delayed oral transit Oral - Mech Soft -- Oral - Regular -- Oral - Multi-Consistency -- Oral - Pill -- Oral Phase - Comment --  CHL IP PHARYNGEAL PHASE 03/20/2016 Pharyngeal Phase Impaired Pharyngeal- Pudding Teaspoon -- Pharyngeal -- Pharyngeal- Pudding Cup -- Pharyngeal -- Pharyngeal- Honey Teaspoon Delayed swallow initiation-pyriform sinuses;Reduced epiglottic inversion;Reduced anterior laryngeal mobility;Reduced laryngeal elevation;Reduced tongue base retraction;Pharyngeal residue - valleculae;Pharyngeal residue - pyriform;Pharyngeal residue - posterior pharnyx Pharyngeal -- Pharyngeal- Honey Cup -- Pharyngeal -- Pharyngeal- Nectar Teaspoon Delayed swallow initiation-pyriform sinuses;Reduced anterior laryngeal mobility;Reduced laryngeal elevation;Reduced epiglottic inversion;Reduced tongue base retraction;Pharyngeal residue - valleculae;Pharyngeal residue - pyriform Pharyngeal -- Pharyngeal- Nectar Cup Delayed swallow initiation-pyriform sinuses;Reduced epiglottic inversion;Reduced anterior laryngeal mobility;Reduced laryngeal elevation;Reduced tongue base retraction;Penetration/Aspiration before swallow Pharyngeal Material enters airway, remains ABOVE vocal cords and not ejected out;Other (Comment);Material enters airway, passes BELOW cords and not ejected out despite cough attempt by patient Pharyngeal- Nectar Straw -- Pharyngeal -- Pharyngeal- Thin Teaspoon -- Pharyngeal -- Pharyngeal- Thin Cup -- Pharyngeal --  Pharyngeal- Thin Straw -- Pharyngeal -- Pharyngeal- Puree Delayed swallow initiation-vallecula;Reduced anterior laryngeal  mobility;Reduced laryngeal elevation;Reduced tongue base retraction;Pharyngeal residue - valleculae;Pharyngeal residue - pyriform;Pharyngeal residue - posterior pharnyx Pharyngeal -- Pharyngeal- Mechanical Soft -- Pharyngeal -- Pharyngeal- Regular -- Pharyngeal -- Pharyngeal- Multi-consistency -- Pharyngeal -- Pharyngeal- Pill -- Pharyngeal -- Pharyngeal Comment --  CHL IP CERVICAL ESOPHAGEAL PHASE 03/20/2016 Cervical Esophageal Phase WFL Pudding Teaspoon -- Pudding Cup -- Honey Teaspoon -- Honey Cup -- Nectar Teaspoon -- Nectar Cup -- Nectar Straw -- Thin Teaspoon -- Thin Cup -- Thin Straw -- Puree -- Mechanical Soft -- Regular -- Multi-consistency -- Pill -- Cervical Esophageal Comment -- CHL IP GO 03/20/2016 Functional Assessment Tool Used NOMS Functional Limitations Swallowing Swallow Current Status (B1478) CL Swallow Goal Status (G9562) CL Swallow Discharge Status (Z3086) CL Motor Speech Current Status (V7846) (None) Motor Speech Goal Status (N6295) (None) Motor Speech Goal Status (M8413) (None) Spoken Language Comprehension Current Status (K4401) (None) Spoken Language Comprehension Goal Status (U2725) (None) Spoken Language Comprehension Discharge Status (D6644) (None) Spoken Language Expression Current Status (I3474) (None) Spoken Language Expression Goal Status (Q5956) (None) Spoken Language Expression Discharge Status (L8756) (None) Attention Current Status (E3329) (None) Attention Goal Status (J1884) (None) Attention Discharge Status (Z6606) (None) Memory Current Status (T0160) (None) Memory Goal Status (F0932) (None) Memory Discharge Status (T5573) (None) Voice Current Status (U2025) (None) Voice Goal Status (K2706) (None) Voice Discharge Status (C3762) (None) Other Speech-Language Pathology Functional Limitation (G3151) (None) Other Speech-Language Pathology Functional Limitation  Goal Status (V6160) (None) Other Speech-Language Pathology Functional Limitation Discharge Status (V3710) (None) ADAMS,PAT, M.S., CCC-SLP 03/20/2016, 12:06 PM               Subjective: Pt without current complaints. Wants to leave the hospital. Some cough, but has not have a fever.  Discharge Exam: Vitals:   03/22/16 2142 03/23/16 0647  BP: (!) 125/58 (!) 100/51  Pulse: 78 64  Resp: 18 18  Temp: 98.1 F (36.7 C) 98.2 F (36.8 C)   Vitals:   03/22/16 1446 03/22/16 1700 03/22/16 2142 03/23/16 0647  BP: 110/84  (!) 125/58 (!) 100/51  Pulse: 85  78 64  Resp: (!) 22  18 18   Temp: 100.3 F (37.9 C) 99.1 F (37.3 C) 98.1 F (36.7 C) 98.2 F (36.8 C)  TempSrc: Oral  Oral Oral  SpO2: 98%  96% 97%  Weight:      Height:       General: Pt is alert, awake, not in acute distress.  Cardiovascular: RRR, S1/S2 +, no rubs, no gallops Respiratory: Decreased RLL, upper airway secretions. Nonlabored on room air. No wheezing, no rhonchi Abdominal: Soft, NT, ND, bowel sounds + Extremities: no edema, no cyanosis Neuro: Masked facies, slow but appropriate responses with left facial droop.  The results of significant diagnostics from this hospitalization (including imaging, microbiology, ancillary and laboratory) are listed below for reference.    Microbiology: Recent Results (from the past 240 hour(s))  Urine culture     Status: Abnormal   Collection Time: 03/19/16 10:33 AM  Result Value Ref Range Status   Specimen Description URINE, CLEAN CATCH  Final   Special Requests NONE  Final   Culture MULTIPLE SPECIES PRESENT, SUGGEST RECOLLECTION (A)  Final   Report Status 03/20/2016 FINAL  Final  Blood Culture (routine x 2)     Status: None (Preliminary result)   Collection Time: 03/19/16 10:37 AM  Result Value Ref Range Status   Specimen Description BLOOD RIGHT ARM  Final   Special Requests BOTTLES DRAWN AEROBIC AND ANAEROBIC 5CC  Final   Culture  Final    NO GROWTH 3 DAYS Performed at  [facility]    Report Status PENDING  Incomplete  Blood Culture (routine x 2)     Status: None (Preliminary result)   Collection Time: 03/19/16 10:58 AM  Result Value Ref Range Status   Specimen Description BLOOD LEFT FOREARM  Final   Special Requests BOTTLES DRAWN AEROBIC AND ANAEROBIC  Final   Culture   Final    NO GROWTH 3 DAYS Performed at [facility]    Report Status PENDING  Incomplete  MRSA PCR Screening     Status: None   Collection Time: 03/19/16  2:30 PM  Result Value Ref Range Status   MRSA by PCR NEGATIVE NEGATIVE Final    Comment:        The GeneXpert MRSA Assay (FDA approved for NASAL specimens only), is one component of a comprehensive MRSA colonization surveillance program. It is not intended to diagnose MRSA infection nor to guide or monitor treatment for MRSA infections.      Labs: BNP (last 3 results) No results for input(s): BNP in the last 8760 hours. Basic Metabolic Panel:  Recent Labs Lab 03/19/16 1036 03/20/16 0327 03/21/16 0536  NA 147* 149* 149*  K 3.1* 3.7 4.0  CL 115* 119* 120*  CO2 22 23 22   GLUCOSE 145* 175* 142*  BUN 28* 23* 16  CREATININE 1.19 0.91 0.83  CALCIUM 7.5* 7.7* 8.2*   Liver Function Tests:  Recent Labs Lab 03/19/16 1036 03/20/16 0327  AST 43* 41  ALT 73* 62  ALKPHOS 57 58  BILITOT 0.6 0.7  PROT 6.5 6.3*  ALBUMIN 2.7* 2.7*   No results for input(s): LIPASE, AMYLASE in the last 168 hours. No results for input(s): AMMONIA in the last 168 hours. CBC:  Recent Labs Lab 03/19/16 1036 03/20/16 0327 03/21/16 0536  WBC 7.5 7.9 9.6  NEUTROABS 5.1  --   --   HGB 12.5* 11.6* 11.1*  HCT 38.2* 35.1* 33.8*  MCV 92.5 92.6 92.6  PLT 140* 125* 140*   Cardiac Enzymes: No results for input(s): CKTOTAL, CKMB, CKMBINDEX, TROPONINI in the last 168 hours. BNP: Invalid input(s): POCBNP CBG: No results for input(s): GLUCAP in the last 168 hours. D-Dimer No results for input(s): DDIMER in the last  72 hours. Hgb A1c No results for input(s): HGBA1C in the last 72 hours. Lipid Profile No results for input(s): CHOL, HDL, LDLCALC, TRIG, CHOLHDL, LDLDIRECT in the last 72 hours. Thyroid function studies No results for input(s): TSH, T4TOTAL, T3FREE, THYROIDAB in the last 72 hours.  Invalid input(s): FREET3 Anemia work up No results for input(s): VITAMINB12, FOLATE, FERRITIN, TIBC, IRON, RETICCTPCT in the last 72 hours. Urinalysis    Component Value Date/Time   COLORURINE YELLOW 03/19/2016 1033   APPEARANCEUR CLOUDY (A) 03/19/2016 1033   LABSPEC 1.019 03/19/2016 1033   PHURINE 8.0 03/19/2016 1033   GLUCOSEU NEGATIVE 03/19/2016 1033   HGBUR SMALL (A) 03/19/2016 1033   BILIRUBINUR NEGATIVE 03/19/2016 1033   KETONESUR NEGATIVE 03/19/2016 1033   PROTEINUR 100 (A) 03/19/2016 1033   NITRITE NEGATIVE 03/19/2016 1033   LEUKOCYTESUR LARGE (A) 03/19/2016 1033   Sepsis Labs Invalid input(s): PROCALCITONIN,  WBC,  LACTICIDVEN Microbiology Recent Results (from the past 240 hour(s))  Urine culture     Status: Abnormal   Collection Time: 03/19/16 10:33 AM  Result Value Ref Range Status   Specimen Description URINE, CLEAN CATCH  Final   Special Requests NONE  Final  Culture MULTIPLE SPECIES PRESENT, SUGGEST RECOLLECTION (A)  Final   Report Status 03/20/2016 FINAL  Final  Blood Culture (routine x 2)     Status: None (Preliminary result)   Collection Time: 03/19/16 10:37 AM  Result Value Ref Range Status   Specimen Description BLOOD RIGHT ARM  Final   Special Requests BOTTLES DRAWN AEROBIC AND ANAEROBIC 5CC  Final   Culture   Final    NO GROWTH 3 DAYS Performed at [facility]    Report Status PENDING  Incomplete  Blood Culture (routine x 2)     Status: None (Preliminary result)   Collection Time: 03/19/16 10:58 AM  Result Value Ref Range Status   Specimen Description BLOOD LEFT FOREARM  Final   Special Requests BOTTLES DRAWN AEROBIC AND ANAEROBIC  Final   Culture   Final     NO GROWTH 3 DAYS Performed at [facility]    Report Status PENDING  Incomplete  MRSA PCR Screening     Status: None   Collection Time: 03/19/16  2:30 PM  Result Value Ref Range Status   MRSA by PCR NEGATIVE NEGATIVE Final    Comment:        The GeneXpert MRSA Assay (FDA approved for NASAL specimens only), is one component of a comprehensive MRSA colonization surveillance program. It is not intended to diagnose MRSA infection nor to guide or monitor treatment for MRSA infections.     Time coordinating discharge: Over 30 minutes  Hazeline Junker, MD  [facility] 03/23/2016, 11:47 AM Pager [phone]  If 7PM-7AM, please contact night-coverage [email or web address] Password [email or web address]

## 2016-03-23 NOTE — Progress Notes (Signed)
Plan for d/c to SNF, discharge planning per CSW. [phone] 

## 2016-03-23 NOTE — Progress Notes (Signed)
LCSWA spoke with patient Kristopher Jennings over phone to provide bed offers. Patient son reports he [facility]. He reports after talking with patient last night, he feels the patient wants to return.  LCSWA confirmed and formed placement/palltative with admission coordinator [place] [facility]. Patient can return at anytime.  Updated Clinical information faxed to facility.   Vivi BarrackNicole Guilford Shannahan, Theresia MajorsLCSWA, MSW Clinical Social Worker 5E and Psychiatric Service Line [phone] 03/23/2016  10:21 AM

## 2016-03-23 NOTE — Progress Notes (Signed)
[facility] being [facility]. Report called to Vernona RiegerLaura, RN. Discharge packet is at nursing station and will be given to transport when arrive. Currently awaiting [facility] transport.  Fabian Walder W Stanton Kissoon, RN

## 2016-03-23 NOTE — Progress Notes (Signed)
[facility] called for transport with O2. Nurse given report number. No other needs identified.   Kristopher BarrackNicole Esther Jennings, Kristopher MajorsLCSWA, MSW Clinical Social Worker 5E and Psychiatric Service Line [phone] 03/23/2016  2:18 PM

## 2016-03-23 NOTE — Clinical Social Work Placement (Signed)
   CLINICAL SOCIAL WORK PLACEMENT  NOTE  Date:  03/23/2016  Patient Details  Name: Kristopher Jennings MRN: [id number] Date of Birth: 1940/04/07  Clinical Social Work is seeking post-discharge placement for this patient at the Skilled  Nursing Facility level of care (*CSW will initial, date and re-position this form in  chart as items are completed):  Yes   Patient/family provided with [facility] Clinical Social Work Department's list of facilities offering this level of care within the geographic area requested by the patient (or if unable, by the patient's family).  Yes   Patient/family informed of their freedom to choose among providers that offer the needed level of care, that participate in Medicare, Medicaid or managed care program needed by the patient, have an available bed and are willing to accept the patient.  Yes   Patient/family informed of [facility]'s ownership interest in [facility] and [facility], as well as of the fact that they are under no obligation to receive care at these facilities.  PASRR submitted to EDS on       PASRR number received on       Existing PASRR number confirmed on 03/23/16     FL2 transmitted to all facilities in geographic area requested by pt/family on       FL2 transmitted to all facilities within larger geographic area on 03/22/16     Patient informed that his/her managed care company has contracts with or will negotiate with certain facilities, including the following:  [facility]         Patient/family informed of bed offers received.  [facility]     Physician [facility] and [facility]    [facility]  Patient to be transferred to facility by [facility]     Patient family notified on 03/23/16 of transfer.  Name of family member notified:  Son-Rus     PHYSICIAN Please sign FL2, Please prepare priority discharge summary, including  medications, Please sign DNR     Additional Comment:    _______________________________________________ Clearance CootsNicole A Nakeshia Waldeck, LCSW 03/23/2016, 10:26 AM

## 2016-03-24 LAB — CULTURE, BLOOD (ROUTINE X 2)
Culture: NO GROWTH
Culture: NO GROWTH

## 2016-07-20 ENCOUNTER — Emergency Department (HOSPITAL_COMMUNITY): Payer: Medicare Other

## 2016-07-20 ENCOUNTER — Encounter (HOSPITAL_COMMUNITY): Payer: Self-pay | Admitting: Emergency Medicine

## 2016-07-20 ENCOUNTER — Inpatient Hospital Stay (HOSPITAL_COMMUNITY)
Admission: EM | Admit: 2016-07-20 | Discharge: 2016-07-24 | DRG: 871 | Disposition: A | Discharge status: Skilled Nursing Facility | Payer: Medicare Other | Attending: Internal Medicine | Admitting: Internal Medicine

## 2016-07-20 DIAGNOSIS — A419 Sepsis, unspecified organism: Secondary | ICD-10-CM | POA: Diagnosis not present

## 2016-07-20 DIAGNOSIS — Z66 Do not resuscitate: Secondary | ICD-10-CM | POA: Diagnosis present

## 2016-07-20 DIAGNOSIS — F419 Anxiety disorder, unspecified: Secondary | ICD-10-CM | POA: Diagnosis present

## 2016-07-20 DIAGNOSIS — I10 Essential (primary) hypertension: Secondary | ICD-10-CM | POA: Diagnosis present

## 2016-07-20 DIAGNOSIS — E876 Hypokalemia: Secondary | ICD-10-CM | POA: Diagnosis present

## 2016-07-20 DIAGNOSIS — Z885 Allergy status to narcotic agent status: Secondary | ICD-10-CM

## 2016-07-20 DIAGNOSIS — G20 Parkinson's disease: Secondary | ICD-10-CM | POA: Diagnosis present

## 2016-07-20 DIAGNOSIS — J189 Pneumonia, unspecified organism: Secondary | ICD-10-CM | POA: Diagnosis present

## 2016-07-20 DIAGNOSIS — F329 Major depressive disorder, single episode, unspecified: Secondary | ICD-10-CM | POA: Diagnosis present

## 2016-07-20 DIAGNOSIS — R8271 Bacteriuria: Secondary | ICD-10-CM | POA: Diagnosis present

## 2016-07-20 DIAGNOSIS — Z91048 Other nonmedicinal substance allergy status: Secondary | ICD-10-CM

## 2016-07-20 DIAGNOSIS — R1310 Dysphagia, unspecified: Secondary | ICD-10-CM | POA: Diagnosis present

## 2016-07-20 DIAGNOSIS — Z681 Body mass index (BMI) 19 or less, adult: Secondary | ICD-10-CM

## 2016-07-20 DIAGNOSIS — K219 Gastro-esophageal reflux disease without esophagitis: Secondary | ICD-10-CM | POA: Diagnosis present

## 2016-07-20 DIAGNOSIS — G250 Essential tremor: Secondary | ICD-10-CM | POA: Diagnosis present

## 2016-07-20 DIAGNOSIS — Y95 Nosocomial condition: Secondary | ICD-10-CM | POA: Diagnosis present

## 2016-07-20 DIAGNOSIS — R0902 Hypoxemia: Secondary | ICD-10-CM | POA: Diagnosis present

## 2016-07-20 DIAGNOSIS — J690 Pneumonitis due to inhalation of food and vomit: Secondary | ICD-10-CM | POA: Diagnosis present

## 2016-07-20 DIAGNOSIS — Z91011 Allergy to milk products: Secondary | ICD-10-CM

## 2016-07-20 DIAGNOSIS — E039 Hypothyroidism, unspecified: Secondary | ICD-10-CM | POA: Diagnosis present

## 2016-07-20 DIAGNOSIS — Z888 Allergy status to other drugs, medicaments and biological substances status: Secondary | ICD-10-CM

## 2016-07-20 DIAGNOSIS — F0390 Unspecified dementia without behavioral disturbance: Secondary | ICD-10-CM | POA: Diagnosis present

## 2016-07-20 DIAGNOSIS — R64 Cachexia: Secondary | ICD-10-CM | POA: Diagnosis present

## 2016-07-20 DIAGNOSIS — Z7983 Long term (current) use of bisphosphonates: Secondary | ICD-10-CM

## 2016-07-20 DIAGNOSIS — Z87891 Personal history of nicotine dependence: Secondary | ICD-10-CM

## 2016-07-20 DIAGNOSIS — E43 Unspecified severe protein-calorie malnutrition: Secondary | ICD-10-CM | POA: Diagnosis present

## 2016-07-20 DIAGNOSIS — Z961 Presence of intraocular lens: Secondary | ICD-10-CM | POA: Diagnosis present

## 2016-07-20 DIAGNOSIS — I69391 Dysphagia following cerebral infarction: Secondary | ICD-10-CM

## 2016-07-20 DIAGNOSIS — Z79899 Other long term (current) drug therapy: Secondary | ICD-10-CM

## 2016-07-20 HISTORY — DX: Parkinson's disease: G20

## 2016-07-20 HISTORY — DX: Parkinson's disease without dyskinesia, without mention of fluctuations: G20.A1

## 2016-07-20 HISTORY — DX: Cerebral infarction, unspecified: I63.9

## 2016-07-20 HISTORY — DX: Gastro-esophageal reflux disease without esophagitis: K21.9

## 2016-07-20 LAB — CBC WITH DIFFERENTIAL/PLATELET
BASOS ABS: 0 10*3/uL (ref 0.0–0.1)
BASOS PCT: 0 %
EOS ABS: 0 10*3/uL (ref 0.0–0.7)
Eosinophils Relative: 0 %
HEMATOCRIT: 44.2 % (ref 39.0–52.0)
HEMOGLOBIN: 15.2 g/dL (ref 13.0–17.0)
Lymphocytes Relative: 8 %
Lymphs Abs: 1.7 10*3/uL (ref 0.7–4.0)
MCH: 30.8 pg (ref 26.0–34.0)
MCHC: 34.4 g/dL (ref 30.0–36.0)
MCV: 89.7 fL (ref 78.0–100.0)
MONOS PCT: 8 %
Monocytes Absolute: 1.8 10*3/uL — ABNORMAL HIGH (ref 0.1–1.0)
NEUTROS ABS: 17.9 10*3/uL — AB (ref 1.7–7.7)
NEUTROS PCT: 84 %
Platelets: 181 10*3/uL (ref 150–400)
RBC: 4.93 MIL/uL (ref 4.22–5.81)
RDW: 12.9 % (ref 11.5–15.5)
WBC: 21.4 10*3/uL — AB (ref 4.0–10.5)

## 2016-07-20 LAB — I-STAT CG4 LACTIC ACID, ED: LACTIC ACID, VENOUS: 1.5 mmol/L (ref 0.5–1.9)

## 2016-07-20 MED ORDER — ALBUTEROL SULFATE (2.5 MG/3ML) 0.083% IN NEBU
5.0000 mg | INHALATION_SOLUTION | Freq: Once | RESPIRATORY_TRACT | Status: AC
  Administered 2016-07-21: 5 mg via RESPIRATORY_TRACT
  Filled 2016-07-20: qty 6

## 2016-07-20 NOTE — ED Provider Notes (Signed)
By signing my name below, I, Talbert Nan, attest that this documentation has been prepared under the direction and in the presence of Paula Libra, MD. Electronically Signed: Talbert Nan, Scribe. 07/20/16. 11:36 PM.  [facility]-EMERGENCY DEPT Provider Note: Lowella Dell, MD, FACEP  CSN: [id number] MRN: [id number] ARRIVAL: 07/20/16 at 2207 ROOM: WA18/WA18   CHIEF COMPLAINT  Shortness of Breath  LEVEL 5 CAVEAT: HPI and ROS limited due to dementia.  HISTORY OF PRESENT ILLNESS  Kristopher Jennings is a 76 y.o. male with h/o dementia, GERD, parkinson disease, CVA, brought in by ambulance, who presents to the Emergency Department fever TMAX 100.4 in ED that began this afternoon. Pt has associated shortness of breath. According to triage note, pt started running a fever this afternoon and they had a chest xray done at their facility that showed pneumonia. Pt was given IVF and IM rocephin at the facility PTA.     Past Medical History:  Diagnosis Date  . Anxiety   . Arthritis   . Ataxia   . Cauda equina syndrome (HCC)   . Depression   . Dysphagia   . Essential Tremors   . GERD (gastroesophageal reflux disease)   . Paralysis agitans (HCC) 01/12/2013  . Parkinson disease (HCC)   . Stroke (HCC)   . Thyroid disease   . UTI (lower urinary tract infection)     Past Surgical History:  Procedure Laterality Date  . LAMINECTOMY    . SPINE SURGERY      No family history on file.  Social History  Substance Use Topics  . Smoking status: Former Games developer  . Smokeless tobacco: Never Used  . Alcohol use No    Prior to Admission medications   Medication Sig Start Date End Date Taking? Authorizing Provider  acetaminophen (TYLENOL) 325 MG tablet Take 650 mg by mouth every 4 (four) hours as needed for mild pain, moderate pain, fever or headache.    Yes Historical Provider, MD  Amino Acids-Protein Hydrolys (FEEDING SUPPLEMENT, PRO-STAT SUGAR FREE 64,) LIQD Take 30 mLs by mouth daily.   Yes Historical  Provider, MD  buPROPion (WELLBUTRIN SR) 200 MG 12 hr tablet Take 200 mg by mouth daily before breakfast.    Yes Historical Provider, MD  Cholecalciferol (VITAMIN D) 2000 units CAPS Take 2,000 Units by mouth daily.    Yes Historical Provider, MD  clonazePAM (KLONOPIN) 0.5 MG tablet Take 0.5 mg by mouth daily.   Yes Historical Provider, MD  Dextromethorphan-Quinidine (NUEDEXTA) 20-10 MG CAPS Take 1 capsule by mouth 2 (two) times daily.   Yes Historical Provider, MD  levothyroxine (SYNTHROID, LEVOTHROID) 175 MCG tablet Take 175 mcg by mouth daily before breakfast.   Yes Historical Provider, MD  Melatonin 3 MG TABS Take 3 mg by mouth at bedtime.   Yes Historical Provider, MD  QUEtiapine (SEROQUEL) 50 MG tablet Take 50 mg by mouth 2 (two) times daily.   Yes Historical Provider, MD  senna (SENOKOT) 8.6 MG TABS tablet Take 1 tablet by mouth at bedtime.   Yes Historical Provider, MD  tamsulosin (FLOMAX) 0.4 MG CAPS capsule Take 0.8 mg by mouth at bedtime.    Yes Historical Provider, MD    Allergies Demerol [meperidine]; Lactose intolerance (gi); Nickel; and Oxycodone    REVIEW OF SYSTEMS     PHYSICAL EXAMINATION  Initial Vital Signs Blood pressure 104/70, pulse 91, temperature (!) 100.4 F (38 C), temperature source Rectal, resp. rate 20, SpO2 96 %.  Examination General: Well-developed, well-nourished male in  no acute distress; appearance consistent with age of record HENT: normocephalic; atraumatic Eyes: pupils equal, round and reactive to light; lens implants Neck: supple Heart: regular rate and rhythm Lungs: clear to auscultation bilaterally Abdomen: soft; nondistended; nontender; no masses or hepatosplenomegaly; bowel sounds present Extremities: No acute deformity; range of motion limited by rigidity pulses normal Neurologic: Awake, alert, nonverbal; rigidity of extremities with fine resting tremor of hands; masklike facies  Skin: Warm and dry   RESULTS  Summary of this visit's  results, reviewed by myself:   EKG Interpretation  Date/Time:  Tuesday Jul 20 2016 23:43:25 EDT Ventricular Rate:  94 PR Interval:    QRS Duration: 77 QT Interval:  316 QTC Calculation: 396 R Axis:   -94 Text Interpretation:  Normal sinus rhythm Interpretation limited secondary to artifact Confirmed by Hamilton Marinello  MD, Jonny Ruiz ([id number]) on 07/20/2016 11:59:45 PM      Laboratory Studies: Results for orders placed or performed during the hospital encounter of 07/20/16 (from the past 24 hour(s))  CBC with Differential/Platelet     Status: Abnormal   Collection Time: 07/20/16 11:34 PM  Result Value Ref Range   WBC 21.4 (H) 4.0 - 10.5 K/uL   RBC 4.93 4.22 - 5.81 MIL/uL   Hemoglobin 15.2 13.0 - 17.0 g/dL   HCT 60.4 54.0 - 98.1 %   MCV 89.7 78.0 - 100.0 fL   MCH 30.8 26.0 - 34.0 pg   MCHC 34.4 30.0 - 36.0 g/dL   RDW 19.1 47.8 - 29.5 %   Platelets 181 150 - 400 K/uL   Neutrophils Relative % 84 %   Neutro Abs 17.9 (H) 1.7 - 7.7 K/uL   Lymphocytes Relative 8 %   Lymphs Abs 1.7 0.7 - 4.0 K/uL   Monocytes Relative 8 %   Monocytes Absolute 1.8 (H) 0.1 - 1.0 K/uL   Eosinophils Relative 0 %   Eosinophils Absolute 0.0 0.0 - 0.7 K/uL   Basophils Relative 0 %   Basophils Absolute 0.0 0.0 - 0.1 K/uL  Basic metabolic panel     Status: Abnormal   Collection Time: 07/20/16 11:34 PM  Result Value Ref Range   Sodium 140 135 - 145 mmol/L   Potassium 4.1 3.5 - 5.1 mmol/L   Chloride 105 101 - 111 mmol/L   CO2 24 22 - 32 mmol/L   Glucose, Bld 135 (H) 65 - 99 mg/dL   BUN 24 (H) 6 - 20 mg/dL   Creatinine, Ser 6.21 0.61 - 1.24 mg/dL   Calcium 8.9 8.9 - 30.8 mg/dL   GFR calc non Af Amer 58 (L) >60 mL/min   GFR calc Af Amer >60 >60 mL/min   Anion gap 11 5 - 15  I-Stat CG4 Lactic Acid, ED     Status: None   Collection Time: 07/20/16 11:44 PM  Result Value Ref Range   Lactic Acid, Venous 1.50 0.5 - 1.9 mmol/L   Imaging Studies: Dg Chest 2 View  Result Date: 07/21/2016 CLINICAL DATA:  Fever this and  pain. EXAM: CHEST  2 VIEW COMPARISON:  03/19/2016 FINDINGS: Patchy right base opacity may represent pneumonia. No effusion. Left lung is clear. Normal heart size. IMPRESSION: Right base patchy airspace opacity, possibly pneumonia. Followup PA and lateral chest X-ray is recommended in 3-4 weeks following trial of antibiotic therapy to ensure resolution and exclude underlying malignancy. Electronically Signed   By: Ellery Plunk M.D.   On: 07/21/2016 00:03    ED COURSE  Nursing notes and initial vitals signs,  including pulse oximetry, reviewed.  Vitals:   07/20/16 2259 07/20/16 2306 07/20/16 2330 07/21/16 0002  BP: 104/70  105/78   Pulse: 91  92   Resp: 20  (!) 25   Temp: 98 F (36.7 C) (!) 100.4 F (38 C)    TempSrc: Oral Rectal    SpO2: 96%  95% 96%  Weight:    128 lb 15.5 oz (58.5 kg)   12:21 AM Cefepime and vancomycin ordered for healthcare associated pneumonia.  PROCEDURES    ED DIAGNOSES     ICD-9-CM ICD-10-CM   1. HCAP (healthcare-associated pneumonia) 486 J18.9     I personally performed the services described in this documentation, which was scribed in my presence. The recorded information has been reviewed and is accurate.    Paula Libra, MD 07/21/16 [phone]

## 2016-07-20 NOTE — ED Triage Notes (Signed)
Pt brought in by EMS from [facility] for fever  Pt started running a fever this afternoon and they had a chest xray done at their facility that showed aspiration pneumonia  Pt was given IVF and IM rocephin at the facility

## 2016-07-21 ENCOUNTER — Inpatient Hospital Stay (HOSPITAL_COMMUNITY): Payer: Medicare Other

## 2016-07-21 DIAGNOSIS — G20 Parkinson's disease: Secondary | ICD-10-CM | POA: Diagnosis present

## 2016-07-21 DIAGNOSIS — R1310 Dysphagia, unspecified: Secondary | ICD-10-CM | POA: Diagnosis present

## 2016-07-21 DIAGNOSIS — G250 Essential tremor: Secondary | ICD-10-CM | POA: Diagnosis not present

## 2016-07-21 DIAGNOSIS — F0390 Unspecified dementia without behavioral disturbance: Secondary | ICD-10-CM | POA: Diagnosis present

## 2016-07-21 DIAGNOSIS — J690 Pneumonitis due to inhalation of food and vomit: Secondary | ICD-10-CM | POA: Diagnosis present

## 2016-07-21 DIAGNOSIS — E43 Unspecified severe protein-calorie malnutrition: Secondary | ICD-10-CM | POA: Diagnosis present

## 2016-07-21 DIAGNOSIS — I10 Essential (primary) hypertension: Secondary | ICD-10-CM | POA: Diagnosis present

## 2016-07-21 DIAGNOSIS — Z87891 Personal history of nicotine dependence: Secondary | ICD-10-CM | POA: Diagnosis not present

## 2016-07-21 DIAGNOSIS — Z79899 Other long term (current) drug therapy: Secondary | ICD-10-CM | POA: Diagnosis not present

## 2016-07-21 DIAGNOSIS — Y95 Nosocomial condition: Secondary | ICD-10-CM | POA: Diagnosis present

## 2016-07-21 DIAGNOSIS — A419 Sepsis, unspecified organism: Secondary | ICD-10-CM | POA: Diagnosis not present

## 2016-07-21 DIAGNOSIS — J189 Pneumonia, unspecified organism: Secondary | ICD-10-CM | POA: Diagnosis not present

## 2016-07-21 DIAGNOSIS — E039 Hypothyroidism, unspecified: Secondary | ICD-10-CM

## 2016-07-21 DIAGNOSIS — Z7983 Long term (current) use of bisphosphonates: Secondary | ICD-10-CM | POA: Diagnosis not present

## 2016-07-21 DIAGNOSIS — E876 Hypokalemia: Secondary | ICD-10-CM | POA: Diagnosis present

## 2016-07-21 DIAGNOSIS — R64 Cachexia: Secondary | ICD-10-CM | POA: Diagnosis present

## 2016-07-21 DIAGNOSIS — Z66 Do not resuscitate: Secondary | ICD-10-CM | POA: Diagnosis present

## 2016-07-21 DIAGNOSIS — F419 Anxiety disorder, unspecified: Secondary | ICD-10-CM | POA: Diagnosis present

## 2016-07-21 DIAGNOSIS — K219 Gastro-esophageal reflux disease without esophagitis: Secondary | ICD-10-CM | POA: Diagnosis present

## 2016-07-21 DIAGNOSIS — I69391 Dysphagia following cerebral infarction: Secondary | ICD-10-CM | POA: Diagnosis not present

## 2016-07-21 DIAGNOSIS — R8271 Bacteriuria: Secondary | ICD-10-CM | POA: Diagnosis present

## 2016-07-21 DIAGNOSIS — F329 Major depressive disorder, single episode, unspecified: Secondary | ICD-10-CM | POA: Diagnosis present

## 2016-07-21 DIAGNOSIS — Z91011 Allergy to milk products: Secondary | ICD-10-CM | POA: Diagnosis not present

## 2016-07-21 DIAGNOSIS — Z681 Body mass index (BMI) 19 or less, adult: Secondary | ICD-10-CM | POA: Diagnosis not present

## 2016-07-21 DIAGNOSIS — R0902 Hypoxemia: Secondary | ICD-10-CM | POA: Diagnosis present

## 2016-07-21 LAB — URINALYSIS, ROUTINE W REFLEX MICROSCOPIC
Bacteria, UA: NONE SEEN
Bilirubin Urine: NEGATIVE
Glucose, UA: NEGATIVE mg/dL
KETONES UR: NEGATIVE mg/dL
Nitrite: NEGATIVE
PROTEIN: 100 mg/dL — AB
Specific Gravity, Urine: 1.02 (ref 1.005–1.030)
Squamous Epithelial / LPF: NONE SEEN
pH: 7 (ref 5.0–8.0)

## 2016-07-21 LAB — CBC
HEMATOCRIT: 44.2 % (ref 39.0–52.0)
HEMOGLOBIN: 15.2 g/dL (ref 13.0–17.0)
MCH: 30.8 pg (ref 26.0–34.0)
MCHC: 34.4 g/dL (ref 30.0–36.0)
MCV: 89.7 fL (ref 78.0–100.0)
Platelets: 165 10*3/uL (ref 150–400)
RBC: 4.93 MIL/uL (ref 4.22–5.81)
RDW: 13.2 % (ref 11.5–15.5)
WBC: 20.5 10*3/uL — AB (ref 4.0–10.5)

## 2016-07-21 LAB — BASIC METABOLIC PANEL
ANION GAP: 11 (ref 5–15)
Anion gap: 11 (ref 5–15)
BUN: 26 mg/dL — ABNORMAL HIGH (ref 6–20)
BUN: 24 mg/dL — AB (ref 6–20)
CHLORIDE: 106 mmol/L (ref 101–111)
CO2: 23 mmol/L (ref 22–32)
CO2: 24 mmol/L (ref 22–32)
Calcium: 8.8 mg/dL — ABNORMAL LOW (ref 8.9–10.3)
Calcium: 8.9 mg/dL (ref 8.9–10.3)
Chloride: 105 mmol/L (ref 101–111)
Creatinine, Ser: 1.12 mg/dL (ref 0.61–1.24)
Creatinine, Ser: 1.2 mg/dL (ref 0.61–1.24)
GFR calc Af Amer: >60 mL/min (ref >60)
GFR calc non Af Amer: 58 mL/min — ABNORMAL LOW (ref >60)
GFR calc non Af Amer: >60 mL/min (ref >60)
Glucose, Bld: 135 mg/dL — ABNORMAL HIGH (ref 65–99)
Glucose, Bld: 169 mg/dL — ABNORMAL HIGH (ref 65–99)
POTASSIUM: 3.9 mmol/L (ref 3.5–5.1)
POTASSIUM: 4.1 mmol/L (ref 3.5–5.1)
SODIUM: 140 mmol/L (ref 135–145)
Sodium: 140 mmol/L (ref 135–145)

## 2016-07-21 LAB — HEPATIC FUNCTION PANEL
ALBUMIN: 3.1 g/dL — AB (ref 3.5–5.0)
ALK PHOS: 82 U/L (ref 38–126)
ALT: 10 U/L — AB (ref 17–63)
AST: 16 U/L (ref 15–41)
BILIRUBIN TOTAL: 0.7 mg/dL (ref 0.3–1.2)
Bilirubin, Direct: 0.2 mg/dL (ref 0.1–0.5)
Indirect Bilirubin: 0.5 mg/dL (ref 0.3–0.9)
Total Protein: 7.4 g/dL (ref 6.5–8.1)

## 2016-07-21 LAB — MRSA PCR SCREENING: MRSA BY PCR: NEGATIVE

## 2016-07-21 MED ORDER — VANCOMYCIN HCL 500 MG IV SOLR
500.0000 mg | Freq: Two times a day (BID) | INTRAVENOUS | Status: DC
  Administered 2016-07-21 (×2): 500 mg via INTRAVENOUS
  Filled 2016-07-21 (×3): qty 500

## 2016-07-21 MED ORDER — DEXTROSE 5 % IV SOLN
1.0000 g | Freq: Two times a day (BID) | INTRAVENOUS | Status: DC
  Administered 2016-07-21 – 2016-07-22 (×3): 1 g via INTRAVENOUS
  Filled 2016-07-21 (×4): qty 1

## 2016-07-21 MED ORDER — CHLORHEXIDINE GLUCONATE 0.12 % MT SOLN
15.0000 mL | Freq: Two times a day (BID) | OROMUCOSAL | Status: DC
Start: 2016-07-21 — End: 2016-07-24
  Administered 2016-07-21 – 2016-07-24 (×6): 15 mL via OROMUCOSAL
  Filled 2016-07-21 (×5): qty 15

## 2016-07-21 MED ORDER — ORAL CARE MOUTH RINSE
15.0000 mL | Freq: Two times a day (BID) | OROMUCOSAL | Status: DC
  Administered 2016-07-21 – 2016-07-23 (×6): 15 mL via OROMUCOSAL

## 2016-07-21 MED ORDER — METRONIDAZOLE IN NACL 5-0.79 MG/ML-% IV SOLN
500.0000 mg | Freq: Three times a day (TID) | INTRAVENOUS | Status: DC
  Administered 2016-07-21 – 2016-07-22 (×4): 500 mg via INTRAVENOUS
  Filled 2016-07-21 (×4): qty 100

## 2016-07-21 MED ORDER — ALBUTEROL SULFATE (2.5 MG/3ML) 0.083% IN NEBU: 2.5000 mg | INHALATION_SOLUTION | RESPIRATORY_TRACT | Status: DC | PRN

## 2016-07-21 MED ORDER — VANCOMYCIN HCL IN DEXTROSE 750-5 MG/150ML-% IV SOLN
750.0000 mg | INTRAVENOUS | Status: AC
  Administered 2016-07-21: 750 mg via INTRAVENOUS
  Filled 2016-07-21: qty 150

## 2016-07-21 MED ORDER — SODIUM CHLORIDE 0.9 % IV BOLUS (SEPSIS)
500.0000 mL | Freq: Once | INTRAVENOUS | Status: AC
  Administered 2016-07-21: 500 mL via INTRAVENOUS

## 2016-07-21 MED ORDER — DEXTROSE 5 % IV SOLN
1.0000 g | Freq: Once | INTRAVENOUS | Status: AC
  Administered 2016-07-21: 1 g via INTRAVENOUS
  Filled 2016-07-21: qty 1

## 2016-07-21 MED ORDER — LEVOTHYROXINE SODIUM 100 MCG IV SOLR
85.0000 ug | Freq: Every day | INTRAVENOUS | Status: DC
  Administered 2016-07-21 – 2016-07-23 (×3): 85 ug via INTRAVENOUS
  Filled 2016-07-21 (×3): qty 5

## 2016-07-21 MED ORDER — MORPHINE SULFATE (PF) 4 MG/ML IV SOLN
1.0000 mg | INTRAVENOUS | Status: DC | PRN
  Administered 2016-07-23: 1 mg via INTRAVENOUS
  Filled 2016-07-21: qty 1

## 2016-07-21 MED ORDER — ENOXAPARIN SODIUM 40 MG/0.4ML ~~LOC~~ SOLN
40.0000 mg | SUBCUTANEOUS | Status: DC
  Administered 2016-07-21 – 2016-07-24 (×4): 40 mg via SUBCUTANEOUS
  Filled 2016-07-21 (×4): qty 0.4

## 2016-07-21 MED ORDER — SODIUM CHLORIDE 0.9 % IV SOLN
INTRAVENOUS | Status: DC
  Administered 2016-07-21 – 2016-07-24 (×6): via INTRAVENOUS

## 2016-07-21 MED ORDER — ACETAMINOPHEN 650 MG RE SUPP
650.0000 mg | RECTAL | Status: DC | PRN
  Administered 2016-07-21 – 2016-07-23 (×2): 650 mg via RECTAL
  Filled 2016-07-21 (×2): qty 1

## 2016-07-21 NOTE — Progress Notes (Signed)
PROGRESS NOTE    Kristopher Jennings  ZOX:[id number] DOB: 1940-04-15 DOA: 07/20/2016 PCP: Katy Apo, MD   Chief Complaint  Patient presents with  . Shortness of Breath    Brief Narrative:  HPI on 07/21/2016 by Dr. Michael Litter Kristopher Jennings is a 76 y.o. gentleman with a history of Parkinson's disease with cognitive impairment, prior CVA, essential tremor, dysphagia, and previous admission in January 2018 for sepsis secondary to aspiration pneumonia. Reportedly, he developed fever today and an outpatient chest xray was obtained, which showed an infiltrate.  The patient received IM Rocephin before being transferred to the ED for further evaluation and treatment. Upon arrival in the ED, he had mild hypoxia, corrected with 2L [place].  He has a harsh wet cough.  The patient says that he coughs every night.  He denies chest pain.  He has had nausea; he cannot tell me if he has vomited.  It is not clear if an overt aspiration event was noted at his facility.n  He cannot tell me if he has had syncope or recent fall.  Assessment & Plan   Sepsis secondary to HCAP versus aspiration pneumonia -Upon admission, patient was febrile with leukocytosis, tachypnea, tachycardia -Chest x-ray showed right base patchy airspace opacity recommended follow-up PA and lateral chest x-ray in 3-4 weeks -Blood cultures and sputum culture pending -Currently NPO, pending speech evaulation -Patient has had a history of dysphagia -Continue aspiration precautions -Continue nebulizers, oral care -Continue vancomycin, cefepime, flagyl -Will add on flutter valve  Hypothyroidism -Continue IV Synthroid  Dysphagia  -Secondary to history of CVA/Deep brain stimulator  Essential Tremors/Parkinson's  -Has had deep brain stimulator placed -Spoke with son, patient's tremors are not related to Parkinson's -Nuedexta held   Depression -Wellbutrin/seroquel held  Asymptomatic bacteriuria  -patient does self cath -UA: No  bacteria, TNTC WBC, large leukocytes   Malnutrition -Likely secondary to dysphagia -Will consult nutrition  Goals of care -Had discussion with son via phone. Patient has been depressed lately and has been dealing with dysphagia for some time after his CVA after deep brain stimulator. Son feels patient is giving up. Would like palliative care consulted and to discuss options of hospice. -Currently DNR (confirmed with son) -?MOST form -Palliative care consulted and appreciated  DVT Prophylaxis  Lovenox  Code Status: DNR  Family Communication: None at bedside. Son via phone  Disposition Plan: Admitted. Pending speech evaluation and palliative care consult. SNF when stable/improved  Consultants Palliative care  Procedures  None   Antibiotics   Anti-infectives    Start     Dose/Rate Route Frequency Ordered Stop   07/21/16 1200  vancomycin (VANCOCIN) 500 mg in sodium chloride 0.9 % 100 mL IVPB     500 mg 100 mL/hr over 60 Minutes Intravenous Every 12 hours 07/21/16 0139     07/21/16 1000  ceFEPIme (MAXIPIME) 1 g in dextrose 5 % 50 mL IVPB     1 g 100 mL/hr over 30 Minutes Intravenous Every 12 hours 07/21/16 0152 07/29/16 0959   07/21/16 0415  metroNIDAZOLE (FLAGYL) IVPB 500 mg     500 mg 100 mL/hr over 60 Minutes Intravenous Every 8 hours 07/21/16 0404     07/21/16 0100  vancomycin (VANCOCIN) IVPB 750 mg/150 ml premix     750 mg 150 mL/hr over 60 Minutes Intravenous STAT 07/21/16 0030 07/21/16 0225   07/21/16 0030  ceFEPIme (MAXIPIME) 1 g in dextrose 5 % 50 mL IVPB     1 g 100 mL/hr over 30  Minutes Intravenous  Once 07/21/16 0021 07/21/16 0118      Subjective:   Kristopher Jennings seen and examined today.  Continues to have cough and congestion. Feels he is unable to get up mucous. Denies chest pain, abdominal pain, nausea, vomiting, dizziness, headache. Endorses poor appetite and not eating for 4 days. Does feel thirsty.     Objective:   Vitals:   07/21/16 0500  07/21/16 0600 07/21/16 0800 07/21/16 0900  BP: 98/65 (!) 115/59 (!) 144/97 124/75  Pulse: 82 83 89 89  Resp: (!) 22 20 (!) 26 20  Temp:      TempSrc:      SpO2: 96% 96% 96% 95%  Weight:      Height:        Intake/Output Summary (Last 24 hours) at 07/21/16 1136 Last data filed at 07/21/16 0900  Gross per 24 hour  Intake             1380 ml  Output                0 ml  Net             1380 ml   Filed Weights   07/21/16 0002 07/21/16 0230  Weight: 58.5 kg (128 lb 15.5 oz) 52.2 kg (115 lb 1.3 oz)    Exam  General: Well developed, Cachectic elderly male, NAD  HEENT: NCAT, mucous membranes dry  Cardiovascular: S1 S2 auscultated, no rubs, murmurs or gallops. Regular rate and rhythm.  Respiratory: Coarse breath sounds  Abdomen: Soft, nontender, nondistended, + bowel sounds  Extremities: warm dry without cyanosis clubbing or edema  Neuro: AAOx3, nonfocal  Psych: Depressed mood, flat affect   Data Reviewed: I have personally reviewed following labs and imaging studies  CBC:  Recent Labs Lab 07/20/16 2334 07/21/16 0255  WBC 21.4* 20.5*  NEUTROABS 17.9*  --   HGB 15.2 15.2  HCT 44.2 44.2  MCV 89.7 89.7  PLT 181 165   Basic Metabolic Panel:  Recent Labs Lab 07/20/16 2334 07/21/16 0255  NA 140 140  K 4.1 3.9  CL 105 106  CO2 24 23  GLUCOSE 135* 169*  BUN 24* 26*  CREATININE 1.20 1.12  CALCIUM 8.9 8.8*   GFR: Estimated Creatinine Clearance: 42.7 mL/min (by C-G formula based on SCr of 1.12 mg/dL). Liver Function Tests:  Recent Labs Lab 07/20/16 2334  AST 16  ALT 10*  ALKPHOS 82  BILITOT 0.7  PROT 7.4  ALBUMIN 3.1*   No results for input(s): LIPASE, AMYLASE in the last 168 hours. No results for input(s): AMMONIA in the last 168 hours. Coagulation Profile: No results for input(s): INR, PROTIME in the last 168 hours. Cardiac Enzymes: No results for input(s): CKTOTAL, CKMB, CKMBINDEX, TROPONINI in the last 168 hours. BNP (last 3 results) No  results for input(s): PROBNP in the last 8760 hours. HbA1C: No results for input(s): HGBA1C in the last 72 hours. CBG: No results for input(s): GLUCAP in the last 168 hours. Lipid Profile: No results for input(s): CHOL, HDL, LDLCALC, TRIG, CHOLHDL, LDLDIRECT in the last 72 hours. Thyroid Function Tests: No results for input(s): TSH, T4TOTAL, FREET4, T3FREE, THYROIDAB in the last 72 hours. Anemia Panel: No results for input(s): VITAMINB12, FOLATE, FERRITIN, TIBC, IRON, RETICCTPCT in the last 72 hours. Urine analysis:    Component Value Date/Time   COLORURINE AMBER (A) 07/21/2016 0648   APPEARANCEUR CLOUDY (A) 07/21/2016 0648   LABSPEC 1.020 07/21/2016 0648   PHURINE 7.0 07/21/2016  0648   GLUCOSEU NEGATIVE 07/21/2016 0648   HGBUR SMALL (A) 07/21/2016 0648   BILIRUBINUR NEGATIVE 07/21/2016 0648   KETONESUR NEGATIVE 07/21/2016 0648   PROTEINUR 100 (A) 07/21/2016 0648   NITRITE NEGATIVE 07/21/2016 0648   LEUKOCYTESUR LARGE (A) 07/21/2016 0648   Sepsis Labs: (procalcitonin:4,lacticidven:4)  ) Recent Results (from the past 240 hour(s))  MRSA PCR Screening     Status: None   Collection Time: 07/21/16  2:20 AM  Result Value Ref Range Status   MRSA by PCR NEGATIVE NEGATIVE Final    Comment:        The GeneXpert MRSA Assay (FDA approved for NASAL specimens only), is one component of a comprehensive MRSA colonization surveillance program. It is not intended to diagnose MRSA infection nor to guide or monitor treatment for MRSA infections.       Radiology Studies: Dg Chest 2 View  Result Date: 07/21/2016 CLINICAL DATA:  Fever this and pain. EXAM: CHEST  2 VIEW COMPARISON:  03/19/2016 FINDINGS: Patchy right base opacity may represent pneumonia. No effusion. Left lung is clear. Normal heart size. IMPRESSION: Right base patchy airspace opacity, possibly pneumonia. Followup PA and lateral chest X-ray is recommended in 3-4 weeks following trial of antibiotic therapy to  ensure resolution and exclude underlying malignancy. Electronically Signed   By: Ellery Plunk M.D.   On: 07/21/2016 00:03     Scheduled Meds: . chlorhexidine  15 mL Mouth Rinse BID  . enoxaparin (LOVENOX) injection  40 mg Subcutaneous Q24H  . levothyroxine  85 mcg Intravenous Daily  . mouth rinse  15 mL Mouth Rinse q12n4p   Continuous Infusions: . sodium chloride 100 mL/hr at 07/21/16 0242  . ceFEPime (MAXIPIME) IV 1 g (07/21/16 1054)  . metronidazole Stopped (07/21/16 0530)  . vancomycin       LOS: 0 days   Time Spent in minutes   30 minutes  Dariah Mcsorley D.O. on 07/21/2016 at 11:36 AM  Between 7am to 7pm - Pager - [phone]  After 7pm go to [email or web address] - password [email or web address]  And look for the night coverage person covering for me after hours  [facility] Office  [phone]

## 2016-07-21 NOTE — Progress Notes (Signed)
Modified Barium Swallow Progress Note  Patient Details  Name: Kristopher Jennings MRN: [id number] Date of Birth: 01-Mar-1941  Today's Date: 07/21/2016  Modified Barium Swallow completed.  Full report located under Chart Review in the Imaging Section.  Brief recommendations include the following:  Clinical Impression  Pt presents with moderately severe oropharyngeal dysphagia with sensorimotor deficits.  Poor lingual and hypopharyngeal motility results in gross residuals that pt does NOT sense.  These residuals are worse with increased viscocity.  Head turn left did not faciliate clearance and dry swallows are weak.  Significant aspiration of nectar present due to decreased laryngeal closure producing delayed nonproductive reflexive cough.   Trace aspiration of thin mixed with secretions apparent - however much fewer pharyngeal residuals present which will likely decrease pulmonary risk with aspiration.  Following solids with liquids helpful to decrease residuals.  Of note, pt and son state he does NOT desire any feeding tube.  Clear liquid diet may be tolerated best at this time with strict precautions including sitting fully upright, small single boluses, dry swallows, intermittent cough/throat clearing.  Cease intake if pt coughing.  Pt will aspirate regardless of dietary consistency.     Also recommend consider palliative consult due to pt's ongoing dysphagia, recurrent pnas and weight loss.   Educated pt/son to recommendations.  SLP to follow up.    Swallow Evaluation Recommendations       SLP Diet Recommendations:  (clear liquids with known aspiration risks and mitigation strategies)   Liquid Administration via: Cup;Straw   Medication Administration: Crushed with puree   Supervision: Staff to assist with self feeding;Full supervision/cueing for compensatory strategies   Compensations: Small sips/bites;Slow rate;Follow solids with liquid (intermittent cough/throat clearing )   Postural Changes: Remain semi-upright after after feeds/meals (Comment);Seated upright at 90 degrees          [name], [place] [facility] SLP [phone]   [name] 07/21/2016,3:47 PM

## 2016-07-21 NOTE — Clinical Social Work Note (Signed)
Clinical Social Work Assessment  Patient Details  Name: Kristopher Jennings MRN: [id number] Date of Birth: 10/05/1940  Date of referral:  07/21/16               Reason for consult:  Facility Placement, Discharge Planning                Permission sought to share information with:  Facility Art therapist granted to share information::  Yes, Verbal Permission Granted  Name::        Agency::     Relationship::     Contact Information:     Housing/Transportation Living arrangements for the past 2 months:  [place] of Information:  Adult Children Patient Interpreter Needed:  None Criminal Activity/Legal Involvement Pertinent to Current Situation/Hospitalization:  No - Comment as needed Significant Relationships:  Adult Children Lives with:  Facility Resident Do you feel safe going back to the place where you live?  Yes Need for family participation in patient care:  Yes (Comment)  Care giving concerns:  No concerns reported by son at this time to CSW. MD notes in PN that son feels pt has been depressed lately and is " giving up ". Son requested palliative consult which MD has ordered.   Social Worker assessment / plan:  Pt hospitalized from [place] on 07/21/16 with Sepsis secondary to HCAP versus aspiration pneumonia. CSW met with pt / son at bedside to offer support and assist with d/c planning. Pt is unable to participate in dc planning due to cognitive deficits. Son reports that he would like pt to return to SNF at d/c. [facility] contacted and clinicals sent for review. CSW will continue to follow to assist with d/c planning needs.  Employment status:  Retired Forensic scientist:  Information systems manager, Medicaid In [place] PT Recommendations:  Not assessed at this time Information / Referral to community resources:     Patient/Family's Response to care:  Son expects pt to return to [facility] at d/c.  Patient/Family's Understanding of and Emotional  Response to Diagnosis, Current Treatment, and Prognosis: Pt's son has spoken with Md and is aware of pt's medical status. Son reports that he is satisfied with care pt receives at [facility]. Son appreciates CSW assistance with d/c planning.  Emotional Assessment Appearance:  Appears stated age Attitude/Demeanor/Rapport:  Other (cooperative) Affect (typically observed):  Calm, Appropriate Orientation:  Oriented to Self Alcohol / Substance use:  Not Applicable Psych involvement (Current and /or in the community):  No (Comment)  Discharge Needs  Concerns to be addressed:  Discharge Planning Concerns Readmission within the last 30 days:  No Current discharge risk:  None Barriers to Discharge:  No Barriers Identified   Kristopher Jennings  [phone] 07/21/2016, 11:37 AM

## 2016-07-21 NOTE — Progress Notes (Signed)
No charge note  Palliative consult request received, patient has just returned from [facility] study.  Chart reviewed, patient was seen by this palliative provider in a previous hospitalization.  Discussed with son at the bedside, he wishes to have a family meeting with the patient, palliative team member and himself on Friday morning at 9 AM.  Full note and further recommendations to follow Thank you for the consult Rosalin Hawking  MD [phone]

## 2016-07-21 NOTE — H&P (Signed)
History and Physical    Kristopher Jennings EAV:[id number] DOB: Nov 03, 1940 DOA: 07/20/2016  PCP: Katy Apo, MD   Patient coming from: SNF  Chief Complaint: Fever, abnormal chest xray  HPI: Kristopher Jennings is a 76 y.o. gentleman with a history of Parkinson's disease with cognitive impairment, prior CVA, essential tremor, dysphagia, and previous admission in January 2018 for sepsis secondary to aspiration pneumonia.  Reportedly, he developed fever today and an outpatient chest xray was obtained, which showed an infiltrate.  The patient received IM Rocephin before being transferred to the ED for further evaluation and treatment.  Upon arrival in the ED, he had mild hypoxia, corrected with 2L [place].  He has a harsh wet cough.  The patient says that he coughs every night.  He denies chest pain.  He has had nausea; he cannot tell me if he has vomited.  It is not clear if an overt aspiration event was noted at his facility.n  He cannot tell me if he has had syncope or recent fall.  ED Course: WBC count 21.4.  Temp 100.4.  BUN 24.  Lactic acid level 1.5.  Chest xray here shows RLL infiltrate.  Patient received vanc and cefepime for HCAP.  Hospitalist asked to admit.  Review of Systems: Unable to obtain due to cognitive impairment.   Past Medical History:  Diagnosis Date  . Anxiety   . Arthritis   . Ataxia   . Cauda equina syndrome (HCC)   . Depression   . Dysphagia   . Essential Tremors   . GERD (gastroesophageal reflux disease)   . Paralysis agitans (HCC) 01/12/2013  . Parkinson disease (HCC)   . Stroke (HCC)   . Thyroid disease   . UTI (lower urinary tract infection)     Past Surgical History:  Procedure Laterality Date  . LAMINECTOMY    . SPINE SURGERY       reports that he has quit smoking. He has never used smokeless tobacco. He reports that he does not drink alcohol or use drugs.  Allergies  Allergen Reactions  . Demerol [Meperidine] Other (See Comments)    Reaction:   Unknown   . Lactose Intolerance (Gi) Other (See Comments)    Reaction:  Unknown   . Nickel Other (See Comments)    Reaction:  Unknown   . Oxycodone Other (See Comments)    Reaction:  Unknown     No family history on file.  Patient is unable to give family history.  I could not reach his son at the number listed on the facesheet for additional history.   Prior to Admission medications   Medication Sig Start Date End Date Taking? Authorizing Provider  acetaminophen (TYLENOL) 325 MG tablet Take 650 mg by mouth every 4 (four) hours as needed for mild pain, moderate pain, fever or headache.    Yes Historical Provider, MD  Amino Acids-Protein Hydrolys (FEEDING SUPPLEMENT, PRO-STAT SUGAR FREE 64,) LIQD Take 30 mLs by mouth daily.   Yes Historical Provider, MD  buPROPion (WELLBUTRIN SR) 200 MG 12 hr tablet Take 200 mg by mouth daily before breakfast.    Yes Historical Provider, MD  Cholecalciferol (VITAMIN D) 2000 units CAPS Take 2,000 Units by mouth daily.    Yes Historical Provider, MD  clonazePAM (KLONOPIN) 0.5 MG tablet Take 0.5 mg by mouth daily.   Yes Historical Provider, MD  Dextromethorphan-Quinidine (NUEDEXTA) 20-10 MG CAPS Take 1 capsule by mouth 2 (two) times daily.   Yes Historical Provider, MD  levothyroxine (  SYNTHROID, LEVOTHROID) 175 MCG tablet Take 175 mcg by mouth daily before breakfast.   Yes Historical Provider, MD  Melatonin 3 MG TABS Take 3 mg by mouth at bedtime.   Yes Historical Provider, MD  QUEtiapine (SEROQUEL) 50 MG tablet Take 50 mg by mouth 2 (two) times daily.   Yes Historical Provider, MD  senna (SENOKOT) 8.6 MG TABS tablet Take 1 tablet by mouth at bedtime.   Yes Historical Provider, MD  tamsulosin (FLOMAX) 0.4 MG CAPS capsule Take 0.8 mg by mouth at bedtime.    Yes Historical Provider, MD    Physical Exam: Vitals:   07/20/16 2330 07/21/16 0002 07/21/16 0100 07/21/16 0130  BP: 105/78  120/76 119/73  Pulse: 92     Resp: (!) 25  (!) 29 (!) 22  Temp:        TempSrc:      SpO2: 95% 96% 91% 95%  Weight:  58.5 kg (128 lb 15.5 oz)        Constitutional: NAD, calm, chronically ill appearing, asking for water Vitals:   07/20/16 2330 07/21/16 0002 07/21/16 0100 07/21/16 0130  BP: 105/78  120/76 119/73  Pulse: 92     Resp: (!) 25  (!) 29 (!) 22  Temp:      TempSrc:      SpO2: 95% 96% 91% 95%  Weight:  58.5 kg (128 lb 15.5 oz)     Eyes: PERRL, lids and conjunctivae normal ENMT: Mucous membranes are dry, thick white secretions in posterior oropharynx.  Poor dentition. Neck: normal appearance, supple, no masses Respiratory: Coarse bilaterally.  Normal respiratory effort. No accessory muscle use.  Cardiovascular: Normal rate, regular rhythm, no murmurs / rubs / gallops. No extremity edema. 2+ pedal pulses.  GI: abdomen is soft and compressible.  No distention.  No tenderness.  Bowel sounds are hyperactive. Musculoskeletal:  No joint deformity in upper and lower extremities. No contractures. Normal muscle tone.  Skin: no rashes, Pale, cool, dry. Neurologic: Generalized weakness.  No apparent focal deficits. Psychiatric: Oriented to person, place, year.  Flat affect.  Speech pattern is slow.  Judgment impaired.    Labs on Admission: I have personally reviewed following labs and imaging studies  CBC:  Recent Labs Lab 07/20/16 2334  WBC 21.4*  NEUTROABS 17.9*  HGB 15.2  HCT 44.2  MCV 89.7  PLT 181   Basic Metabolic Panel:  Recent Labs Lab 07/20/16 2334  NA 140  K 4.1  CL 105  CO2 24  GLUCOSE 135*  BUN 24*  CREATININE 1.20  CALCIUM 8.9   GFR: Estimated Creatinine Clearance: 44.7 mL/min (by C-G formula based on SCr of 1.2 mg/dL).  Urine analysis:    Component Value Date/Time   COLORURINE YELLOW 03/19/2016 1033   APPEARANCEUR CLOUDY (A) 03/19/2016 1033   LABSPEC 1.019 03/19/2016 1033   PHURINE 8.0 03/19/2016 1033   GLUCOSEU NEGATIVE 03/19/2016 1033   HGBUR SMALL (A) 03/19/2016 1033   BILIRUBINUR NEGATIVE 03/19/2016  1033   KETONESUR NEGATIVE 03/19/2016 1033   PROTEINUR 100 (A) 03/19/2016 1033   NITRITE NEGATIVE 03/19/2016 1033   LEUKOCYTESUR LARGE (A) 03/19/2016 1033   Sepsis Labs:  Lactic acid level 1.5.  Radiological Exams on Admission: Dg Chest 2 View  Result Date: 07/21/2016 CLINICAL DATA:  Fever this and pain. EXAM: CHEST  2 VIEW COMPARISON:  03/19/2016 FINDINGS: Patchy right base opacity may represent pneumonia. No effusion. Left lung is clear. Normal heart size. IMPRESSION: Right base patchy airspace opacity, possibly pneumonia. Followup  PA and lateral chest X-ray is recommended in 3-4 weeks following trial of antibiotic therapy to ensure resolution and exclude underlying malignancy. Electronically Signed   By: Ellery Plunk M.D.   On: 07/21/2016 00:03    EKG: Independently reviewed. NSR.  Artifact.  Assessment/Plan Principal Problem:   HCAP (healthcare-associated pneumonia) Active Problems:   Benign essential tremor   Hypothyroidism   Essential hypertension, benign   Sepsis, unspecified organism (HCC)      Early sepsis secondary to HCAP, possible aspiration, present on admission --Add flagyl to vanc and cefepime --Blood cultures --Sputum culture if the patient can produce a sample --NPO for now --Speech therapy consult in the AM --Hold all oral medications for now --Aspiration precautions --Oral care --Albuterol nebs prn  Hypothyroidism --IV levothyroxine while NPO    DVT prophylaxis: Lovenox Code Status: DNR documented at last discharge but I am unable to confirm with next of kin at this time (and I did not see any papers from SNF at bedside).  FULL CODE until discussed with family. Family Communication: Patient alone in the ED.  He gave me permission to call his son, but there was no answer at the number listed on the facesheet. Disposition Plan: Expect he will go back to SNF when ready for discharge. Consults called: NONE Admission status: Inpatient, stepdown unit.   I expect this patient will need inpatient services for greater than two midnights.   TIME SPENT: 60 minutes   Jerene Bears MD [facility] Pager [phone]  If 7PM-7AM, please contact night-coverage [email or web address] Password [email or web address]  07/21/2016, 1:56 AM

## 2016-07-21 NOTE — Care Management Note (Signed)
Case Management Note  Patient Details  Name: Kristopher Jennings MRN: [id number] Date of Birth: Jun 01, 1940  Subjective/Objective:             pna via cxr       Action/Plan:  fro m snf Will follow for any dc needs  Expected Discharge Date:     [id number]             Expected Discharge Plan:  Skilled Nursing Facility  In-House Referral:  Clinical Social Work  Discharge planning Services     Post Acute Care Choice:    Choice offered to:     DME Arranged:    DME Agency:     HH Arranged:    HH Agency:     Status of Service:  In process, will continue to follow  If discussed at Long Length of Stay Meetings, dates discussed:    Additional Comments:  Golda Acre, RN 07/21/2016, 10:23 AM

## 2016-07-21 NOTE — Progress Notes (Signed)
Pharmacy Antibiotic Note  Kristopher Jennings is a 76 y.o. male with fever admitted on 07/20/2016 with pneumonia.  Pharmacy has been consulted for vancomycin dosing.  Plan: Cefepime 1 Gm IV q12h (CrCl~44 ml/min) Vancomycin 750 mg x1 then 500 mg IV q12h VT=15-20 mg/L  Weight: 128 lb 15.5 oz (58.5 kg)  Temp (24hrs), Avg:99.2 F (37.3 C), Min:98 F (36.7 C), Max:100.4 F (38 C)   Recent Labs Lab 07/20/16 2334 07/20/16 2344  WBC 21.4*  --   CREATININE 1.20  --   LATICACIDVEN  --  1.50    Estimated Creatinine Clearance: 44.7 mL/min (by C-G formula based on SCr of 1.2 mg/dL).    Allergies  Allergen Reactions  . Demerol [Meperidine] Other (See Comments)    Reaction:  Unknown   . Lactose Intolerance (Gi) Other (See Comments)    Reaction:  Unknown   . Nickel Other (See Comments)    Reaction:  Unknown   . Oxycodone Other (See Comments)    Reaction:  Unknown     Antimicrobials this admission: 5/2 cefepime >>  5/2 vancomycin >>   Dose adjustments this admission:   Microbiology results:  BCx:   UCx:    Sputum:    MRSA PCR:   Thank you for allowing pharmacy to be a part of this patient's care.  Lorenza Evangelist 07/21/2016 12:31 AM

## 2016-07-21 NOTE — Evaluation (Signed)
Clinical/Bedside Swallow Evaluation Patient Details  Name: Avi Kerschner MRN: [id number] Date of Birth: 06/16/1940  Today's Date: 07/21/2016 Time: SLP Start Time (ACUTE ONLY): 1059 SLP Stop Time (ACUTE ONLY): 1143 SLP Time Calculation (min) (ACUTE ONLY): 44 min  Past Medical History:  Past Medical History:  Diagnosis Date  . Anxiety   . Arthritis   . Ataxia   . Cauda equina syndrome (HCC)   . Depression   . Dysphagia   . Essential Tremors   . GERD (gastroesophageal reflux disease)   . Paralysis agitans (HCC) 01/12/2013  . Parkinson disease (HCC)   . Stroke (HCC)   . Thyroid disease   . UTI (lower urinary tract infection)    Past Surgical History:  Past Surgical History:  Procedure Laterality Date  . LAMINECTOMY    . SPINE SURGERY     HPI:  76 yo male adm to [facility] with AMS, diagnosed with aspiration pneumonia.  Pt with PMH + for dysphagia, recurrent aspiration pneumonias.  Pt has been on puree/honey diet at facility.  Son states pt is thirsty "all the time".  Swallow evaluation ordered.  Son states pt does not have Parkinson's disease but essential tremors.  Noted pill rolling manipulation at baseline.    Assessment / Plan / Recommendation Clinical Impression  Pt presents with symptoms of severe oropharyngeal dysphagia with suspected significant aspiration of honey thick liquids characterized by overt coughing with face turning red.  Cough was not productive with aspiration episode.  Suspect delayed oral transiting/discoordination with likely lingual pumping - ? premature spillage of bolus into pharynx.  Son, Sherrill Raring, present and confirms pt coughing with intake at times prior to admission.  Per discussion with pt/son, pt does not desire feeding tube and will desire po regardless of risk.  Pt is willing to continue modifications in diet if helpful however.  Educated pt and son to multiple risk factors associated with aspiration pneumonias and not just dysphagia.     Front lower  dentition appeared to show decay and ? eroded - advised son/pt to impact on pulmonary health and recommend follow up with dentist.  Son requested dental consult while pt in house.    Educated to chronic risk of aspiration and weakness increasing pna risk.  Will proceed with MBS to allow instrumental evaluation and assure least restrictive diet.   SLP Visit Diagnosis: Dysphagia, oropharyngeal phase (R13.12)    Aspiration Risk  Severe aspiration risk;Risk for inadequate nutrition/hydration    Diet Recommendation NPO except meds        Other  Recommendations     Follow up Recommendations  (TBD)      Frequency and Duration            Prognosis        Swallow Study   General Date of Onset: 07/21/16 HPI: 76 yo male adm to [facility] with AMS, diagnosed with aspiration pneumonia.  Pt with PMH + for dysphagia, recurrent aspiration pneumonias.  Pt has been on puree/honey diet at facility.  Son states pt is thirsty "all the time".  Swallow evaluation ordered.  Type of Study: Bedside Swallow Evaluation Diet Prior to this Study: NPO Temperature Spikes Noted: Yes Respiratory Status: Nasal cannula History of Recent Intubation: No Behavior/Cognition: Alert;Cooperative;Other (Comment) (delayed responses) Oral Cavity Assessment: Dry Oral Care Completed by SLP: Recent completion by staff Oral Cavity - Dentition: Poor condition Vision: Functional for self-feeding Self-Feeding Abilities: Needs assist (hand over hand assist) Patient Positioning: Upright in bed Baseline Vocal Quality: Normal Volitional  Cough: Weak Volitional Swallow: Able to elicit (with effort and delay)    Oral/Motor/Sensory Function Overall Oral Motor/Sensory Function:  (left weakness, facial/labial)   Ice Chips Ice chips: Not tested   Thin Liquid Thin Liquid: Not tested    Nectar Thick Nectar Thick Liquid: Not tested   Honey Thick Honey Thick Liquid: Impaired Presentation: Spoon;Cup Oral Phase Impairments: Reduced lingual  movement/coordination;Reduced labial seal Oral Phase Functional Implications: Prolonged oral transit Pharyngeal Phase Impairments: Multiple swallows;Cough - Delayed Other Comments: overt coughing on honey thick liquids with larger bolus size - cough was extensive and occured over several minutes, unfortuantely cough was not productive   Puree Puree: Not tested   Solid   GO   Solid: Not tested        [name], MS [facility] SLP [phone]

## 2016-07-22 DIAGNOSIS — I10 Essential (primary) hypertension: Secondary | ICD-10-CM

## 2016-07-22 LAB — CBC
HCT: 36.6 % — ABNORMAL LOW (ref 39.0–52.0)
HEMOGLOBIN: 12.1 g/dL — AB (ref 13.0–17.0)
MCH: 30 pg (ref 26.0–34.0)
MCHC: 33.1 g/dL (ref 30.0–36.0)
MCV: 90.6 fL (ref 78.0–100.0)
Platelets: 158 10*3/uL (ref 150–400)
RBC: 4.04 MIL/uL — ABNORMAL LOW (ref 4.22–5.81)
RDW: 13.2 % (ref 11.5–15.5)
WBC: 16.1 10*3/uL — AB (ref 4.0–10.5)

## 2016-07-22 LAB — BASIC METABOLIC PANEL
ANION GAP: 8 (ref 5–15)
BUN: 18 mg/dL (ref 6–20)
CHLORIDE: 112 mmol/L — AB (ref 101–111)
CO2: 21 mmol/L — AB (ref 22–32)
Calcium: 8.6 mg/dL — ABNORMAL LOW (ref 8.9–10.3)
Creatinine, Ser: 0.75 mg/dL (ref 0.61–1.24)
GFR calc non Af Amer: >60 mL/min (ref >60)
Glucose, Bld: 96 mg/dL (ref 65–99)
Potassium: 3.5 mmol/L (ref 3.5–5.1)
Sodium: 141 mmol/L (ref 135–145)

## 2016-07-22 MED ORDER — SODIUM CHLORIDE 0.9 % IV SOLN
1.5000 g | Freq: Three times a day (TID) | INTRAVENOUS | Status: DC
  Administered 2016-07-22 – 2016-07-24 (×7): 1.5 g via INTRAVENOUS
  Filled 2016-07-22 (×8): qty 1.5

## 2016-07-22 NOTE — Progress Notes (Signed)
PT demonstrated hands on ability to perform Flutter device.

## 2016-07-22 NOTE — Progress Notes (Signed)
  Speech Language Pathology Treatment: Dysphagia  Patient Details Name: Kristopher Jennings MRN: [id number] DOB: [birth year] Today's Date: 07/22/2016 Time: 1320-1340 SLP Time Calculation (min) (ACUTE ONLY): 20 min  Assessment / Plan / Recommendation Clinical Impression  SlP posted precaution signs for pt swallowing.  He has chronic dysphagia/aspiration and no diet modification will prevent aspiration.  However thinner consistencies are providing him more comfort (moisture) per pt and he states they are easier for him to swallow.    SLP observed pt with water via cup/tsp today.  Delayed swallow = suspect oral transiting noted with delayed weak cough with likely aspiration. Advised pt that he is aspirating when he is coughing and recommend to follow strict precautions.  Pt clearly expressed desire to continue po despite aspiration.  RN reports pt coughing more with straw than cup - therefore will dc use of straw.   Dysphagia is likely impacting pt's nutritional and pulmonary status.  Informed pt to concept of comfort feeding - not po for nutritional status.  Will follow up dependent on palliative meeting/goals of care next date.  Thanks.   HPI HPI: 76 [facility], diagnosed with aspiration pneumonia.  Pt with PMH + for dysphagia, recurrent aspiration pneumonias.  Pt has been on puree/honey diet at facility.  Son states pt is thirsty "all the time".  Swallow evaluation ordered.       SLP Plan  Continue with current plan of care       Recommendations  Diet recommendations: Thin liquid;Other(comment) (no straw, use tsp if coughing with intake) Liquids provided via: Cup Supervision: Full supervision/cueing for compensatory strategies Compensations: Small sips/bites;Slow rate;Follow solids with liquid (intermittent cough/throat clearing ) Postural Changes and/or Swallow Maneuvers: Seated upright 90 degrees;Upright 30-60 min after meal                Oral Care Recommendations:  Oral care BID Follow up Recommendations:  (TBD) SLP Visit Diagnosis: Dysphagia, oropharyngeal phase (R13.12) Plan: Continue with current plan of care       GO                Donavan Burnetamara Langston [place], MS [facility] SLP [phone]

## 2016-07-22 NOTE — Progress Notes (Signed)
Pharmacy Antibiotic Note  Kristopher Jennings is a 76 y.o. male admitted on 07/20/2016 with sepsis secondary to pneumonia (HCAP vs aspiration pneumonia).  Patient started on Vancomycin, Cefepime, Flagyl.   Narrowing to Unasyn today.  Plan: Unasyn 1.5g IV q8h.  Height: 5\' 9"  (175.3 cm) Weight: 115 lb 1.3 oz (52.2 kg) IBW/kg (Calculated) : 70.7  Temp (24hrs), Avg:97.7 F (36.5 C), Min:97.1 F (36.2 C), Max:98.3 F (36.8 C)   Recent Labs Lab 07/20/16 2334 07/20/16 2344 07/21/16 0255 07/22/16 0351  WBC 21.4*  --  20.5* 16.1*  CREATININE 1.20  --  1.12 0.75  LATICACIDVEN  --  1.50  --   --     Estimated Creatinine Clearance: 59.8 mL/min (by C-G formula based on SCr of 0.75 mg/dL).    Allergies  Allergen Reactions  . Demerol [Meperidine] Other (See Comments)    Reaction:  Unknown   . Lactose Intolerance (Gi) Other (See Comments)    Reaction:  Unknown   . Nickel Other (See Comments)    Reaction:  Unknown   . Oxycodone Other (See Comments)    Reaction:  Unknown     Antimicrobials this admission:  5/2 >> Vancomycin >> 5/3 5/2 >> Cefepime >> 5/3 5/2 >> Flagyl >> 5/3 5/3 >> Unasyn >>  Dose adjustments this admission:  --  Microbiology results:  5/2 BCx: ngtd 5/2 MRSA PCR: negative  Thank you for allowing pharmacy to be a part of this patient's care.  Clance BollRunyon, Kristopher Jennings 07/22/2016 11:11 AM

## 2016-07-22 NOTE — Progress Notes (Signed)
Initial Nutrition Assessment  DOCUMENTATION CODES:   Severe malnutrition in context of chronic illness, Underweight  INTERVENTION:  - Continue diet order per SLP recommendations. - Will monitor for GOC and any nutrition-related needs based on GOC.   NUTRITION DIAGNOSIS:   Malnutrition (severe) related to chronic illness (CVA and Parkinson's) as evidenced by severe depletion of muscle mass, severe depletion of body fat, percent weight loss.  GOAL:   Patient will meet greater than or equal to 90% of their needs  MONITOR:   PO intake, Weight trends, Labs, Other (Comment) (GOC)  REASON FOR ASSESSMENT:   Consult Assessment of nutrition requirement/status  ASSESSMENT:   76 y.o. gentleman with a history of Parkinson's disease with cognitive impairment, prior CVA, essential tremor, dysphagia, and previous admission in January 2018 for sepsis secondary to aspiration pneumonia.  Reportedly, he developed fever today and an outpatient chest xray was obtained, which showed an infiltrate.  The patient received IM Rocephin before being transferred to the ED for further evaluation and treatment.  Pt seen for consult. BMI indicates underweight status. MBS done yesterday afternoon with recommendation for CLD with the understanding that pt will aspirate on all liquid consistencies. No family or visitors present at this time. Pt is minimally interactive and does not readily respond to questions; seems with drawn. Notes indicate pt had stated pt appeared more depressed than usual PTA. Plan for GOC meeting with Palliative Care tomorrow (5/4) at 9 AM.   Physical assessment done to upper body only with severe muscle and fat wasting noted. Per chart review, pt has lost 14 lbs (10.8% body weight) in the past 4 months which is significant for time frame.   Medications reviewed; 85 mcg IV Synthroid/day.  Labs reviewed; Cl: 112 mmol/L, Ca: 8.6 mg/dL.  IVF: NS @ 100 mL/hr.    Diet Order:  Diet clear  liquid Room service appropriate? Yes; Fluid consistency: Thin  Skin:  Reviewed, no issues  Last BM:  5/3  Height:   Ht Readings from Last 1 Encounters:  07/21/16 5\' 9"  (1.753 m)    Weight:   Wt Readings from Last 1 Encounters:  07/21/16 115 lb 1.3 oz (52.2 kg)    Ideal Body Weight:  72.73 kg  BMI:  Body mass index is 16.99 kg/m.  Estimated Nutritional Needs:   Kcal:  1305-1565 (25-30 kcal/kg)  Protein:  63-73 grams (1.2-1.4 grams/kg)  Fluid:  >/= 1.6 L/day  EDUCATION NEEDS:   No education needs identified at this time    Trenton GammonJessica Tajah Noguchi, MS, RD, LDN, CNSC Inpatient Clinical Dietitian Pager # [phone] After hours/weekend pager # [phone]

## 2016-07-22 NOTE — Progress Notes (Signed)
PROGRESS NOTE    Kristopher Jennings  MWU:[id number] DOB: 1940-04-09 DOA: 07/20/2016 PCP: Katy Apo, MD   Chief Complaint  Patient presents with  . Shortness of Breath    Brief Narrative:  HPI on 07/21/2016 by Dr. Michael Litter Kristopher Jennings is a 76 y.o. gentleman with a history of Parkinson's disease with cognitive impairment, prior CVA, essential tremor, dysphagia, and previous admission in January 2018 for sepsis secondary to aspiration pneumonia. Reportedly, he developed fever today and an outpatient chest xray was obtained, which showed an infiltrate.  The patient received IM Rocephin before being transferred to the ED for further evaluation and treatment. Upon arrival in the ED, he had mild hypoxia, corrected with 2L Delta.  He has a harsh wet cough.  The patient says that he coughs every night.  He denies chest pain.  He has had nausea; he cannot tell me if he has vomited.  It is not clear if an overt aspiration event was noted at his facility.n  He cannot tell me if he has had syncope or recent fall.  Assessment & Plan   Sepsis secondary to HCAP versus aspiration pneumonia -Upon admission, patient was febrile with leukocytosis, tachypnea, tachycardia -Chest x-ray showed right base patchy airspace opacity recommended follow-up PA and lateral chest x-ray in 3-4 weeks -Blood cultures show no growth to date -sputum culture pending -Speech consulted and recommended clear liquid with aspiration risks/mitigation strategies  -Patient has had a history of dysphagia -Continue aspiration precautions -Continue nebulizers, oral care -Initially placed on vancomycin, cefepime, flagyl. Will transition to IV Augmentin -Continue flutter valve  Hypothyroidism -Was placed on IV Synthroid due to NPO status -Will transition to PO synthroid today if patient can tolerate  Dysphagia  -Secondary to history of CVA/Deep brain stimulator  Essential Tremors/Parkinson's  -Has had deep brain stimulator  placed -Spoke with son, patient's tremors are not related to Parkinson's -Nuedexta held- will restart today  Depression -Wellbutrin/seroquel held- will restart today  Asymptomatic bacteriuria  -patient does self cath -UA: No bacteria, TNTC WBC, large leukocytes   Malnutrition -Likely secondary to dysphagia -Will consult nutrition  Goals of care -Had discussion with son via phone. Patient has been depressed lately and has been dealing with dysphagia for some time after his CVA after deep brain stimulator. Son feels patient is giving up. Would like palliative care consulted and to discuss options of hospice. -Currently DNR (confirmed with son) -?MOST form -Palliative care consulted and appreciated- plan for meeting on 5/4 at 9am  DVT Prophylaxis  Lovenox  Code Status: DNR  Family Communication: None at bedside. Son via phone  Disposition Plan: Admitted. Pending palliative care consult. SNF when stable/improved  Consultants Palliative care  Procedures  None   Antibiotics   Anti-infectives    Start     Dose/Rate Route Frequency Ordered Stop   07/21/16 1200  vancomycin (VANCOCIN) 500 mg in sodium chloride 0.9 % 100 mL IVPB     500 mg 100 mL/hr over 60 Minutes Intravenous Every 12 hours 07/21/16 0139     07/21/16 1000  ceFEPIme (MAXIPIME) 1 g in dextrose 5 % 50 mL IVPB     1 g 100 mL/hr over 30 Minutes Intravenous Every 12 hours 07/21/16 0152 07/29/16 0959   07/21/16 0415  metroNIDAZOLE (FLAGYL) IVPB 500 mg     500 mg 100 mL/hr over 60 Minutes Intravenous Every 8 hours 07/21/16 0404     07/21/16 0100  vancomycin (VANCOCIN) IVPB 750 mg/150 ml premix  750 mg 150 mL/hr over 60 Minutes Intravenous STAT 07/21/16 0030 07/21/16 0225   07/21/16 0030  ceFEPIme (MAXIPIME) 1 g in dextrose 5 % 50 mL IVPB     1 g 100 mL/hr over 30 Minutes Intravenous  Once 07/21/16 0021 07/21/16 0118      Subjective:   Kristopher Jennings seen and examined today.  Feels mildly improved and  feels cough has improved. Still unable to cough much up. Denies chest pain, abdominal pain, nausea, vomiting, dizziness, headache. Feels thirsty, not hungry today.  Objective:   Vitals:   07/22/16 0200 07/22/16 0341 07/22/16 0400 07/22/16 0600  BP: (!) 123/55  126/77 130/73  Pulse: 67  73 68  Resp: 17  20 20   Temp:  98 F (36.7 C)    TempSrc:  Oral    SpO2: 98%  97% 97%  Weight:      Height:        Intake/Output Summary (Last 24 hours) at 07/22/16 1030 Last data filed at 07/22/16 0800  Gross per 24 hour  Intake             3200 ml  Output              685 ml  Net             2515 ml   Filed Weights   07/21/16 0002 07/21/16 0230  Weight: 58.5 kg (128 lb 15.5 oz) 52.2 kg (115 lb 1.3 oz)    Exam  General: Well developed, Cachectic elderly male, NAD  HEENT: NCAT, mucous membranes moist  Cardiovascular: S1 S2 auscultated, RRR, no murmurs appreciated  Respiratory: Coarse breath sounds, diminished breath sounds  Abdomen: Soft, nontender, nondistended, + bowel sounds  Extremities: warm dry without cyanosis clubbing or edema  Neuro: AAOx3, nonfocal  Psych: Depressed mood, flat affect   Data Reviewed: I have personally reviewed following labs and imaging studies  CBC:  Recent Labs Lab 07/20/16 2334 07/21/16 0255 07/22/16 0351  WBC 21.4* 20.5* 16.1*  NEUTROABS 17.9*  --   --   HGB 15.2 15.2 12.1*  HCT 44.2 44.2 36.6*  MCV 89.7 89.7 90.6  PLT 181 165 158   Basic Metabolic Panel:  Recent Labs Lab 07/20/16 2334 07/21/16 0255 07/22/16 0351  NA 140 140 141  K 4.1 3.9 3.5  CL 105 106 112*  CO2 24 23 21*  GLUCOSE 135* 169* 96  BUN 24* 26* 18  CREATININE 1.20 1.12 0.75  CALCIUM 8.9 8.8* 8.6*   GFR: Estimated Creatinine Clearance: 59.8 mL/min (by C-G formula based on SCr of 0.75 mg/dL). Liver Function Tests:  Recent Labs Lab 07/20/16 2334  AST 16  ALT 10*  ALKPHOS 82  BILITOT 0.7  PROT 7.4  ALBUMIN 3.1*   No results for input(s): LIPASE,  AMYLASE in the last 168 hours. No results for input(s): AMMONIA in the last 168 hours. Coagulation Profile: No results for input(s): INR, PROTIME in the last 168 hours. Cardiac Enzymes: No results for input(s): CKTOTAL, CKMB, CKMBINDEX, TROPONINI in the last 168 hours. BNP (last 3 results) No results for input(s): PROBNP in the last 8760 hours. HbA1C: No results for input(s): HGBA1C in the last 72 hours. CBG: No results for input(s): GLUCAP in the last 168 hours. Lipid Profile: No results for input(s): CHOL, HDL, LDLCALC, TRIG, CHOLHDL, LDLDIRECT in the last 72 hours. Thyroid Function Tests: No results for input(s): TSH, T4TOTAL, FREET4, T3FREE, THYROIDAB in the last 72 hours. Anemia Panel: No results for  input(s): VITAMINB12, FOLATE, FERRITIN, TIBC, IRON, RETICCTPCT in the last 72 hours. Urine analysis:    Component Value Date/Time   COLORURINE AMBER (A) 07/21/2016 0648   APPEARANCEUR CLOUDY (A) 07/21/2016 0648   LABSPEC 1.020 07/21/2016 0648   PHURINE 7.0 07/21/2016 0648   GLUCOSEU NEGATIVE 07/21/2016 0648   HGBUR SMALL (A) 07/21/2016 0648   BILIRUBINUR NEGATIVE 07/21/2016 0648   KETONESUR NEGATIVE 07/21/2016 0648   PROTEINUR 100 (A) 07/21/2016 0648   NITRITE NEGATIVE 07/21/2016 0648   LEUKOCYTESUR LARGE (A) 07/21/2016 0648   Sepsis Labs: @LABRCNTIP (procalcitonin:4,lacticidven:4)  ) Recent Results (from the past 240 hour(s))  MRSA PCR Screening     Status: None   Collection Time: 07/21/16  2:20 AM  Result Value Ref Range Status   MRSA by PCR NEGATIVE NEGATIVE Final    Comment:        The GeneXpert MRSA Assay (FDA approved for NASAL specimens only), is one component of a comprehensive MRSA colonization surveillance program. It is not intended to diagnose MRSA infection nor to guide or monitor treatment for MRSA infections.   Culture, blood (routine x 2) Call MD if unable to obtain prior to antibiotics being given     Status: None (Preliminary result)    Collection Time: 07/21/16  2:55 AM  Result Value Ref Range Status   Specimen Description BLOOD RIGHT HAND  Final   Special Requests   Final    BOTTLES DRAWN AEROBIC ONLY Blood Culture adequate volume   Culture   Final    NO GROWTH 1 DAY Performed at [facility], [address], [place], [place] [address]    Report Status PENDING  Incomplete  Culture, blood (routine x 2) Call MD if unable to obtain prior to antibiotics being given     Status: None (Preliminary result)   Collection Time: 07/21/16  2:55 AM  Result Value Ref Range Status   Specimen Description BLOOD LEFT ANTECUBITAL  Final   Special Requests   Final    BOTTLES DRAWN AEROBIC AND ANAEROBIC Blood Culture results may not be optimal due to an excessive volume of blood received in culture bottles   Culture   Final    NO GROWTH 1 DAY Performed at [facility], [address], [place], [place] [address]    Report Status PENDING  Incomplete      Radiology Studies: Dg Chest 2 View  Result Date: 07/21/2016 CLINICAL DATA:  Fever this and pain. EXAM: CHEST  2 VIEW COMPARISON:  03/19/2016 FINDINGS: Patchy right base opacity may represent pneumonia. No effusion. Left lung is clear. Normal heart size. IMPRESSION: Right base patchy airspace opacity, possibly pneumonia. Followup PA and lateral chest X-ray is recommended in 3-4 weeks following trial of antibiotic therapy to ensure resolution and exclude underlying malignancy. Electronically Signed   By: Ellery Plunk M.D.   On: 07/21/2016 00:03   Dg Swallowing Func-speech Pathology  Result Date: 07/21/2016 Objective Swallowing Evaluation: Type of Study: MBS-Modified Barium Swallow Study Patient Details Name: Kristopher Jennings MRN: [id number] Date of Birth: Sep 16, 1940 Today's Date: 07/21/2016 Time: SLP Start Time (ACUTE ONLY): 1410-SLP Stop Time (ACUTE ONLY): 1450 SLP Time Calculation (min) (ACUTE ONLY): 40 min Past Medical History: Past Medical History: Diagnosis Date . Anxiety  .  Arthritis  . Ataxia  . Cauda equina syndrome (HCC)  . Depression  . Dysphagia  . Essential Tremors  . GERD (gastroesophageal reflux disease)  . Paralysis agitans (HCC) 01/12/2013 . Parkinson disease (HCC)  . Stroke (  HCC)  . Thyroid disease  . UTI (lower urinary tract infection)  Past Surgical History: Past Surgical History: Procedure Laterality Date . LAMINECTOMY   . SPINE SURGERY   HPI: 76 yo male adm to [facility] with AMS, diagnosed with aspiration pneumonia.  Pt with PMH + for dysphagia, recurrent aspiration pneumonias.  Pt has been on puree/honey diet at facility.  Son states pt is thirsty "all the time".  Swallow evaluation ordered.  Subjective: pt awake in bed, son present Assessment / Plan / Recommendation CHL IP CLINICAL IMPRESSIONS 07/21/2016 Clinical Impression Pt presents with moderately severe oropharyngeal dysphagia with sensorimotor deficits.  Poor lingual and hypopharyngeal motility results in gross residuals that pt does NOT sense.  These residuals are worse with increased viscocity.  Head turn left did not faciliate clearance.  Significant aspiration of nectar present due to decreased laryngeal closure producing weak and delayed reflexive cough.   Trace aspiration of thin mixed with secretions apparent - however much fewer pharyngeal residuals present which will likely decrease pulmonary risk with aspiration.  Following solids with liquids helpful to decrease residuals.  Of note, pt and son state he does NOT desire any feeding tube.  Clear liquid diet may be tolerated best at this time with strict precautions including sitting fully upright, small single boluses, dry swallows, intermittent cough/throat clearing.  Cease intake if pt coughing.  Pt will aspirate regardless of dietary consistency.  Also recommend consider palliative consult due to pt's ongoing dysphagia, recurrent pnas and weight loss. Educated pt/son to recommendations.  SLP to follow up.  SLP Visit Diagnosis Dysphagia, oropharyngeal phase  (R13.12) Attention and concentration deficit following -- Frontal lobe and executive function deficit following -- Impact on safety and function Severe aspiration risk;Risk for inadequate nutrition/hydration   CHL IP TREATMENT RECOMMENDATION 07/21/2016 Treatment Recommendations Defer until completion of intrumental exam   Prognosis 07/21/2016 Prognosis for Safe Diet Advancement Fair Barriers to Reach Goals Time post onset;Severity of deficits Barriers/Prognosis Comment -- CHL IP DIET RECOMMENDATION 07/21/2016 SLP Diet Recommendations (No Data) Liquid Administration via Cup;Straw Medication Administration Crushed with puree Compensations Small sips/bites;Slow rate;Follow solids with liquid Postural Changes Remain semi-upright after after feeds/meals (Comment);Seated upright at 90 degrees   CHL IP OTHER RECOMMENDATIONS 03/20/2016 Recommended Consults -- Oral Care Recommendations Oral care BID Other Recommendations Order thickener from pharmacy;Have oral suction available;Remove water pitcher;Prohibited food (jello, ice cream, thin soups)   CHL IP FOLLOW UP RECOMMENDATIONS 07/21/2016 Follow up Recommendations (No Data)   CHL IP FREQUENCY AND DURATION 07/21/2016 Speech Therapy Frequency (ACUTE ONLY) min 1 x/week Treatment Duration 1 week      CHL IP ORAL PHASE 07/21/2016 Oral Phase Impaired Oral - Pudding Teaspoon -- Oral - Pudding Cup -- Oral - Honey Teaspoon Delayed oral transit;Decreased bolus cohesion;Reduced posterior propulsion;Weak lingual manipulation Oral - Honey Cup Reduced posterior propulsion;Delayed oral transit;Weak lingual manipulation;Decreased bolus cohesion Oral - Nectar Teaspoon Delayed oral transit;Reduced posterior propulsion;Weak lingual manipulation Oral - Nectar Cup Weak lingual manipulation;Reduced posterior propulsion;Delayed oral transit Oral - Nectar Straw Weak lingual manipulation;Reduced posterior propulsion;Delayed oral transit Oral - Thin Teaspoon Weak lingual manipulation;Delayed oral  transit;Reduced posterior propulsion Oral - Thin Cup Weak lingual manipulation;Delayed oral transit;Reduced posterior propulsion Oral - Thin Straw Weak lingual manipulation;Reduced posterior propulsion;Delayed oral transit Oral - Puree Weak lingual manipulation;Delayed oral transit;Decreased bolus cohesion;Reduced posterior propulsion Oral - Mech Soft Weak lingual manipulation;Decreased bolus cohesion;Impaired mastication;Reduced posterior propulsion;Delayed oral transit Oral - Regular -- Oral - Multi-Consistency -- Oral - Pill -- Oral Phase - Comment --  CHL IP PHARYNGEAL PHASE 07/21/2016 Pharyngeal Phase Impaired Pharyngeal- Pudding Teaspoon -- Pharyngeal -- Pharyngeal- Pudding Cup -- Pharyngeal -- Pharyngeal- Honey Teaspoon Reduced pharyngeal peristalsis;Reduced epiglottic inversion;Reduced anterior laryngeal mobility;Reduced laryngeal elevation;Reduced airway/laryngeal closure;Reduced tongue base retraction;Pharyngeal residue - valleculae Pharyngeal -- Pharyngeal- Honey Cup Reduced pharyngeal peristalsis;Reduced epiglottic inversion;Reduced anterior laryngeal mobility;Reduced laryngeal elevation;Reduced airway/laryngeal closure;Reduced tongue base retraction;Pharyngeal residue - valleculae;Pharyngeal residue - pyriform Pharyngeal -- Pharyngeal- Nectar Teaspoon Reduced pharyngeal peristalsis;Reduced epiglottic inversion;Reduced anterior laryngeal mobility;Reduced laryngeal elevation;Reduced airway/laryngeal closure;Reduced tongue base retraction;Pharyngeal residue - valleculae;Pharyngeal residue - pyriform;Penetration/Aspiration during swallow Pharyngeal Material enters airway, remains ABOVE vocal cords and not ejected out Pharyngeal- Nectar Cup Delayed swallow initiation-vallecula;Reduced pharyngeal peristalsis;Reduced epiglottic inversion;Reduced anterior laryngeal mobility;Reduced laryngeal elevation;Reduced airway/laryngeal closure;Reduced tongue base retraction;Penetration/Aspiration during swallow;Significant  aspiration (Amount);Pharyngeal residue - valleculae Pharyngeal Material enters airway, passes BELOW cords and not ejected out despite cough attempt by patient;Material enters airway, passes BELOW cords without attempt by patient to eject out (silent aspiration) Pharyngeal- Nectar Straw Delayed swallow initiation-vallecula;Reduced pharyngeal peristalsis;Reduced epiglottic inversion;Reduced anterior laryngeal mobility;Reduced laryngeal elevation;Reduced tongue base retraction;Penetration/Aspiration during swallow;Pharyngeal residue - valleculae Pharyngeal Material enters airway, CONTACTS cords and not ejected out;Material enters airway, passes BELOW cords then ejected out Pharyngeal- Thin Teaspoon Penetration/Aspiration during swallow;Reduced pharyngeal peristalsis;Reduced epiglottic inversion;Reduced anterior laryngeal mobility;Reduced laryngeal elevation;Reduced airway/laryngeal closure;Reduced tongue base retraction;Pharyngeal residue - valleculae Pharyngeal Material enters airway, remains ABOVE vocal cords and not ejected out Pharyngeal- Thin Cup Reduced pharyngeal peristalsis;Reduced epiglottic inversion;Reduced anterior laryngeal mobility;Reduced laryngeal elevation;Reduced airway/laryngeal closure;Reduced tongue base retraction;Penetration/Aspiration during swallow;Pharyngeal residue - valleculae;Pharyngeal residue - pyriform;Trace aspiration Pharyngeal Material enters airway, CONTACTS cords and then ejected out Pharyngeal- Thin Straw Reduced pharyngeal peristalsis;Reduced epiglottic inversion;Reduced anterior laryngeal mobility;Reduced laryngeal elevation;Reduced airway/laryngeal closure;Reduced tongue base retraction;Pharyngeal residue - valleculae;Penetration/Aspiration during swallow Pharyngeal Material enters airway, passes BELOW cords without attempt by patient to eject out (silent aspiration) Pharyngeal- Puree Reduced tongue base retraction;Pharyngeal residue - valleculae;Reduced airway/laryngeal  closure;Reduced pharyngeal peristalsis;Reduced epiglottic inversion Pharyngeal -- Pharyngeal- Mechanical Soft Reduced tongue base retraction;Pharyngeal residue - valleculae;Reduced pharyngeal peristalsis;Reduced epiglottic inversion Pharyngeal -- Pharyngeal- Regular -- Pharyngeal -- Pharyngeal- Multi-consistency -- Pharyngeal -- Pharyngeal- Pill -- Pharyngeal -- Pharyngeal Comment --    Donavan Burnetamara Kimball, MS [facility] SLP [phone]           Scheduled Meds: . chlorhexidine  15 mL Mouth Rinse BID  . enoxaparin (LOVENOX) injection  40 mg Subcutaneous Q24H  . levothyroxine  85 mcg Intravenous Daily  . mouth rinse  15 mL Mouth Rinse q12n4p   Continuous Infusions: . sodium chloride 100 mL/hr at 07/22/16 0349  . ceFEPime (MAXIPIME) IV Stopped (07/21/16 2148)  . metronidazole Stopped (07/22/16 [id number])  . vancomycin Stopped (07/22/16 0027)     LOS: 1 day   Time Spent in minutes   30 minutes  Anh Bigos D.O. on 07/22/2016 at 10:30 AM  Between 7am to 7pm - Pager - [phone]  After 7pm go to [email or web address] - password [email or web address]  And look for the night coverage person covering for me after hours  [facility] Office  [phone]

## 2016-07-23 DIAGNOSIS — E43 Unspecified severe protein-calorie malnutrition: Secondary | ICD-10-CM

## 2016-07-23 MED ORDER — LEVOTHYROXINE SODIUM 25 MCG PO TABS
175.0000 ug | ORAL_TABLET | Freq: Every day | ORAL | Status: DC
  Administered 2016-07-24: 175 ug via ORAL
  Filled 2016-07-23: qty 1

## 2016-07-23 MED ORDER — BUPROPION HCL ER (SR) 100 MG PO TB12
200.0000 mg | ORAL_TABLET | Freq: Every day | ORAL | Status: DC
  Administered 2016-07-24: 200 mg via ORAL
  Filled 2016-07-23: qty 2

## 2016-07-23 MED ORDER — CLONAZEPAM 0.5 MG PO TABS
0.5000 mg | ORAL_TABLET | Freq: Every day | ORAL | Status: DC
  Administered 2016-07-23 – 2016-07-24 (×2): 0.5 mg via ORAL
  Filled 2016-07-23 (×2): qty 1

## 2016-07-23 MED ORDER — TAMSULOSIN HCL 0.4 MG PO CAPS
0.8000 mg | ORAL_CAPSULE | Freq: Every day | ORAL | Status: DC
  Administered 2016-07-23: 0.8 mg via ORAL
  Filled 2016-07-23: qty 2

## 2016-07-23 MED ORDER — VITAMIN D 1000 UNITS PO TABS
2000.0000 [IU] | ORAL_TABLET | Freq: Every day | ORAL | Status: DC
  Administered 2016-07-23 – 2016-07-24 (×2): 2000 [IU] via ORAL
  Filled 2016-07-23 (×2): qty 2

## 2016-07-23 MED ORDER — BUPROPION HCL ER (SR) 100 MG PO TB12: 200.0000 mg | ORAL_TABLET | Freq: Every day | ORAL | Status: DC

## 2016-07-23 MED ORDER — DEXTROMETHORPHAN-QUINIDINE 20-10 MG PO CAPS
1.0000 | ORAL_CAPSULE | Freq: Two times a day (BID) | ORAL | Status: DC
  Administered 2016-07-23 – 2016-07-24 (×2): 1 via ORAL
  Filled 2016-07-23 (×3): qty 1

## 2016-07-23 MED ORDER — QUETIAPINE FUMARATE 25 MG PO TABS
50.0000 mg | ORAL_TABLET | Freq: Two times a day (BID) | ORAL | Status: DC
  Administered 2016-07-23 – 2016-07-24 (×3): 50 mg via ORAL
  Filled 2016-07-23 (×3): qty 2

## 2016-07-23 NOTE — Progress Notes (Signed)
PROGRESS NOTE    Kristopher Jennings  ZOX:[id number] DOB: [birth year] DOA: 07/20/2016 PCP: Katy ApoPOLITE,RONALD D, MD   Chief Complaint  Patient presents with  . Shortness of Breath    Brief Narrative:  HPI on 07/21/2016 by Dr. Michael LitterNikki Carter Kristopher Jennings is a 76 y.o. gentleman with a history of Parkinson's disease with cognitive impairment, prior CVA, essential tremor, dysphagia, and previous admission in January 2018 for sepsis secondary to aspiration pneumonia. Reportedly, he developed fever today and an outpatient chest xray was obtained, which showed an infiltrate.  The patient received IM Rocephin before being transferred to the ED for further evaluation and treatment. Upon arrival in the ED, he had mild hypoxia, corrected with 2L [place].  He has a harsh wet cough.  The patient says that he coughs every night.  He denies chest pain.  He has had nausea; he cannot tell me if he has vomited.  It is not clear if an overt aspiration event was noted at his facility.n  He cannot tell me if he has had syncope or recent fall.  Interim history  Admitted for sepsis secondary to pneumonia. Palliative care consulted.  Assessment & Plan   Sepsis secondary to HCAP versus aspiration pneumonia -Upon admission, patient was febrile with leukocytosis, tachypnea, tachycardia -Sepsis morphology improving  -Chest x-ray showed right base patchy airspace opacity recommended follow-up PA and lateral chest x-ray in 3-4 weeks -Blood cultures show no growth to date -Speech consulted and recommended clear liquid with aspiration risks/mitigation strategies  -Patient has had a history of dysphagia -Continue aspiration precautions -Continue nebulizers, oral care -Initially placed on vancomycin, cefepime, flagyl. Transitioned to IV unasyn -Continue flutter valve  Hypothyroidism -Was placed on IV Synthroid due to NPO status -Currently on PO synthroid  Dysphagia  -Secondary to history of CVA/Deep brain  stimulator  Essential Tremors/Parkinson's  -Has had deep brain stimulator placed -Spoke with son, patient's tremors are not related to Parkinson's -Continue Nuedexta   Depression -Restarted Wellbutrin/seroquel  Asymptomatic bacteriuria  -patient does self cath -UA: No bacteria, TNTC WBC, large leukocytes   Severe Malnutrition -Likely secondary to dysphagia -Nutrition consulted  Goals of care -Had discussion with son via phone. Patient has been depressed lately and has been dealing with dysphagia for some time after his CVA after deep brain stimulator. Son feels patient is giving up. Would like palliative care consulted and to discuss options of hospice. -Currently DNR (confirmed with son) -?MOST form -Palliative care consulted and appreciated, meeting today with son and patient at 9am -patient will be discharged back to SNF with hospice vs palliative care to follow   DVT Prophylaxis  Lovenox  Code Status: DNR  Family Communication: Son at bedside  Disposition Plan: Admitted. Possibly discharge to SNF with hospice vs palliative care in 24 hours  Consultants Palliative care  Procedures  None   Antibiotics   Anti-infectives    Start     Dose/Rate Route Frequency Ordered Stop   07/22/16 1400  ampicillin-sulbactam (UNASYN) 1.5 g in sodium chloride 0.9 % 50 mL IVPB     1.5 g 100 mL/hr over 30 Minutes Intravenous Every 8 hours 07/22/16 1111     07/21/16 1200  vancomycin (VANCOCIN) 500 mg in sodium chloride 0.9 % 100 mL IVPB  Status:  Discontinued     500 mg 100 mL/hr over 60 Minutes Intravenous Every 12 hours 07/21/16 0139 07/22/16 1109   07/21/16 1000  ceFEPIme (MAXIPIME) 1 g in dextrose 5 % 50 mL IVPB  Status:  Discontinued     1 g 100 mL/hr over 30 Minutes Intravenous Every 12 hours 07/21/16 0152 07/22/16 1109   07/21/16 0415  metroNIDAZOLE (FLAGYL) IVPB 500 mg  Status:  Discontinued     500 mg 100 mL/hr over 60 Minutes Intravenous Every 8 hours 07/21/16 0404 07/22/16  1109   07/21/16 0100  vancomycin (VANCOCIN) IVPB 750 mg/150 ml premix     750 mg 150 mL/hr over 60 Minutes Intravenous STAT 07/21/16 0030 07/21/16 0225   07/21/16 0030  ceFEPIme (MAXIPIME) 1 g in dextrose 5 % 50 mL IVPB     1 g 100 mL/hr over 30 Minutes Intravenous  Once 07/21/16 0021 07/21/16 0118      Subjective:   Kristopher Jennings seen and examined today.  Patient continues to cough but unable to clear secretions or phlegm. Denies chest pain, abdominal pain, N/V, headache, dizziness.   Objective:   Vitals:   07/22/16 1909 07/22/16 2212 07/23/16 0443 07/23/16 0556  BP: (!) 99/52 (!) 104/53 (!) 110/59   Pulse: (!) 101 (!) 57 61   Resp:  19 20   Temp: 98.4 F (36.9 C) 98.2 F (36.8 C) 98 F (36.7 C)   TempSrc: Oral Oral Oral   SpO2: 100% 98% 100% 98%  Weight:      Height:        Intake/Output Summary (Last 24 hours) at 07/23/16 1219 Last data filed at 07/23/16 0856  Gross per 24 hour  Intake             2200 ml  Output              425 ml  Net             1775 ml   Filed Weights   07/21/16 0002 07/21/16 0230  Weight: 58.5 kg (128 lb 15.5 oz) 52.2 kg (115 lb 1.3 oz)    Exam  General: Well developed, Cachectic elderly male, no distress  HEENT: NCAT, mucous membranes moist  Cardiovascular: S1 S2 auscultated, RRR, no murmurs  Respiratory: Diminished breath sounds, congested, +cough  Abdomen: Soft, nontender, nondistended, + bowel sounds  Extremities: warm dry without cyanosis clubbing or edema  Neuro: AAOx3, nonfocal  Psych: Flat affect, depressed mood    Data Reviewed: I have personally reviewed following labs and imaging studies  CBC:  Recent Labs Lab 07/20/16 2334 07/21/16 0255 07/22/16 0351  WBC 21.4* 20.5* 16.1*  NEUTROABS 17.9*  --   --   HGB 15.2 15.2 12.1*  HCT 44.2 44.2 36.6*  MCV 89.7 89.7 90.6  PLT 181 165 158   Basic Metabolic Panel:  Recent Labs Lab 07/20/16 2334 07/21/16 0255 07/22/16 0351  NA 140 140 141  K 4.1 3.9 3.5   CL 105 106 112*  CO2 24 23 21*  GLUCOSE 135* 169* 96  BUN 24* 26* 18  CREATININE 1.20 1.12 0.75  CALCIUM 8.9 8.8* 8.6*   GFR: Estimated Creatinine Clearance: 59.8 mL/min (by C-G formula based on SCr of 0.75 mg/dL). Liver Function Tests:  Recent Labs Lab 07/20/16 2334  AST 16  ALT 10*  ALKPHOS 82  BILITOT 0.7  PROT 7.4  ALBUMIN 3.1*   No results for input(s): LIPASE, AMYLASE in the last 168 hours. No results for input(s): AMMONIA in the last 168 hours. Coagulation Profile: No results for input(s): INR, PROTIME in the last 168 hours. Cardiac Enzymes: No results for input(s): CKTOTAL, CKMB, CKMBINDEX, TROPONINI in the last 168 hours. BNP (last 3 results)  No results for input(s): PROBNP in the last 8760 hours. HbA1C: No results for input(s): HGBA1C in the last 72 hours. CBG: No results for input(s): GLUCAP in the last 168 hours. Lipid Profile: No results for input(s): CHOL, HDL, LDLCALC, TRIG, CHOLHDL, LDLDIRECT in the last 72 hours. Thyroid Function Tests: No results for input(s): TSH, T4TOTAL, FREET4, T3FREE, THYROIDAB in the last 72 hours. Anemia Panel: No results for input(s): VITAMINB12, FOLATE, FERRITIN, TIBC, IRON, RETICCTPCT in the last 72 hours. Urine analysis:    Component Value Date/Time   COLORURINE AMBER (A) 07/21/2016 0648   APPEARANCEUR CLOUDY (A) 07/21/2016 0648   LABSPEC 1.020 07/21/2016 0648   PHURINE 7.0 07/21/2016 0648   GLUCOSEU NEGATIVE 07/21/2016 0648   HGBUR SMALL (A) 07/21/2016 0648   BILIRUBINUR NEGATIVE 07/21/2016 0648   KETONESUR NEGATIVE 07/21/2016 0648   PROTEINUR 100 (A) 07/21/2016 0648   NITRITE NEGATIVE 07/21/2016 0648   LEUKOCYTESUR LARGE (A) 07/21/2016 0648   Sepsis Labs: @LABRCNTIP (procalcitonin:4,lacticidven:4)  ) Recent Results (from the past 240 hour(s))  MRSA PCR Screening     Status: None   Collection Time: 07/21/16  2:20 AM  Result Value Ref Range Status   MRSA by PCR NEGATIVE NEGATIVE Final    Comment:        The  GeneXpert MRSA Assay (FDA approved for NASAL specimens only), is one component of a comprehensive MRSA colonization surveillance program. It is not intended to diagnose MRSA infection nor to guide or monitor treatment for MRSA infections.   Culture, blood (routine x 2) Call MD if unable to obtain prior to antibiotics being given     Status: None (Preliminary result)   Collection Time: 07/21/16  2:55 AM  Result Value Ref Range Status   Specimen Description BLOOD RIGHT HAND  Final   Special Requests   Final    BOTTLES DRAWN AEROBIC ONLY Blood Culture adequate volume   Culture   Final    NO GROWTH 1 DAY Performed at [facility], [address] [place], [place] [address]    Report Status PENDING  Incomplete  Culture, blood (routine x 2) Call MD if unable to obtain prior to antibiotics being given     Status: None (Preliminary result)   Collection Time: 07/21/16  2:55 AM  Result Value Ref Range Status   Specimen Description BLOOD LEFT ANTECUBITAL  Final   Special Requests   Final    BOTTLES DRAWN AEROBIC AND ANAEROBIC Blood Culture results may not be optimal due to an excessive volume of blood received in culture bottles   Culture   Final    NO GROWTH 1 DAY Performed at [facility], [address], [place], [place] [address]    Report Status PENDING  Incomplete      Radiology Studies: Dg Swallowing Func-speech Pathology  Result Date: 07/21/2016 Objective Swallowing Evaluation: Type of Study: MBS-Modified Barium Swallow Study Patient Details Name: Kristopher Jennings MRN: [id number] Date of Birth: Sep 16, 1940 Today's Date: 07/21/2016 Time: SLP Start Time (ACUTE ONLY): 1410-SLP Stop Time (ACUTE ONLY): 1450 SLP Time Calculation (min) (ACUTE ONLY): 40 min Past Medical History: Past Medical History: Diagnosis Date . Anxiety  . Arthritis  . Ataxia  . Cauda equina syndrome (HCC)  . Depression  . Dysphagia  . Essential Tremors  . GERD (gastroesophageal reflux disease)  . 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 Parkinson disease (HCC)  . Stroke (HCC)  . Thyroid disease  . UTI (lower urinary tract infection)  Past Surgical History:  Past Surgical History: Procedure Laterality Date . LAMINECTOMY   . SPINE SURGERY   HPI: 76 yo male adm to [facility] with AMS, diagnosed with aspiration pneumonia.  Pt with PMH + for dysphagia, recurrent aspiration pneumonias.  Pt has been on puree/honey diet at facility.  Son states pt is thirsty "all the time".  Swallow evaluation ordered.  Subjective: pt awake in bed, son present Assessment / Plan / Recommendation CHL IP CLINICAL IMPRESSIONS 07/21/2016 Clinical Impression Pt presents with moderately severe oropharyngeal dysphagia with sensorimotor deficits.  Poor lingual and hypopharyngeal motility results in gross residuals that pt does NOT sense.  These residuals are worse with increased viscocity.  Head turn left did not faciliate clearance.  Significant aspiration of nectar present due to decreased laryngeal closure producing weak and delayed reflexive cough.   Trace aspiration of thin mixed with secretions apparent - however much fewer pharyngeal residuals present which will likely decrease pulmonary risk with aspiration.  Following solids with liquids helpful to decrease residuals.  Of note, pt and son state he does NOT desire any feeding tube.  Clear liquid diet may be tolerated best at this time with strict precautions including sitting fully upright, small single boluses, dry swallows, intermittent cough/throat clearing.  Cease intake if pt coughing.  Pt will aspirate regardless of dietary consistency.  Also recommend consider palliative consult due to pt's ongoing dysphagia, recurrent pnas and weight loss. Educated pt/son to recommendations.  SLP to follow up.  SLP Visit Diagnosis Dysphagia, oropharyngeal phase (R13.12) Attention and concentration deficit following -- Frontal lobe and executive function deficit following -- Impact on safety and function Severe  aspiration risk;Risk for inadequate nutrition/hydration   CHL IP TREATMENT RECOMMENDATION 07/21/2016 Treatment Recommendations Defer until completion of intrumental exam   Prognosis 07/21/2016 Prognosis for Safe Diet Advancement Fair Barriers to Reach Goals Time post onset;Severity of deficits Barriers/Prognosis Comment -- CHL IP DIET RECOMMENDATION 07/21/2016 SLP Diet Recommendations (No Data) Liquid Administration via Cup;Straw Medication Administration Crushed with puree Compensations Small sips/bites;Slow rate;Follow solids with liquid Postural Changes Remain semi-upright after after feeds/meals (Comment);Seated upright at 90 degrees   CHL IP OTHER RECOMMENDATIONS 03/20/2016 Recommended Consults -- Oral Care Recommendations Oral care BID Other Recommendations Order thickener from pharmacy;Have oral suction available;Remove water pitcher;Prohibited food (jello, ice cream, thin soups)   CHL IP FOLLOW UP RECOMMENDATIONS 07/21/2016 Follow up Recommendations (No Data)   CHL IP FREQUENCY AND DURATION 07/21/2016 Speech Therapy Frequency (ACUTE ONLY) min 1 x/week Treatment Duration 1 week      CHL IP ORAL PHASE 07/21/2016 Oral Phase Impaired Oral - Pudding Teaspoon -- Oral - Pudding Cup -- Oral - Honey Teaspoon Delayed oral transit;Decreased bolus cohesion;Reduced posterior propulsion;Weak lingual manipulation Oral - Honey Cup Reduced posterior propulsion;Delayed oral transit;Weak lingual manipulation;Decreased bolus cohesion Oral - Nectar Teaspoon Delayed oral transit;Reduced posterior propulsion;Weak lingual manipulation Oral - Nectar Cup Weak lingual manipulation;Reduced posterior propulsion;Delayed oral transit Oral - Nectar Straw Weak lingual manipulation;Reduced posterior propulsion;Delayed oral transit Oral - Thin Teaspoon Weak lingual manipulation;Delayed oral transit;Reduced posterior propulsion Oral - Thin Cup Weak lingual manipulation;Delayed oral transit;Reduced posterior propulsion Oral - Thin Straw Weak lingual  manipulation;Reduced posterior propulsion;Delayed oral transit Oral - Puree Weak lingual manipulation;Delayed oral transit;Decreased bolus cohesion;Reduced posterior propulsion Oral - Mech Soft Weak lingual manipulation;Decreased bolus cohesion;Impaired mastication;Reduced posterior propulsion;Delayed oral transit Oral - Regular -- Oral - Multi-Consistency -- Oral - Pill -- Oral Phase - Comment --  CHL IP PHARYNGEAL PHASE 07/21/2016 Pharyngeal Phase Impaired Pharyngeal- Pudding Teaspoon -- Pharyngeal --  Pharyngeal- Pudding Cup -- Pharyngeal -- Pharyngeal- Honey Teaspoon Reduced pharyngeal peristalsis;Reduced epiglottic inversion;Reduced anterior laryngeal mobility;Reduced laryngeal elevation;Reduced airway/laryngeal closure;Reduced tongue base retraction;Pharyngeal residue - valleculae Pharyngeal -- Pharyngeal- Honey Cup Reduced pharyngeal peristalsis;Reduced epiglottic inversion;Reduced anterior laryngeal mobility;Reduced laryngeal elevation;Reduced airway/laryngeal closure;Reduced tongue base retraction;Pharyngeal residue - valleculae;Pharyngeal residue - pyriform Pharyngeal -- Pharyngeal- Nectar Teaspoon Reduced pharyngeal peristalsis;Reduced epiglottic inversion;Reduced anterior laryngeal mobility;Reduced laryngeal elevation;Reduced airway/laryngeal closure;Reduced tongue base retraction;Pharyngeal residue - valleculae;Pharyngeal residue - pyriform;Penetration/Aspiration during swallow Pharyngeal Material enters airway, remains ABOVE vocal cords and not ejected out Pharyngeal- Nectar Cup Delayed swallow initiation-vallecula;Reduced pharyngeal peristalsis;Reduced epiglottic inversion;Reduced anterior laryngeal mobility;Reduced laryngeal elevation;Reduced airway/laryngeal closure;Reduced tongue base retraction;Penetration/Aspiration during swallow;Significant aspiration (Amount);Pharyngeal residue - valleculae Pharyngeal Material enters airway, passes BELOW cords and not ejected out despite cough attempt by  patient;Material enters airway, passes BELOW cords without attempt by patient to eject out (silent aspiration) Pharyngeal- Nectar Straw Delayed swallow initiation-vallecula;Reduced pharyngeal peristalsis;Reduced epiglottic inversion;Reduced anterior laryngeal mobility;Reduced laryngeal elevation;Reduced tongue base retraction;Penetration/Aspiration during swallow;Pharyngeal residue - valleculae Pharyngeal Material enters airway, CONTACTS cords and not ejected out;Material enters airway, passes BELOW cords then ejected out Pharyngeal- Thin Teaspoon Penetration/Aspiration during swallow;Reduced pharyngeal peristalsis;Reduced epiglottic inversion;Reduced anterior laryngeal mobility;Reduced laryngeal elevation;Reduced airway/laryngeal closure;Reduced tongue base retraction;Pharyngeal residue - valleculae Pharyngeal Material enters airway, remains ABOVE vocal cords and not ejected out Pharyngeal- Thin Cup Reduced pharyngeal peristalsis;Reduced epiglottic inversion;Reduced anterior laryngeal mobility;Reduced laryngeal elevation;Reduced airway/laryngeal closure;Reduced tongue base retraction;Penetration/Aspiration during swallow;Pharyngeal residue - valleculae;Pharyngeal residue - pyriform;Trace aspiration Pharyngeal Material enters airway, CONTACTS cords and then ejected out Pharyngeal- Thin Straw Reduced pharyngeal peristalsis;Reduced epiglottic inversion;Reduced anterior laryngeal mobility;Reduced laryngeal elevation;Reduced airway/laryngeal closure;Reduced tongue base retraction;Pharyngeal residue - valleculae;Penetration/Aspiration during swallow Pharyngeal Material enters airway, passes BELOW cords without attempt by patient to eject out (silent aspiration) Pharyngeal- Puree Reduced tongue base retraction;Pharyngeal residue - valleculae;Reduced airway/laryngeal closure;Reduced pharyngeal peristalsis;Reduced epiglottic inversion Pharyngeal -- Pharyngeal- Mechanical Soft Reduced tongue base retraction;Pharyngeal  residue - valleculae;Reduced pharyngeal peristalsis;Reduced epiglottic inversion Pharyngeal -- Pharyngeal- Regular -- Pharyngeal -- Pharyngeal- Multi-consistency -- Pharyngeal -- Pharyngeal- Pill -- Pharyngeal -- Pharyngeal Comment --    Kristopher Burnet, MS [facility] SLP [phone]           Scheduled Meds: . chlorhexidine  15 mL Mouth Rinse BID  . enoxaparin (LOVENOX) injection  40 mg Subcutaneous Q24H  . levothyroxine  85 mcg Intravenous Daily  . mouth rinse  15 mL Mouth Rinse q12n4p   Continuous Infusions: . sodium chloride 100 mL/hr at 07/22/16 1602  . ampicillin-sulbactam (UNASYN) IV Stopped (07/23/16 0535)     LOS: 2 days   Time Spent in minutes   30 minutes  Kristopher Jennings D.O. on 07/23/2016 at 12:19 PM  Between 7am to 7pm - Pager - [phone]  After 7pm go to [email or web address] - password [email or web address]  And look for the night coverage person covering for me after hours  [facility] Office  [phone]

## 2016-07-23 NOTE — Consult Note (Signed)
Consultation Note Date: 07/23/2016   Patient Name: Kristopher Jennings  DOB: 04/30/1940  MRN: [id number]  Age / Sex: 76 y.o., male  PCP: Renford Dills, MD Referring Physician: Edsel Petrin, DO  Reason for Consultation: Establishing goals of care  HPI/Patient Profile: 76 y.o. male    admitted on 07/20/2016    Clinical Assessment and Goals of Care:  Kristopher Jennings a 76 y.o.gentleman with a history of Parkinson's disease with cognitive impairment, prior CVA, essential tremor, dysphagia, and previous admission in January 2018 for sepsis secondary to aspiration pneumonia.   The patient is now admitted to the hospital since 07-20-16 with sepsis secondary to PNA, he has been seen by speech therapy. A palliative consult has been requested for additional GOC discussions.   The patient is resting in bed, he has flat affect, does not verbalize much, answers appropriately in mono syllables. Son is at the bedside. We discussed about the patient's current condition and possible decline trajectory that places him at high risk for dying, prognosis likely less than 6 months, although could be much shorter, should the patient have an acute aspiration event. We discussed about goals and wishes and what would be important the patient. Unfortunately, the patient did not share much. Hence, we continued the discussions to have hospice follow the patient at [facility]. We discussed about how they can be an extra layer of support. Son and patient are agreeable.  See recommendations below. Thank you for the consult.     NEXT OF KIN  son is next of kin.   SUMMARY OF RECOMMENDATIONS    Agree with DNR DNI Agree with current safest possible diet SNF with hospice on D/C. Agree with Dr Cristopher Estimable impressions that patient remains at high risk for ongoing aspiration, lung infection and recurrent hospitalization,continue conversations  with son and patient about avoiding hospitalizations and pursuing hospice philosophy of care directed more towards comfort measures in the future.  Thank you for the consult.   Code Status/Advance Care Planning:  DNR    Symptom Management:      Palliative Prophylaxis:   Delirium Protocol  Psycho-social/Spiritual:   Desire for further Chaplaincy support:no  Additional Recommendations: Education on Hospice  Prognosis:   < 6 months  Discharge Planning: Skilled Nursing Facility with Hospice      Primary Diagnoses: Present on Admission: . HCAP (healthcare-associated pneumonia) . Benign essential tremor . Hypothyroidism . Essential hypertension, benign . Sepsis, unspecified organism (HCC)   I have reviewed the medical record, interviewed the patient and family, and examined the patient. The following aspects are pertinent.  Past Medical History:  Diagnosis Date  . Anxiety   . Arthritis   . Ataxia   . Cauda equina syndrome (HCC)   . Depression   . Dysphagia   . Essential Tremors   . GERD (gastroesophageal reflux disease)   . Paralysis agitans (HCC) 01/12/2013  . Parkinson disease (HCC)   . Stroke (HCC)   . Thyroid disease   . UTI (lower urinary tract infection)  Social History   Social History  . Marital status: Divorced    Spouse name: N/A  . Number of children: N/A  . Years of education: N/A   Social History Main Topics  . Smoking status: Former Games developermoker  . Smokeless tobacco: Never Used  . Alcohol use No  . Drug use: No  . Sexual activity: No   Other Topics Concern  . None   Social History Narrative  . None   No family history on file. Scheduled Meds: . [START ON 07/24/2016] buPROPion  200 mg Oral Daily  . chlorhexidine  15 mL Mouth Rinse BID  . cholecalciferol  2,000 Units Oral Daily  . clonazePAM  0.5 mg Oral Daily  . Dextromethorphan-Quinidine  1 capsule Oral BID  . enoxaparin (LOVENOX) injection  40 mg Subcutaneous Q24H  . [START ON  07/24/2016] levothyroxine  175 mcg Oral QAC breakfast  . mouth rinse  15 mL Mouth Rinse q12n4p  . QUEtiapine  50 mg Oral BID  . tamsulosin  0.8 mg Oral QHS   Continuous Infusions: . sodium chloride 100 mL/hr at 07/22/16 1602  . ampicillin-sulbactam (UNASYN) IV Stopped (07/23/16 0535)   PRN Meds:.acetaminophen, albuterol, morphine injection Medications Prior to Admission:  Prior to Admission medications   Medication Sig Start Date End Date Taking? Authorizing Provider  acetaminophen (TYLENOL) 325 MG tablet Take 650 mg by mouth every 4 (four) hours as needed for mild pain, moderate pain, fever or headache.    Yes Historical Provider, MD  Amino Acids-Protein Hydrolys (FEEDING SUPPLEMENT, PRO-STAT SUGAR FREE 64,) LIQD Take 30 mLs by mouth daily.   Yes Historical Provider, MD  buPROPion (WELLBUTRIN SR) 200 MG 12 hr tablet Take 200 mg by mouth daily before breakfast.    Yes Historical Provider, MD  Cholecalciferol (VITAMIN D) 2000 units CAPS Take 2,000 Units by mouth daily.    Yes Historical Provider, MD  clonazePAM (KLONOPIN) 0.5 MG tablet Take 0.5 mg by mouth daily.   Yes Historical Provider, MD  Dextromethorphan-Quinidine (NUEDEXTA) 20-10 MG CAPS Take 1 capsule by mouth 2 (two) times daily.   Yes Historical Provider, MD  levothyroxine (SYNTHROID, LEVOTHROID) 175 MCG tablet Take 175 mcg by mouth daily before breakfast.   Yes Historical Provider, MD  Melatonin 3 MG TABS Take 3 mg by mouth at bedtime.   Yes Historical Provider, MD  QUEtiapine (SEROQUEL) 50 MG tablet Take 50 mg by mouth 2 (two) times daily.   Yes Historical Provider, MD  senna (SENOKOT) 8.6 MG TABS tablet Take 1 tablet by mouth at bedtime.   Yes Historical Provider, MD  tamsulosin (FLOMAX) 0.4 MG CAPS capsule Take 0.8 mg by mouth at bedtime.    Yes Historical Provider, MD   Allergies  Allergen Reactions  . Demerol [Meperidine] Other (See Comments)    Reaction:  Unknown   . Lactose Intolerance (Gi) Other (See Comments)     Reaction:  Unknown   . Nickel Other (See Comments)    Reaction:  Unknown   . Oxycodone Other (See Comments)    Reaction:  Unknown    Review of Systems +weakness +flat affect  Physical Exam Elderly cachectic gentleman Flat affect Does not verbalize much In no distress S1 S2 +congestion anterior lung fields Abdomen soft No edema  Vital Signs: BP (!) 110/59 (BP Location: Left Arm)   Pulse 61   Temp 98 F (36.7 C) (Oral)   Resp 20   Ht 5\' 9"  (1.753 m)   Wt 52.2 kg (115 lb  1.3 oz)   SpO2 98%   BMI 16.99 kg/m  Pain Assessment: 0-10   Pain Score: 4    SpO2: SpO2: 98 % O2 Device:SpO2: 98 % O2 Flow Rate: .O2 Flow Rate (L/min): 2 L/min  IO: Intake/output summary:  Intake/Output Summary (Last 24 hours) at 07/23/16 1323 Last data filed at 07/23/16 0856  Gross per 24 hour  Intake             2150 ml  Output              425 ml  Net             1725 ml    LBM: Last BM Date: 07/22/16 Baseline Weight: Weight: 58.5 kg (128 lb 15.5 oz) Most recent weight: Weight: 52.2 kg (115 lb 1.3 oz)     Palliative Assessment/Data:   Flowsheet Rows     Most Recent Value  Intake Tab  Referral Department  Hospitalist  Unit at Time of Referral  Med/Surg Unit  Palliative Care Primary Diagnosis  Other (Comment)  Date Notified  07/21/16  Palliative Care Type  Return patient Palliative Care  Reason for referral  Clarify Goals of Care  Date of Admission  07/20/16  Date first seen by Palliative Care  07/23/16  # of days Palliative referral response time  2 Day(s)  # of days IP prior to Palliative referral  1  Clinical Assessment  Palliative Performance Scale Score  30%  Pain Max last 24 hours  4  Pain Min Last 24 hours  3  Dyspnea Max Last 24 Hours  4  Dyspnea Min Last 24 hours  3  Nausea Max Last 24 Hours  4  Nausea Min Last 24 Hours  3  Psychosocial & Spiritual Assessment  Palliative Care Outcomes  Patient/Family meeting held?  Yes  Who was at the meeting?  patient and son.     Palliative Care Outcomes  Clarified goals of care      Time In:  9 Time Out:10   Time Total: 60 min   Greater than 50%  of this time was spent counseling and coordinating care related to the above assessment and plan.  Signed by: Rosalin Hawking, MD  [phone]  Please contact Palliative Medicine Team phone at [phone] for questions and concerns.  For individual provider: See Loretha Stapler

## 2016-07-23 NOTE — [place] FL2 (Signed)
[place] MEDICAID FL2 LEVEL OF CARE SCREENING TOOL     IDENTIFICATION  Patient Name: Kristopher Jennings Birthdate: 1941/03/12 Sex: male Admission Date (Current Location): 07/20/2016  [place] and [place] Number:  [address] Bast [id number] S Facility and Address:  [facility],  [address] [place] [address]      Provider Number: [id number]  Attending Physician Name and Address:  Edsel Petrin, DO  Relative Name and Phone Number:       Current Level of Care: Hospital Recommended Level of Care: Skilled Nursing Facility Prior Approval Number:    Date Approved/Denied:   PASRR Number: [id number]  Discharge Plan: SNF    Current Diagnoses: Patient Active Problem List   Diagnosis Date Noted  . Protein-calorie malnutrition, severe 07/23/2016  . HCAP (healthcare-associated pneumonia) 07/21/2016  . Encounter for palliative care   . Goals of care, counseling/discussion   . Pressure injury of skin 03/20/2016  . Lobar pneumonia (HCC) 03/19/2016  . Sepsis, unspecified organism (HCC) 03/19/2016  . Acute encephalopathy 03/19/2016  . Aspiration pneumonia (HCC) 03/19/2016  . BPH (benign prostatic hyperplasia) 01/01/2014  . Thyroid activity decreased 10/04/2013  . Fever, unspecified 10/03/2013  . Unspecified constipation 10/02/2013  . Other specified disease of white blood cells 10/02/2013  . Cough 05/03/2013  . Urinary tract infection, site not specified 02/26/2013  . Urinary hesitancy 02/19/2013  . Parkinson's disease (HCC) 01/12/2013  . Essential hypertension, benign 01/08/2013  . Hypertrophy of prostate without urinary obstruction and other lower urinary tract symptoms (LUTS) 01/08/2013  . Anxiety state, unspecified 01/08/2013  . Benign essential tremor 12/13/2012  . H/O urinary retention 12/13/2012  . Hypothyroidism 12/13/2012  . Insomnia 12/13/2012    Orientation RESPIRATION BLADDER Height & Weight     Self, Time, Situation, Place  O2 (2L) Incontinent,  Indwelling catheter Weight: 115 lb 1.3 oz (52.2 kg) Height:  5\' 9"  (175.3 cm)  BEHAVIORAL SYMPTOMS/MOOD NEUROLOGICAL BOWEL NUTRITION STATUS      Incontinent Diet (See d/c summary )  AMBULATORY STATUS COMMUNICATION OF NEEDS Skin     Verbally Normal                       Personal Care Assistance Level of Assistance  Bathing, Feeding, Dressing Bathing Assistance: Maximum assistance Feeding assistance: Maximum assistance Dressing Assistance: Maximum assistance     Functional Limitations Info  Sight, Hearing, Speech Sight Info: Adequate Hearing Info: Adequate Speech Info: Adequate    SPECIAL CARE FACTORS FREQUENCY  PT (By licensed PT), OT (By licensed OT), Speech therapy                    Contractures Contractures Info: Not present    Additional Factors Info  Code Status, Allergies Code Status Info: DNR Allergies Info: Demerol Meperidine, Lactose Intolerance (Gi), Nickel, Oxycodone           Current Medications (07/23/2016):  This is the current hospital active medication list Current Facility-Administered Medications  Medication Dose Route Frequency Provider Last Rate Last Dose  . 0.9 %  sodium chloride infusion   Intravenous Continuous Michael Litter, MD 100 mL/hr at 07/22/16 1602    . acetaminophen (TYLENOL) suppository 650 mg  650 mg Rectal Q4H PRN Maryann Mikhail, DO   650 mg at 07/21/16 0933  . albuterol (PROVENTIL) (2.5 MG/3ML) 0.083% nebulizer solution 2.5 mg  2.5 mg Nebulization Q4H PRN Michael Litter, MD      . ampicillin-sulbactam (UNASYN) 1.5 g in sodium chloride 0.9 % 50 mL IVPB  1.5 g Intravenous Q8H Teressa Lowermanda M [place], [place]   Stopped at 07/23/16 0535  . chlorhexidine (PERIDEX) 0.12 % solution 15 mL  15 mL Mouth Rinse BID Michael LitterNikki Carter, MD   15 mL at 07/22/16 2213  . enoxaparin (LOVENOX) injection 40 mg  40 mg Subcutaneous Q24H Michael LitterNikki Carter, MD   40 mg at 07/22/16 0955  . levothyroxine (SYNTHROID, LEVOTHROID) injection 85 mcg  85 mcg Intravenous Daily Michael LitterNikki  Carter, MD   85 mcg at 07/22/16 0951  . MEDLINE mouth rinse  15 mL Mouth Rinse q12n4p Michael LitterNikki Carter, MD   15 mL at 07/22/16 1604  . morphine 4 MG/ML injection 1 mg  1 mg Intravenous Q4H PRN Maryann Mikhail, DO   1 mg at 07/23/16 [id number]     Discharge Medications: Please see discharge summary for a list of discharge medications.  Relevant Imaging Results:  Relevant Lab Results:   Additional Information ssn#[id number]  Clearance CootsNicole A Tashona Calk, LCSW

## 2016-07-24 LAB — BASIC METABOLIC PANEL
Anion gap: 8 (ref 5–15)
BUN: 5 mg/dL — AB (ref 6–20)
CHLORIDE: 112 mmol/L — AB (ref 101–111)
CO2: 22 mmol/L (ref 22–32)
CREATININE: 0.7 mg/dL (ref 0.61–1.24)
Calcium: 8.1 mg/dL — ABNORMAL LOW (ref 8.9–10.3)
Glucose, Bld: 76 mg/dL (ref 65–99)
Potassium: 3.4 mmol/L — ABNORMAL LOW (ref 3.5–5.1)
SODIUM: 142 mmol/L (ref 135–145)

## 2016-07-24 LAB — CBC
HEMATOCRIT: 34.6 % — AB (ref 39.0–52.0)
Hemoglobin: 11.7 g/dL — ABNORMAL LOW (ref 13.0–17.0)
MCH: 29.9 pg (ref 26.0–34.0)
MCHC: 33.8 g/dL (ref 30.0–36.0)
MCV: 88.5 fL (ref 78.0–100.0)
Platelets: 164 10*3/uL (ref 150–400)
RBC: 3.91 MIL/uL — ABNORMAL LOW (ref 4.22–5.81)
RDW: 12.9 % (ref 11.5–15.5)
WBC: 13.8 10*3/uL — ABNORMAL HIGH (ref 4.0–10.5)

## 2016-07-24 MED ORDER — QUETIAPINE FUMARATE 50 MG PO TABS: 50.0000 mg | ORAL_TABLET | Freq: Two times a day (BID) | ORAL | 0 refills | Status: DC

## 2016-07-24 MED ORDER — POTASSIUM CHLORIDE 20 MEQ/15ML (10%) PO SOLN
40.0000 meq | Freq: Every day | ORAL | Status: DC
  Administered 2016-07-24: 40 meq via ORAL
  Filled 2016-07-24: qty 30

## 2016-07-24 MED ORDER — AMOXICILLIN-POT CLAVULANATE 400-57 MG/5ML PO SUSR: 400.0000 mg | Freq: Two times a day (BID) | ORAL | 0 refills | Status: DC

## 2016-07-24 MED ORDER — CLONAZEPAM 0.5 MG PO TABS: 0.5000 mg | ORAL_TABLET | Freq: Every day | ORAL | 0 refills | Status: DC

## 2016-07-24 NOTE — Progress Notes (Signed)
Writer gave report to receiving nurse, Lawanna KobusAngel, [facility] at [facility]. [facility] has been called for transport.

## 2016-07-24 NOTE — Discharge Summary (Signed)
Physician Discharge Summary  Kristopher Jennings ZOX:[id number] DOB: 21-Oct-1940 DOA: 07/20/2016  PCP: Renford Dills, MD  Admit date: 07/20/2016 Discharge date: 07/24/2016  Time spent: 45 minutes  Recommendations for Outpatient Follow-up:  Patient will be discharged to skilled nursing facility with hospice to follow.  Patient will need to follow up with primary care provider within one week of discharge, repeat BMP.  Patient should continue medications as prescribed.  Patient should follow a clear liquid diet and advance as tolerated.   Discharge Diagnoses:  Sepsis secondary to HCAP versus aspiration pneumonia Hypothyroidism Dysphagia  Essential Tremors/Parkinson's  Depression Asymptomatic bacteriuria  Severe Malnutrition Goals of care  Discharge Condition: Stable  Diet recommendation: clear liquid  Filed Weights   07/21/16 0002 07/21/16 0230  Weight: 58.5 kg (128 lb 15.5 oz) 52.2 kg (115 lb 1.3 oz)    History of present illness:  on 07/21/2016 by Dr. Court Joy Childressis a 76 y.o.gentleman with a history of Parkinson's disease with cognitive impairment, prior CVA, essential tremor, dysphagia, and previous admission in January 2018 for sepsis secondary to aspiration pneumonia. Reportedly, he developed fever today and an outpatient chest xray was obtained, which showed an infiltrate. The patient received IM Rocephin before being transferred to the ED for further evaluation and treatment. Upon arrival in the ED, he had mild hypoxia, corrected with 2L [place]. He has a harsh wet cough. The patient says that he coughs every night. He denies chest pain. He has had nausea; he cannot tell me if he has vomited. It is not clear if an overt aspiration event was noted at his facility.n He cannot tell me if he has had syncope or recent fall.  Hospital Course:  Sepsis secondary to HCAP versus aspiration pneumonia -Upon admission, patient was febrile with leukocytosis, tachypnea,  tachycardia -Sepsis morphology improving  -Chest x-ray showed right base patchy airspace opacity recommended follow-up PA and lateral chest x-ray in 3-4 weeks -Blood cultures show no growth to date -Speech consulted and recommended clear liquid with aspiration risks/mitigation strategies  -Patient has had a history of dysphagia -Continue aspiration precautions -Continue nebulizers, oral care -Initially placed on vancomycin, cefepime, flagyl. Transitioned to IV unasyn, will discharge with Augmentin -Continue flutter valve  Hypothyroidism -Was placed on IV Synthroid due to NPO status -Currently on PO synthroid  Dysphagia  -Secondary to history of CVA/Deep brain stimulator  Essential Tremors/Parkinson's  -Has had deep brain stimulator placed -Spoke with son, patient's tremors are not related to Parkinson's -Continue Nuedexta   Depression -Continue Wellbutrin/seroquel  Asymptomatic bacteriuria  -patient does self cath -UA: No bacteria, TNTC WBC, large leukocytes   Severe Malnutrition -Likely secondary to dysphagia -Nutrition consulted  Hypokalemia -Replaced, recheck BMP in one week  Goals of care -Had discussion with son via phone. Patient has been depressed lately and has been dealing with dysphagia for some time after his CVA after deep brain stimulator. Son feels patient is giving up. Would like palliative care consulted and to discuss options of hospice. -Currently DNR (confirmed with son) -MOST form -Palliative care consulted and appreciated -patient will be discharged back to SNF with hospice   Consultants Palliative care  Procedures  None   Discharge Exam: Vitals:   07/23/16 2150 07/24/16 0641  BP: (!) 124/58 115/62  Pulse: 80 74  Resp: 20 19  Temp: 99 F (37.2 C) 97.5 F (36.4 C)   Patient states he is feeling better. Still unable to get clear or cough up sputum. Denies chest pain, abdominal pain, N/V.  Still feels weak.   General: Well  developed, cachetic, no apparent distress  HEENT: NCAT,  mucous membranes moist.  Cardiovascular: S1 S2 auscultated, RRR  Respiratory: Diminished breath sounds (anteriorly)  Abdomen: Soft, nontender, nondistended, + bowel sounds  Extremities: warm dry without cyanosis clubbing or edema  Neuro: AAOx3, nonfocal  Psych: Normal affect and demeanor  Discharge Instructions Discharge Instructions    Discharge instructions    Complete by:  As directed    Patient will be discharged to skilled nursing facility with hospice to follow.  Patient will need to follow up with primary care provider within one week of discharge.  Patient should continue medications as prescribed.  Patient should follow a clear liquid diet and advance as tolerated.     Current Discharge Medication List    START taking these medications   Details  amoxicillin-clavulanate (AUGMENTIN) 400-57 MG/5ML suspension Take 5 mLs (400 mg total) by mouth 2 (two) times daily. Qty: 100 mL, Refills: 0      CONTINUE these medications which have NOT CHANGED   Details  acetaminophen (TYLENOL) 325 MG tablet Take 650 mg by mouth every 4 (four) hours as needed for mild pain, moderate pain, fever or headache.     Amino Acids-Protein Hydrolys (FEEDING SUPPLEMENT, PRO-STAT SUGAR FREE 64,) LIQD Take 30 mLs by mouth daily.    buPROPion (WELLBUTRIN SR) 200 MG 12 hr tablet Take 200 mg by mouth daily before breakfast.     Cholecalciferol (VITAMIN D) 2000 units CAPS Take 2,000 Units by mouth daily.     clonazePAM (KLONOPIN) 0.5 MG tablet Take 0.5 mg by mouth daily.    Dextromethorphan-Quinidine (NUEDEXTA) 20-10 MG CAPS Take 1 capsule by mouth 2 (two) times daily.    levothyroxine (SYNTHROID, LEVOTHROID) 175 MCG tablet Take 175 mcg by mouth daily before breakfast.    Melatonin 3 MG TABS Take 3 mg by mouth at bedtime.    QUEtiapine (SEROQUEL) 50 MG tablet Take 50 mg by mouth 2 (two) times daily.    senna (SENOKOT) 8.6 MG TABS tablet  Take 1 tablet by mouth at bedtime.    tamsulosin (FLOMAX) 0.4 MG CAPS capsule Take 0.8 mg by mouth at bedtime.        Allergies  Allergen Reactions  . Demerol [Meperidine] Other (See Comments)    Reaction:  Unknown   . Lactose Intolerance (Gi) Other (See Comments)    Reaction:  Unknown   . Nickel Other (See Comments)    Reaction:  Unknown   . Oxycodone Other (See Comments)    Reaction:  Unknown    Follow-up Information    Polite, Ronald, MD. Schedule an appointment as soon as possible for a visit in 1 week(s).   Specialty:  Internal Medicine Why:  Hospital follow up Contact information: [address] [place] [address] [phone]            The results of significant diagnostics from this hospitalization (including imaging, microbiology, ancillary and laboratory) are listed below for reference.    Significant Diagnostic Studies: Dg Chest 2 View  Result Date: 07/21/2016 CLINICAL DATA:  Fever this and pain. EXAM: CHEST  2 VIEW COMPARISON:  03/19/2016 FINDINGS: Patchy right base opacity may represent pneumonia. No effusion. Left lung is clear. Normal heart size. IMPRESSION: Right base patchy airspace opacity, possibly pneumonia. Followup PA and lateral chest X-ray is recommended in 3-4 weeks following trial of antibiotic therapy to ensure resolution and exclude underlying malignancy. Electronically Signed   By:  Ellery Plunkaniel R Mitchell M.D.   On: 07/21/2016 00:03   Dg Swallowing Func-speech Pathology  Result Date: 07/21/2016 Objective Swallowing Evaluation: Type of Study: MBS-Modified Barium Swallow Study Patient Details Name: Barb MerinoRussell Luellen MRN: [id number] Date of Birth: [birth year] Today's Date: 07/21/2016 Time: SLP Start Time (ACUTE ONLY): 1410-SLP Stop Time (ACUTE ONLY): 1450 SLP Time Calculation (min) (ACUTE ONLY): 40 min Past Medical History: Past Medical History: Diagnosis Date . Anxiety  . Arthritis  . Ataxia  . Cauda equina syndrome (HCC)  . Depression  .  Dysphagia  . Essential Tremors  . GERD (gastroesophageal reflux disease)  . Paralysis agitans (HCC) 01/12/2013 . Parkinson disease (HCC)  . Stroke (HCC)  . Thyroid disease  . UTI (lower urinary tract infection)  Past Surgical History: Past Surgical History: Procedure Laterality Date . LAMINECTOMY   . SPINE SURGERY   HPI: 76 yo male [facility] with [facility], diagnosed with aspiration pneumonia.  Pt with PMH + for dysphagia, recurrent aspiration pneumonias.  Pt has been on puree/honey diet at facility.  Son states pt is thirsty "all the time".  Swallow evaluation ordered.  Subjective: pt awake in bed, son present Assessment / Plan / Recommendation CHL IP CLINICAL IMPRESSIONS 07/21/2016 Clinical Impression Pt presents with moderately severe oropharyngeal dysphagia with sensorimotor deficits.  Poor lingual and hypopharyngeal motility results in gross residuals that pt does NOT sense.  These residuals are worse with increased viscocity.  Head turn left did not faciliate clearance.  Significant aspiration of nectar present due to decreased laryngeal closure producing weak and delayed reflexive cough.   Trace aspiration of thin mixed with secretions apparent - however much fewer pharyngeal residuals present which will likely decrease pulmonary risk with aspiration.  Following solids with liquids helpful to decrease residuals.  Of note, pt and son state he does NOT desire any feeding tube.  Clear liquid diet may be tolerated best at this time with strict precautions including sitting fully upright, small single boluses, dry swallows, intermittent cough/throat clearing.  Cease intake if pt coughing.  Pt will aspirate regardless of dietary consistency.  Also recommend consider palliative consult due to pt's ongoing dysphagia, recurrent pnas and weight loss. Educated pt/son to recommendations.  SLP to follow up.  SLP Visit Diagnosis Dysphagia, oropharyngeal phase (R13.12) Attention and concentration deficit following -- Frontal lobe  and executive function deficit following -- Impact on safety and function Severe aspiration risk;Risk for inadequate nutrition/hydration   CHL IP TREATMENT RECOMMENDATION 07/21/2016 Treatment Recommendations Defer until completion of intrumental exam   Prognosis 07/21/2016 Prognosis for Safe Diet Advancement Fair Barriers to Reach Goals Time post onset;Severity of deficits Barriers/Prognosis Comment -- CHL IP DIET RECOMMENDATION 07/21/2016 SLP Diet Recommendations (No Data) Liquid Administration via Cup;Straw Medication Administration Crushed with puree Compensations Small sips/bites;Slow rate;Follow solids with liquid Postural Changes Remain semi-upright after after feeds/meals (Comment);Seated upright at 90 degrees   CHL IP OTHER RECOMMENDATIONS 03/20/2016 Recommended Consults -- Oral Care Recommendations Oral care BID Other Recommendations Order thickener from pharmacy;Have oral suction available;Remove water pitcher;Prohibited food (jello, ice cream, thin soups)   CHL IP FOLLOW UP RECOMMENDATIONS 07/21/2016 Follow up Recommendations (No Data)   CHL IP FREQUENCY AND DURATION 07/21/2016 Speech Therapy Frequency (ACUTE ONLY) min 1 x/week Treatment Duration 1 week      CHL IP ORAL PHASE 07/21/2016 Oral Phase Impaired Oral - Pudding Teaspoon -- Oral - Pudding Cup -- Oral - Honey Teaspoon Delayed oral transit;Decreased bolus cohesion;Reduced posterior propulsion;Weak lingual manipulation Oral - Honey Cup Reduced posterior propulsion;Delayed  oral transit;Weak lingual manipulation;Decreased bolus cohesion Oral - Nectar Teaspoon Delayed oral transit;Reduced posterior propulsion;Weak lingual manipulation Oral - Nectar Cup Weak lingual manipulation;Reduced posterior propulsion;Delayed oral transit Oral - Nectar Straw Weak lingual manipulation;Reduced posterior propulsion;Delayed oral transit Oral - Thin Teaspoon Weak lingual manipulation;Delayed oral transit;Reduced posterior propulsion Oral - Thin Cup Weak lingual  manipulation;Delayed oral transit;Reduced posterior propulsion Oral - Thin Straw Weak lingual manipulation;Reduced posterior propulsion;Delayed oral transit Oral - Puree Weak lingual manipulation;Delayed oral transit;Decreased bolus cohesion;Reduced posterior propulsion Oral - Mech Soft Weak lingual manipulation;Decreased bolus cohesion;Impaired mastication;Reduced posterior propulsion;Delayed oral transit Oral - Regular -- Oral - Multi-Consistency -- Oral - Pill -- Oral Phase - Comment --  CHL IP PHARYNGEAL PHASE 07/21/2016 Pharyngeal Phase Impaired Pharyngeal- Pudding Teaspoon -- Pharyngeal -- Pharyngeal- Pudding Cup -- Pharyngeal -- Pharyngeal- Honey Teaspoon Reduced pharyngeal peristalsis;Reduced epiglottic inversion;Reduced anterior laryngeal mobility;Reduced laryngeal elevation;Reduced airway/laryngeal closure;Reduced tongue base retraction;Pharyngeal residue - valleculae Pharyngeal -- Pharyngeal- Honey Cup Reduced pharyngeal peristalsis;Reduced epiglottic inversion;Reduced anterior laryngeal mobility;Reduced laryngeal elevation;Reduced airway/laryngeal closure;Reduced tongue base retraction;Pharyngeal residue - valleculae;Pharyngeal residue - pyriform Pharyngeal -- Pharyngeal- Nectar Teaspoon Reduced pharyngeal peristalsis;Reduced epiglottic inversion;Reduced anterior laryngeal mobility;Reduced laryngeal elevation;Reduced airway/laryngeal closure;Reduced tongue base retraction;Pharyngeal residue - valleculae;Pharyngeal residue - pyriform;Penetration/Aspiration during swallow Pharyngeal Material enters airway, remains ABOVE vocal cords and not ejected out Pharyngeal- Nectar Cup Delayed swallow initiation-vallecula;Reduced pharyngeal peristalsis;Reduced epiglottic inversion;Reduced anterior laryngeal mobility;Reduced laryngeal elevation;Reduced airway/laryngeal closure;Reduced tongue base retraction;Penetration/Aspiration during swallow;Significant aspiration (Amount);Pharyngeal residue - valleculae Pharyngeal  Material enters airway, passes BELOW cords and not ejected out despite cough attempt by patient;Material enters airway, passes BELOW cords without attempt by patient to eject out (silent aspiration) Pharyngeal- Nectar Straw Delayed swallow initiation-vallecula;Reduced pharyngeal peristalsis;Reduced epiglottic inversion;Reduced anterior laryngeal mobility;Reduced laryngeal elevation;Reduced tongue base retraction;Penetration/Aspiration during swallow;Pharyngeal residue - valleculae Pharyngeal Material enters airway, CONTACTS cords and not ejected out;Material enters airway, passes BELOW cords then ejected out Pharyngeal- Thin Teaspoon Penetration/Aspiration during swallow;Reduced pharyngeal peristalsis;Reduced epiglottic inversion;Reduced anterior laryngeal mobility;Reduced laryngeal elevation;Reduced airway/laryngeal closure;Reduced tongue base retraction;Pharyngeal residue - valleculae Pharyngeal Material enters airway, remains ABOVE vocal cords and not ejected out Pharyngeal- Thin Cup Reduced pharyngeal peristalsis;Reduced epiglottic inversion;Reduced anterior laryngeal mobility;Reduced laryngeal elevation;Reduced airway/laryngeal closure;Reduced tongue base retraction;Penetration/Aspiration during swallow;Pharyngeal residue - valleculae;Pharyngeal residue - pyriform;Trace aspiration Pharyngeal Material enters airway, CONTACTS cords and then ejected out Pharyngeal- Thin Straw Reduced pharyngeal peristalsis;Reduced epiglottic inversion;Reduced anterior laryngeal mobility;Reduced laryngeal elevation;Reduced airway/laryngeal closure;Reduced tongue base retraction;Pharyngeal residue - valleculae;Penetration/Aspiration during swallow Pharyngeal Material enters airway, passes BELOW cords without attempt by patient to eject out (silent aspiration) Pharyngeal- Puree Reduced tongue base retraction;Pharyngeal residue - valleculae;Reduced airway/laryngeal closure;Reduced pharyngeal peristalsis;Reduced epiglottic inversion  Pharyngeal -- Pharyngeal- Mechanical Soft Reduced tongue base retraction;Pharyngeal residue - valleculae;Reduced pharyngeal peristalsis;Reduced epiglottic inversion Pharyngeal -- Pharyngeal- Regular -- Pharyngeal -- Pharyngeal- Multi-consistency -- Pharyngeal -- Pharyngeal- Pill -- Pharyngeal -- Pharyngeal Comment --    [name], MS [facility] SLP [phone]          Microbiology: Recent Results (from the past 240 hour(s))  MRSA PCR Screening     Status: None   Collection Time: 07/21/16  2:20 AM  Result Value Ref Range Status   MRSA by PCR NEGATIVE NEGATIVE Final    Comment:        The GeneXpert MRSA Assay (FDA approved for NASAL specimens only), is one component of a comprehensive MRSA colonization surveillance program. It is not intended to diagnose MRSA infection nor to guide or monitor treatment for MRSA infections.   Culture, blood (routine x 2) Call  MD if unable to obtain prior to antibiotics being given     Status: None (Preliminary result)   Collection Time: 07/21/16  2:55 AM  Result Value Ref Range Status   Specimen Description BLOOD RIGHT HAND  Final   Special Requests   Final    BOTTLES DRAWN AEROBIC ONLY Blood Culture adequate volume   Culture   Final    NO GROWTH 2 DAYS Performed at [facility], [address], [place], [place] [address]    Report Status PENDING  Incomplete  Culture, blood (routine x 2) Call MD if unable to obtain prior to antibiotics being given     Status: None (Preliminary result)   Collection Time: 07/21/16  2:55 AM  Result Value Ref Range Status   Specimen Description BLOOD LEFT ANTECUBITAL  Final   Special Requests   Final    BOTTLES DRAWN AEROBIC AND ANAEROBIC Blood Culture results may not be optimal due to an excessive volume of blood received in culture bottles   Culture   Final    NO GROWTH 2 DAYS Performed at [facility], [address], [place], [place] [address]    Report Status PENDING  Incomplete     Labs: Basic  Metabolic Panel:  Recent Labs Lab 07/20/16 2334 07/21/16 0255 07/22/16 0351 07/24/16 0623  NA 140 140 141 142  K 4.1 3.9 3.5 3.4*  CL 105 106 112* 112*  CO2 24 23 21* 22  GLUCOSE 135* 169* 96 76  BUN 24* 26* 18 5*  CREATININE 1.20 1.12 0.75 0.70  CALCIUM 8.9 8.8* 8.6* 8.1*   Liver Function Tests:  Recent Labs Lab 07/20/16 2334  AST 16  ALT 10*  ALKPHOS 82  BILITOT 0.7  PROT 7.4  ALBUMIN 3.1*   No results for input(s): LIPASE, AMYLASE in the last 168 hours. No results for input(s): AMMONIA in the last 168 hours. CBC:  Recent Labs Lab 07/20/16 2334 07/21/16 0255 07/22/16 0351 07/24/16 0623  WBC 21.4* 20.5* 16.1* 13.8*  NEUTROABS 17.9*  --   --   --   HGB 15.2 15.2 12.1* 11.7*  HCT 44.2 44.2 36.6* 34.6*  MCV 89.7 89.7 90.6 88.5  PLT 181 165 158 164   Cardiac Enzymes: No results for input(s): CKTOTAL, CKMB, CKMBINDEX, TROPONINI in the last 168 hours. BNP: BNP (last 3 results) No results for input(s): BNP in the last 8760 hours.  ProBNP (last 3 results) No results for input(s): PROBNP in the last 8760 hours.  CBG: No results for input(s): GLUCAP in the last 168 hours.     SignedEdsel Petrin  [facility] 07/24/2016, 11:17 AM

## 2016-07-24 NOTE — Progress Notes (Signed)
[facility]. Patient & son, Lorelee CoverRussell JR aware. Discharge packet given to RN. [facility] called for transport.   Stacy GardnerErin Ramond Darnell, [facility] Clinical Social Worker [phone]

## 2016-07-24 NOTE — Discharge Instructions (Signed)
Aspiration Pneumonia °Aspiration pneumonia is an infection in your lungs. It occurs when food, liquid, or stomach contents (vomit) are inhaled (aspirated) into your lungs. When these things get into your lungs, swelling (inflammation) and infection can occur. This can make it difficult for you to breathe. Aspiration pneumonia is a serious condition and can be life threatening. °What increases the risk? °Aspiration pneumonia is more likely to occur when a person's cough (gag) reflex or ability to swallow has been decreased. Some things that can do this include: °· Having a brain injury or disease, such as stroke, seizures, Parkinson's disease, dementia, or amyotrophic lateral sclerosis (ALS). °· Being given general anesthetic for procedures. °· Being in a coma (unconscious). °· Having a narrowing of the tube that carries food to the stomach (esophagus). °· Drinking too much alcohol. If a person passes out and vomits, vomit can be swallowed into the lungs. °· Taking certain medicines, such as tranquilizers or sedatives. ° °What are the signs or symptoms? °· Coughing after swallowing food or liquids. °· Breathing problems, such as wheezing or shortness of breath. °· Bluish skin. This can be caused by lack of oxygen. °· Coughing up food or mucus. The mucus might contain blood, greenish material, or yellowish-white fluid (pus). °· Fever. °· Chest pain. °· Being more tired than usual (fatigue). °· Sweating more than usual. °· Bad breath. °How is this diagnosed? °A physical exam will be done. During the exam, the health care provider will listen to your lungs with a stethoscope to check for: °· Crackling sounds in the lungs. °· Decreased breath sounds. °· A rapid heartbeat. ° °Various tests may be ordered. These may include: °· Chest X-ray. °· CT scan. °· Swallowing study. This test looks at how food is swallowed and whether it goes into your breathing tube (trachea) or food pipe (esophagus). °· Sputum culture. Saliva and  mucus (sputum) are collected from the lungs or the tubes that carry air to the lungs (bronchi). The sputum is then tested for bacteria. °· Bronchoscopy. This test uses a flexible tube (bronchoscope) to see inside the lungs. ° °How is this treated? °Treatment will usually include antibiotic medicines. Other medicines may also be used to reduce fever or pain. You may need to be treated in the hospital. In the hospital, your breathing will be carefully monitored. Depending on how well you are breathing, you may need to be given oxygen, or you may need breathing support from a breathing machine (ventilator). For people who fail a swallowing study, a feeding tube might be placed in the stomach, or they may be asked to avoid certain food textures or liquids when they eat. °Follow these instructions at home: °· Carefully follow any special eating instructions you were given, such as avoiding certain food textures or thickening liquids. This reduces the risk of developing aspiration pneumonia again. °· Only take over-the-counter or prescription medicines as directed by your health care provider. Follow the directions carefully. °· If you were prescribed antibiotics, take them as directed. Finish them even if you start to feel better. °· Rest as instructed by your health care provider. °· Keep all follow-up appointments with your health care provider. °Contact a health care provider if: °· You develop worsening shortness of breath, wheezing, or difficulty breathing. °· You develop a fever. °· You have chest pain. °This information is not intended to replace advice given to you by your health care provider. Make sure you discuss any questions you have with your health care   provider. °Document Released: 01/03/2009 Document Revised: 08/20/2015 Document Reviewed: 08/24/2012 °Elsevier Interactive Patient Education © 2017 [employer] ° °

## 2016-07-24 NOTE — Progress Notes (Signed)
CSW spoke with [facility]ila [facility]. Patient is able to return to facility today. CSW will follow up with patient for any discharge needs.   Stacy GardnerErin Jalen Daluz, LCSWA Clinical Social Worker [phone]

## 2016-07-24 NOTE — Progress Notes (Signed)
Daily Progress Note   Patient Name: Kristopher Jennings       Date: 07/24/2016 DOB: 09-07-1940  Age: 76 y.o. MRN#: [id number] Attending Physician: Edsel Petrin, DO Primary Care Physician: Renford Dills, MD Admit Date: 07/20/2016  Reason for Consultation/Follow-up: Establishing goals of care  Subjective:  awake, alert Remains with flat affect, how ever with encouragement, was able to talk more.  Son at bedside, see below.   Length of Stay: 3  Current Medications: Scheduled Meds:  . buPROPion  200 mg Oral Daily  . chlorhexidine  15 mL Mouth Rinse BID  . cholecalciferol  2,000 Units Oral Daily  . clonazePAM  0.5 mg Oral Daily  . Dextromethorphan-Quinidine  1 capsule Oral BID  . enoxaparin (LOVENOX) injection  40 mg Subcutaneous Q24H  . levothyroxine  175 mcg Oral QAC breakfast  . mouth rinse  15 mL Mouth Rinse q12n4p  . potassium chloride  40 mEq Oral Daily  . QUEtiapine  50 mg Oral BID  . tamsulosin  0.8 mg Oral QHS    Continuous Infusions: . sodium chloride 100 mL/hr at 07/24/16 0057  . ampicillin-sulbactam (UNASYN) IV Stopped (07/24/16 0601)    PRN Meds: acetaminophen, albuterol, morphine injection  Physical Exam         NAD Pale weak S1 S2 Coarse breath sounds anteriorly Abdomen soft No edema Thin muscle wasting   Vital Signs: BP 115/62 (BP Location: Left Arm)   Pulse 74   Temp 97.5 F (36.4 C) (Oral)   Resp 19   Ht 5\' 9"  (1.753 m)   Wt 52.2 kg (115 lb 1.3 oz)   SpO2 95%   BMI 16.99 kg/m  SpO2: SpO2: 95 % O2 Device: O2 Device: Nasal Cannula O2 Flow Rate: O2 Flow Rate (L/min): 2 L/min  Intake/output summary:  Intake/Output Summary (Last 24 hours) at 07/24/16 0955 Last data filed at 07/24/16 0643  Gross per 24 hour  Intake                0 ml  Output              1050 ml  Net            -1050 ml   LBM: Last BM Date: 07/23/16 Baseline Weight: Weight: 58.5 kg (128 lb 15.5 oz) Most recent weight: Weight:  52.2 kg (115 lb 1.3 oz)       Palliative Assessment/Data:    Flowsheet Rows     Most Recent Value  Intake Tab  Referral Department  Hospitalist  Unit at Time of Referral  Med/Surg Unit  Palliative Care Primary Diagnosis  Other (Comment)  Date Notified  07/21/16  Palliative Care Type  Return patient Palliative Care  Reason for referral  Clarify Goals of Care  Date of Admission  07/20/16  Date first seen by Palliative Care  07/23/16  # of days Palliative referral response time  2 Day(s)  # of days IP prior to Palliative referral  1  Clinical Assessment  Palliative Performance Scale Score  30%  Pain Max last 24 hours  4  Pain Min Last 24 hours  3  Dyspnea Max Last 24 Hours  4  Dyspnea Min Last 24 hours  3  Nausea Max Last 24 Hours  4  Nausea Min Last 24 Hours  3  Psychosocial & Spiritual Assessment  Palliative Care Outcomes  Patient/Family meeting held?  Yes  Who was at the meeting?  patient and son.   Palliative Care Outcomes  Clarified goals of care      Patient Active Problem List   Diagnosis Date Noted  . Protein-calorie malnutrition, severe 07/23/2016  . HCAP (healthcare-associated pneumonia) 07/21/2016  . Encounter for palliative care   . Goals of care, counseling/discussion   . Pressure injury of skin 03/20/2016  . Lobar pneumonia (HCC) 03/19/2016  . Sepsis, unspecified organism (HCC) 03/19/2016  . Acute encephalopathy 03/19/2016  . Aspiration pneumonia (HCC) 03/19/2016  . BPH (benign prostatic hyperplasia) 01/01/2014  . Thyroid activity decreased 10/04/2013  . Fever, unspecified 10/03/2013  . Unspecified constipation 10/02/2013  . Other specified disease of white blood cells 10/02/2013  . Cough 05/03/2013  . Urinary tract infection, site not specified 02/26/2013  . Urinary hesitancy 02/19/2013  .  Parkinson's disease (HCC) 01/12/2013  . Essential hypertension, benign 01/08/2013  . Hypertrophy of prostate without urinary obstruction and other lower urinary tract symptoms (LUTS) 01/08/2013  . Anxiety state, unspecified 01/08/2013  . Benign essential tremor 12/13/2012  . H/O urinary retention 12/13/2012  . Hypothyroidism 12/13/2012  . Insomnia 12/13/2012    Palliative Care Assessment & Plan   Patient Profile:    Assessment:  Kristopher GoldRussell Childressis a 76 y.o.gentleman with a history of Parkinson's disease with cognitive impairment, prior CVA, essential tremor, dysphagia, and previous admission in January 2018 for sepsis secondary to aspiration pneumonia.   Recommendations/Plan:   we had a long discussion with the patient in his room this morning, son was at the bedside, patient remembers that it is his son's 25th wedding anniversary today, brief life review also performed, patient worked in [employer], has travelled to 26 countries and visited every single state in the [place]. Patient is aware that he is aspirating everything and has high risk for ongoing decline and decompensation despite everyone's best efforts. Patient initially was resistant to hospice following [facility], "hospice is for people with cancer."  We discussed about how hospice could be an extra layer of support. We also discussed about the fact that, if there ever came a time when the burden of recurrent hospitalizations/IV antibiotics, IVF, blood work, imaging etc became too high than its benefits, we could avoid re-hospitalizations, focus on comfort measures and keep the [facility] with hosp[facility] following.   Patient and son's goals are not comfort-only at this point. Son  has completed a MOST form at the facility, he states that it outlines DNR DNI, transfer to hospital, use antibiotics, no feeding tubes. Unfortunately, a copy is not available in the rider chart, I don't want to duplicate another  MOST form,since he already has one, and is clear about his choices. Patient and son still believe that the patient, at present, benefits from recurrent hospitalizations and improves to some extent.   For now, we will have to continue current treatment and have hospice follow the patient whenever he is ready to go back to [facility].   Code Status:    Code Status Orders        Start     Ordered   07/21/16 0549  Do not attempt resuscitation (DNR)  Continuous    Question Answer Comment  In the event of cardiac or respiratory ARREST Do not call a "code blue"   In the event of cardiac or respiratory ARREST Do not perform Intubation, CPR, defibrillation or ACLS   In the event of cardiac or respiratory ARREST Use medication by any route, position, wound care, and other measures to relive pain and suffering. May use oxygen, suction and manual treatment of airway obstruction as needed for comfort.      07/21/16 0548    Code Status History    Date Active Date Inactive Code Status Order ID Comments User Context   07/21/2016  1:57 AM 07/21/2016  5:48 AM Full Code [id number]  Michael Litter, MD ED   07/21/2016  1:52 AM 07/21/2016  1:57 AM DNR [id number]  Michael Litter, MD ED   03/21/2016  8:34 AM 03/23/2016  6:05 PM DNR [id number]  Joseph Art, DO Inpatient   03/19/2016  4:20 PM 03/21/2016  8:34 AM Full Code [id number]  Standley Brooking, MD Inpatient    Advance Directive Documentation     Most Recent Value  Type of Advance Directive  Out of facility DNR (pink MOST or yellow form)  Pre-existing out of facility DNR order (yellow form or pink MOST form)  Yellow form placed in chart (order not valid for inpatient use)  "MOST" Form in Place?  -       Prognosis:   < 6 months  Discharge Planning:  Skilled Nursing Facility with Hospice  Care plan was discussed with patient, RN, son.   Thank you for allowing the Palliative Medicine Team to assist in the care of this patient.   Time In: 8 Time Out:  8.35 Total Time 35 Prolonged Time Billed  no       Greater than 50%  of this time was spent counseling and coordinating care related to the above assessment and plan.  Rosalin Hawking, MD [phone]  Please contact Palliative Medicine Team phone at [phone] for questions and concerns.

## 2016-07-24 NOTE — Progress Notes (Signed)
Pt left at this time with PTAR. Alert and without s/s of distress.

## 2016-07-26 LAB — CULTURE, BLOOD (ROUTINE X 2)
CULTURE: NO GROWTH
CULTURE: NO GROWTH
Special Requests: ADEQUATE

## 2017-08-29 ENCOUNTER — Inpatient Hospital Stay (HOSPITAL_COMMUNITY)
Admission: EM | Admit: 2017-08-29 | Discharge: 2017-09-04 | DRG: 871 | Disposition: A | Discharge status: Skilled Nursing Facility | Payer: Medicare Other | Source: Skilled Nursing Facility | Attending: Internal Medicine | Admitting: Internal Medicine

## 2017-08-29 ENCOUNTER — Encounter (HOSPITAL_COMMUNITY): Payer: Self-pay | Admitting: Emergency Medicine

## 2017-08-29 ENCOUNTER — Emergency Department (HOSPITAL_COMMUNITY): Payer: Medicare Other

## 2017-08-29 ENCOUNTER — Other Ambulatory Visit: Payer: Self-pay

## 2017-08-29 DIAGNOSIS — F329 Major depressive disorder, single episode, unspecified: Secondary | ICD-10-CM | POA: Diagnosis present

## 2017-08-29 DIAGNOSIS — G9341 Metabolic encephalopathy: Secondary | ICD-10-CM | POA: Diagnosis present

## 2017-08-29 DIAGNOSIS — E861 Hypovolemia: Secondary | ICD-10-CM | POA: Diagnosis present

## 2017-08-29 DIAGNOSIS — R64 Cachexia: Secondary | ICD-10-CM | POA: Diagnosis present

## 2017-08-29 DIAGNOSIS — F419 Anxiety disorder, unspecified: Secondary | ICD-10-CM | POA: Diagnosis present

## 2017-08-29 DIAGNOSIS — R0902 Hypoxemia: Secondary | ICD-10-CM | POA: Diagnosis present

## 2017-08-29 DIAGNOSIS — Z7401 Bed confinement status: Secondary | ICD-10-CM

## 2017-08-29 DIAGNOSIS — E039 Hypothyroidism, unspecified: Secondary | ICD-10-CM | POA: Diagnosis present

## 2017-08-29 DIAGNOSIS — Z8701 Personal history of pneumonia (recurrent): Secondary | ICD-10-CM

## 2017-08-29 DIAGNOSIS — A419 Sepsis, unspecified organism: Secondary | ICD-10-CM | POA: Diagnosis present

## 2017-08-29 DIAGNOSIS — J690 Pneumonitis due to inhalation of food and vomit: Secondary | ICD-10-CM | POA: Diagnosis present

## 2017-08-29 DIAGNOSIS — G20 Parkinson's disease: Secondary | ICD-10-CM | POA: Diagnosis present

## 2017-08-29 DIAGNOSIS — Z8673 Personal history of transient ischemic attack (TIA), and cerebral infarction without residual deficits: Secondary | ICD-10-CM | POA: Diagnosis not present

## 2017-08-29 DIAGNOSIS — Z681 Body mass index (BMI) 19 or less, adult: Secondary | ICD-10-CM | POA: Diagnosis not present

## 2017-08-29 DIAGNOSIS — E43 Unspecified severe protein-calorie malnutrition: Secondary | ICD-10-CM | POA: Diagnosis present

## 2017-08-29 DIAGNOSIS — Z91011 Allergy to milk products: Secondary | ICD-10-CM

## 2017-08-29 DIAGNOSIS — E876 Hypokalemia: Secondary | ICD-10-CM | POA: Diagnosis present

## 2017-08-29 DIAGNOSIS — G20A1 Parkinson's disease without dyskinesia, without mention of fluctuations: Secondary | ICD-10-CM | POA: Diagnosis present

## 2017-08-29 DIAGNOSIS — E871 Hypo-osmolality and hyponatremia: Secondary | ICD-10-CM | POA: Diagnosis present

## 2017-08-29 DIAGNOSIS — N179 Acute kidney failure, unspecified: Secondary | ICD-10-CM | POA: Diagnosis present

## 2017-08-29 DIAGNOSIS — R5381 Other malaise: Secondary | ICD-10-CM

## 2017-08-29 DIAGNOSIS — Z888 Allergy status to other drugs, medicaments and biological substances status: Secondary | ICD-10-CM

## 2017-08-29 DIAGNOSIS — I10 Essential (primary) hypertension: Secondary | ICD-10-CM | POA: Diagnosis present

## 2017-08-29 DIAGNOSIS — E870 Hyperosmolality and hypernatremia: Secondary | ICD-10-CM | POA: Diagnosis present

## 2017-08-29 DIAGNOSIS — R509 Fever, unspecified: Secondary | ICD-10-CM | POA: Diagnosis not present

## 2017-08-29 DIAGNOSIS — Z87891 Personal history of nicotine dependence: Secondary | ICD-10-CM

## 2017-08-29 DIAGNOSIS — Z79890 Hormone replacement therapy (postmenopausal): Secondary | ICD-10-CM

## 2017-08-29 DIAGNOSIS — N400 Enlarged prostate without lower urinary tract symptoms: Secondary | ICD-10-CM | POA: Diagnosis present

## 2017-08-29 DIAGNOSIS — Z79899 Other long term (current) drug therapy: Secondary | ICD-10-CM

## 2017-08-29 DIAGNOSIS — N390 Urinary tract infection, site not specified: Secondary | ICD-10-CM | POA: Diagnosis present

## 2017-08-29 DIAGNOSIS — J189 Pneumonia, unspecified organism: Secondary | ICD-10-CM | POA: Diagnosis not present

## 2017-08-29 DIAGNOSIS — K219 Gastro-esophageal reflux disease without esophagitis: Secondary | ICD-10-CM | POA: Diagnosis present

## 2017-08-29 DIAGNOSIS — Z885 Allergy status to narcotic agent status: Secondary | ICD-10-CM

## 2017-08-29 DIAGNOSIS — G4700 Insomnia, unspecified: Secondary | ICD-10-CM | POA: Diagnosis present

## 2017-08-29 DIAGNOSIS — Z66 Do not resuscitate: Secondary | ICD-10-CM | POA: Diagnosis present

## 2017-08-29 LAB — CBC WITH DIFFERENTIAL/PLATELET
BASOS ABS: 0 10*3/uL (ref 0.0–0.1)
Basophils Relative: 0 %
Eosinophils Absolute: 0 10*3/uL (ref 0.0–0.7)
Eosinophils Relative: 0 %
HEMATOCRIT: 44.2 % (ref 39.0–52.0)
HEMOGLOBIN: 14.5 g/dL (ref 13.0–17.0)
LYMPHS PCT: 6 %
Lymphs Abs: 1.1 10*3/uL (ref 0.7–4.0)
MCH: 31.7 pg (ref 26.0–34.0)
MCHC: 32.8 g/dL (ref 30.0–36.0)
MCV: 96.7 fL (ref 78.0–100.0)
MONO ABS: 1.7 10*3/uL — AB (ref 0.1–1.0)
Monocytes Relative: 10 %
NEUTROS ABS: 14.6 10*3/uL — AB (ref 1.7–7.7)
NEUTROS PCT: 84 %
Platelets: 199 10*3/uL (ref 150–400)
RBC: 4.57 MIL/uL (ref 4.22–5.81)
RDW: 13.5 % (ref 11.5–15.5)
WBC: 17.5 10*3/uL — ABNORMAL HIGH (ref 4.0–10.5)

## 2017-08-29 LAB — COMPREHENSIVE METABOLIC PANEL
ALBUMIN: 3.3 g/dL — AB (ref 3.5–5.0)
ALT: 5 U/L — ABNORMAL LOW (ref 17–63)
ANION GAP: 12 (ref 5–15)
AST: 22 U/L (ref 15–41)
Alkaline Phosphatase: 83 U/L (ref 38–126)
BILIRUBIN TOTAL: 0.8 mg/dL (ref 0.3–1.2)
BUN: 56 mg/dL — ABNORMAL HIGH (ref 6–20)
CO2: 24 mmol/L (ref 22–32)
Calcium: 8.9 mg/dL (ref 8.9–10.3)
Chloride: 114 mmol/L — ABNORMAL HIGH (ref 101–111)
Creatinine, Ser: 2.08 mg/dL — ABNORMAL HIGH (ref 0.61–1.24)
GFR calc Af Amer: 34 mL/min — ABNORMAL LOW (ref >60)
GFR calc non Af Amer: 29 mL/min — ABNORMAL LOW (ref >60)
GLUCOSE: 161 mg/dL — AB (ref 65–99)
POTASSIUM: 3.8 mmol/L (ref 3.5–5.1)
SODIUM: 150 mmol/L — AB (ref 135–145)
TOTAL PROTEIN: 8.1 g/dL (ref 6.5–8.1)

## 2017-08-29 LAB — URINALYSIS, ROUTINE W REFLEX MICROSCOPIC
Bilirubin Urine: NEGATIVE
Glucose, UA: NEGATIVE mg/dL
KETONES UR: 5 mg/dL — AB
Nitrite: NEGATIVE
PROTEIN: 100 mg/dL — AB
Specific Gravity, Urine: 1.018 (ref 1.005–1.030)
pH: 7 (ref 5.0–8.0)

## 2017-08-29 LAB — I-STAT CG4 LACTIC ACID, ED: Lactic Acid, Venous: 1.59 mmol/L (ref 0.5–1.9)

## 2017-08-29 LAB — MRSA PCR SCREENING: MRSA BY PCR: NEGATIVE

## 2017-08-29 MED ORDER — BUPROPION HCL ER (SR) 100 MG PO TB12
200.0000 mg | ORAL_TABLET | Freq: Every day | ORAL | Status: DC
  Administered 2017-08-29 – 2017-09-04 (×6): 200 mg via ORAL
  Filled 2017-08-29 (×7): qty 2

## 2017-08-29 MED ORDER — IPRATROPIUM-ALBUTEROL 0.5-2.5 (3) MG/3ML IN SOLN: 3.0000 mL | RESPIRATORY_TRACT | Status: DC | PRN

## 2017-08-29 MED ORDER — CARBIDOPA-LEVODOPA 25-100 MG PO TABS
2.0000 | ORAL_TABLET | Freq: Three times a day (TID) | ORAL | Status: DC
  Administered 2017-08-29 – 2017-09-03 (×14): 2 via ORAL
  Filled 2017-08-29 (×17): qty 2

## 2017-08-29 MED ORDER — MORPHINE SULFATE (PF) 2 MG/ML IV SOLN
2.0000 mg | INTRAVENOUS | Status: DC | PRN
  Administered 2017-08-29 – 2017-08-31 (×2): 2 mg via INTRAVENOUS
  Filled 2017-08-29 (×2): qty 1

## 2017-08-29 MED ORDER — OXYCODONE-ACETAMINOPHEN 5-325 MG PO TABS: 1.0000 | ORAL_TABLET | Freq: Four times a day (QID) | ORAL | Status: DC | PRN

## 2017-08-29 MED ORDER — SODIUM CHLORIDE 0.9 % IV BOLUS
1000.0000 mL | Freq: Once | INTRAVENOUS | Status: AC
  Administered 2017-08-29: 1000 mL via INTRAVENOUS

## 2017-08-29 MED ORDER — VANCOMYCIN HCL IN DEXTROSE 750-5 MG/150ML-% IV SOLN: 750.0000 mg | INTRAVENOUS | Status: DC

## 2017-08-29 MED ORDER — MELATONIN 3 MG PO TABS
3.0000 mg | ORAL_TABLET | Freq: Every day | ORAL | Status: DC
  Administered 2017-08-30 – 2017-09-03 (×5): 3 mg via ORAL
  Filled 2017-08-29 (×6): qty 1

## 2017-08-29 MED ORDER — SODIUM CHLORIDE 0.9 % IV SOLN
INTRAVENOUS | Status: DC
  Administered 2017-08-29: 10:00:00 via INTRAVENOUS

## 2017-08-29 MED ORDER — VANCOMYCIN HCL IN DEXTROSE 1-5 GM/200ML-% IV SOLN
1000.0000 mg | Freq: Once | INTRAVENOUS | Status: AC
  Administered 2017-08-29: 1000 mg via INTRAVENOUS
  Filled 2017-08-29: qty 200

## 2017-08-29 MED ORDER — SODIUM CHLORIDE 0.9 % IV SOLN
1.0000 g | INTRAVENOUS | Status: DC
  Administered 2017-08-30 – 2017-09-01 (×3): 1 g via INTRAVENOUS
  Filled 2017-08-29 (×3): qty 1

## 2017-08-29 MED ORDER — DM-GUAIFENESIN ER 30-600 MG PO TB12
1.0000 | ORAL_TABLET | Freq: Two times a day (BID) | ORAL | Status: DC
  Filled 2017-08-29 (×3): qty 1

## 2017-08-29 MED ORDER — IPRATROPIUM-ALBUTEROL 0.5-2.5 (3) MG/3ML IN SOLN
3.0000 mL | Freq: Four times a day (QID) | RESPIRATORY_TRACT | Status: DC
  Administered 2017-08-29 (×2): 3 mL via RESPIRATORY_TRACT
  Filled 2017-08-29 (×2): qty 3

## 2017-08-29 MED ORDER — LEVOTHYROXINE SODIUM 100 MCG IV SOLR
75.0000 ug | Freq: Every day | INTRAVENOUS | Status: DC
  Administered 2017-08-30 – 2017-08-31 (×2): 75 ug via INTRAVENOUS
  Filled 2017-08-29 (×2): qty 5

## 2017-08-29 MED ORDER — LEVOTHYROXINE SODIUM 150 MCG PO TABS
150.0000 ug | ORAL_TABLET | Freq: Every day | ORAL | Status: DC
  Administered 2017-08-29: 150 ug via ORAL
  Filled 2017-08-29 (×2): qty 1

## 2017-08-29 MED ORDER — SODIUM CHLORIDE 0.9 % IV SOLN
2.0000 g | Freq: Once | INTRAVENOUS | Status: AC
  Administered 2017-08-29: 2 g via INTRAVENOUS
  Filled 2017-08-29: qty 2

## 2017-08-29 MED ORDER — SODIUM CHLORIDE 0.45 % IV SOLN
INTRAVENOUS | Status: DC
  Administered 2017-08-29 – 2017-09-04 (×9): via INTRAVENOUS

## 2017-08-29 MED ORDER — SENNA 8.6 MG PO TABS
1.0000 | ORAL_TABLET | Freq: Every day | ORAL | Status: DC
  Administered 2017-08-30 – 2017-09-03 (×5): 8.6 mg via ORAL
  Filled 2017-08-29 (×6): qty 1

## 2017-08-29 MED ORDER — HEPARIN SODIUM (PORCINE) 5000 UNIT/ML IJ SOLN
5000.0000 [IU] | Freq: Three times a day (TID) | INTRAMUSCULAR | Status: DC
  Administered 2017-08-29 – 2017-09-01 (×9): 5000 [IU] via SUBCUTANEOUS
  Filled 2017-08-29 (×9): qty 1

## 2017-08-29 MED ORDER — CLONAZEPAM 0.5 MG PO TABS
0.5000 mg | ORAL_TABLET | Freq: Every day | ORAL | Status: DC
  Administered 2017-08-29 – 2017-09-04 (×5): 0.5 mg via ORAL
  Filled 2017-08-29 (×6): qty 1

## 2017-08-29 MED ORDER — TAMSULOSIN HCL 0.4 MG PO CAPS
0.8000 mg | ORAL_CAPSULE | Freq: Every day | ORAL | Status: DC
  Filled 2017-08-29: qty 2

## 2017-08-29 NOTE — Progress Notes (Signed)
A consult was received from an ED physician for Vancomycin, cefepimeper pharmacy dosing.  The patient's profile has been reviewed for ht/wt/allergies/indication/available labs.   A one time order has been placed for Vancomycin 1gm iv x1 and Cefepime 2gm iv x1.  Further antibiotics/pharmacy consults should be ordered by admitting physician if indicated.                       Thank you, Aleene DavidsonGrimsley Jr, Alexa Golebiewski Crowford 08/29/2017  5:03 AM

## 2017-08-29 NOTE — Progress Notes (Signed)
Pharmacy Antibiotic Note  Barb MerinoRussell Colla is a 77 y.o. male admitted on 08/29/2017 with pneumonia.  Pharmacy has been consulted for vancomycin and cefepime dosing.  Patient reported to have a positive cough with yellow-green sputum.  He reports feeling SOB.  Recently admitted with pneumonia - suspected due to aspiration   Today,08/29/2017  Renal: SCr elevated  WBC elevated  Afebrile in ED but reported temp 102.7 as SNF  Plan:  Vancomycin 1gm x 1 then vancomycin 750mg  IV q48h  Suspect renal function will improve and will need to adjust maintenance vancomycin dose  Cefepime 2gm x1 then 1gm IV q24h  Suggest checking MRSA PCR  Narrow with culture results  Recommend daily SCr for now  Height: 6' (182.9 cm) Weight: 115 lb (52.2 kg) IBW/kg (Calculated) : 77.6  Temp (24hrs), Avg:99 F (37.2 C), Min:98.5 F (36.9 C), Max:99.4 F (37.4 C)  Recent Labs  Lab 08/29/17 0457 08/29/17 0511  WBC 17.5*  --   CREATININE 2.08*  --   LATICACIDVEN  --  1.59    Estimated Creatinine Clearance: 22.7 mL/min (A) (by C-G formula based on SCr of 2.08 mg/dL (H)).    Allergies  Allergen Reactions  . Demerol [Meperidine] Other (See Comments)    Reaction:  Unknown   . Lactose Intolerance (Gi) Other (See Comments)    Reaction:  Unknown   . Nickel Other (See Comments)    Reaction:  Unknown   . Oxycodone Other (See Comments)    Reaction:  Unknown     Antimicrobials this admission: 6/10 vanco >> 6/10 cefepime >>  Dose adjustments this admission:  Microbiology results: 6/10 BCx:  6/10 UCx:    Thank you for allowing pharmacy to be a part of this patient's care.  Juliette Alcideustin Nadja Lina, PharmD, BCPS.   Pager: [phone] 08/29/2017 9:36 AM

## 2017-08-29 NOTE — ED Notes (Signed)
Both ECG monitors (on the wall and portable) are unable to produce an ECG without artifact. The portable monitor is left connected to the patient in attempt to obtain clear ECG. Will check back later.

## 2017-08-29 NOTE — ED Triage Notes (Signed)
Per  EMS , [facility]. [facility] , reported of fever  started yesterday and  productive cough. Facility staff reported of pt. Having temp of 102.7'F at 11am this morning , tylenol  650mg  Suppository given.  Congestion and productive cough also reported. Pt. Was at [facility] [place]  3 L/min  at facility. the patient. Alert ad oriented x1, as baseline. No s/s pain noted. .breathing trx given x 1 via EMS.  Staff reported that they collected urine sample and pt. Possible for UTI. Pt. Has DNR form.

## 2017-08-29 NOTE — ED Notes (Signed)
Bed: [id number] Expected date:  Expected time:  Means of arrival:  Comments: 6427m fever

## 2017-08-29 NOTE — H&P (Addendum)
History and Physical    Kristopher Jennings ZOX:[id number] DOB: 03-18-1941 DOA: 08/29/2017  PCP: Renford Dills, MD   Patient coming from: SNF   Chief Complaint: Fever, pneumonia  HPI: Kristopher Jennings is a 77 y.o. male with medical history significant of stroke, Parkinson's disease, recurrent aspiration pneumonia, inability to walk due to rigidity, BPH, hypothyroidism who presented to the emergency department after he was sent from his skilled nursing facility for the evaluation of fever and possible pneumonia.  Patient has been admitted in the past for recurrent aspiration pneumonia.  He was last discharged on 07/24/2016 after treatment for aspiration pneumonia.  Patient is a very poor historian.  All the information was taken from his son on the phone.  As per son he was doing okay until yesterday when he was reported that he developed fever at around 2:30 am.  They did a chest x-ray and urinalysis in the skilled nursing facility.  Pneumonia and urinary tract infection were suspected.  Patient also was coughing and had increased sputum production.  Fever spiked despite being given Tylenol so he was sent to the emergency department.  Patient has issues with aspiration pneumonia after he had a stroke in the past.  Palliative care  were also involved during the last admission and the plan was to follow-up as an outpatient and possible transition his care to hospice but as per the son he continued to get better so that plan was canceled.  Patient follows with neurology for his ambulatory dysfunction and Parkinson's. Patient seen and examined the bedside in the emergency department.  He looked comfortable.  He is alert and oriented but looks very weak.  He complains of shortness of breath.  He denies any chest pain, abdominal pain, dysuria, nausea, vomiting, diarrhea or headache.  ED Course: Chest x-ray done in the emergency department showed bibasilar atelectasis.  Urine analysis was positive for urinary  tract infection.  Patient has history of recurrent aspiration pneumonia.  Clinically, pneumonia was suspected.  Started on broad-spectrum antibiotics.  [facility] hospitalist called for admission  Review of Systems: As per HPI otherwise 10 point review of systems negative.    Past Medical History:  Diagnosis Date  . Anxiety   . Arthritis   . Ataxia   . Cauda equina syndrome (HCC)   . Depression   . Dysphagia   . Essential Tremors   . GERD (gastroesophageal reflux disease)   . Paralysis agitans (HCC) 01/12/2013  . Parkinson disease (HCC)   . Stroke (HCC)   . Thyroid disease   . UTI (lower urinary tract infection)     Past Surgical History:  Procedure Laterality Date  . LAMINECTOMY    . SPINE SURGERY       reports that he has quit smoking. He has never used smokeless tobacco. He reports that he does not drink alcohol or use drugs.  Allergies  Allergen Reactions  . Demerol [Meperidine] Other (See Comments)    Reaction:  Unknown   . Lactose Intolerance (Gi) Other (See Comments)    Reaction:  Unknown   . Nickel Other (See Comments)    Reaction:  Unknown   . Oxycodone Other (See Comments)    Reaction:  Unknown     No family history on file.   Prior to Admission medications   Medication Sig Start Date End Date Taking? Authorizing Provider  acetaminophen (TYLENOL) 325 MG tablet Take 650 mg by mouth every 4 (four) hours as needed for mild pain, moderate pain, fever  or headache.    Yes [provider]  acetaminophen (TYLENOL) 650 MG suppository Place 650 mg rectally every 4 (four) hours as needed for fever.   Yes [provider]  buPROPion (WELLBUTRIN SR) 200 MG 12 hr tablet Take 200 mg by mouth daily before breakfast.    Yes [provider]  carbidopa-levodopa (SINEMET IR) 25-100 MG tablet Take 2 tablets by mouth 3 (three) times daily.   Yes [provider]  Cholecalciferol (VITAMIN D) 2000 units CAPS Take 2,000 Units by mouth daily.    Yes  [provider]  clonazePAM (KLONOPIN) 0.5 MG tablet Take 1 tablet (0.5 mg total) by mouth daily. 07/24/16  Yes Mikhail, Nita SellsMaryann, DO  Dextromethorphan-Quinidine (NUEDEXTA) 20-10 MG CAPS Take 1 capsule by mouth 2 (two) times daily.   Yes [provider]  DIETARY MANAGEMENT PRODUCT PO Take 1 Container by mouth 3 (three) times daily. Magic Cup   Yes [provider]  levothyroxine (SYNTHROID, LEVOTHROID) 150 MCG tablet Take 150 mcg by mouth daily before breakfast.   Yes [provider]  Melatonin 3 MG TABS Take 3 mg by mouth at bedtime.   Yes [provider]  senna (SENOKOT) 8.6 MG TABS tablet Take 1 tablet by mouth at bedtime.   Yes [provider]  tamsulosin (FLOMAX) 0.4 MG CAPS capsule Take 0.8 mg by mouth at bedtime.    Yes [provider]    Physical Exam: Vitals:   08/29/17 0600 08/29/17 0630 08/29/17 0640 08/29/17 0700  BP: 101/62 114/76  118/78  Pulse: 73 66  75  Resp: 19 15  (!) 24  Temp:    98.5 F (36.9 C)  TempSrc:    Rectal  SpO2: 97% 100%  99%  Weight:   52.2 kg (115 lb)   Height:   6' (1.829 m)     Constitutional: No acute distress, generalized weakness, chronically ill cachectic, Vitals:   08/29/17 0600 08/29/17 0630 08/29/17 0640 08/29/17 0700  BP: 101/62 114/76  118/78  Pulse: 73 66  75  Resp: 19 15  (!) 24  Temp:    98.5 F (36.9 C)  TempSrc:    Rectal  SpO2: 97% 100%  99%  Weight:   52.2 kg (115 lb)   Height:   6' (1.829 m)    Eyes: PERRL, lids and conjunctivae normal ENMT: Mucous membranes are dry. Posterior pharynx clear of any exudate or lesions.  Neck: normal, supple, no masses, no thyromegaly Respiratory: clear to auscultation bilaterally, no wheezing, no crackles. Normal respiratory effort. No accessory muscle use.  Cardiovascular: Regular rate and rhythm, no murmurs / rubs / gallops. No extremity edema. 2+ pedal pulses. No carotid bruits.  Abdomen: no tenderness, no masses palpated. No  hepatosplenomegaly. Bowel sounds positive.  Musculoskeletal: no clubbing / cyanosis. No joint deformity upper and lower extremities. Skin: no rashes, lesions, ulcers. No induration Neurologic: CN 2-12 grossly intact. Sensation intact, could not do full neurological evaluation due to patient's chronic debility and weakness .Psychiatric: Alert and oriented x 3.  Foley Catheter:None  Labs on Admission: I have personally reviewed following labs and imaging studies  CBC: Recent Labs  Lab 08/29/17 0457  WBC 17.5*  NEUTROABS 14.6*  HGB 14.5  HCT 44.2  MCV 96.7  PLT 199   Basic Metabolic Panel: Recent Labs  Lab 08/29/17 0457  NA 150*  K 3.8  CL 114*  CO2 24  GLUCOSE 161*  BUN 56*  CREATININE 2.08*  CALCIUM 8.9  GFR: Estimated Creatinine Clearance: 22.7 mL/min (A) (by C-G formula based on SCr of 2.08 mg/dL (H)). Liver Function Tests: Recent Labs  Lab 08/29/17 0457  AST 22  ALT 5*  ALKPHOS 83  BILITOT 0.8  PROT 8.1  ALBUMIN 3.3*   No results for input(s): LIPASE, AMYLASE in the last 168 hours. No results for input(s): AMMONIA in the last 168 hours. Coagulation Profile: No results for input(s): INR, PROTIME in the last 168 hours. Cardiac Enzymes: No results for input(s): CKTOTAL, CKMB, CKMBINDEX, TROPONINI in the last 168 hours. BNP (last 3 results) No results for input(s): PROBNP in the last 8760 hours. HbA1C: No results for input(s): HGBA1C in the last 72 hours. CBG: No results for input(s): GLUCAP in the last 168 hours. Lipid Profile: No results for input(s): CHOL, HDL, LDLCALC, TRIG, CHOLHDL, LDLDIRECT in the last 72 hours. Thyroid Function Tests: No results for input(s): TSH, T4TOTAL, FREET4, T3FREE, THYROIDAB in the last 72 hours. Anemia Panel: No results for input(s): VITAMINB12, FOLATE, FERRITIN, TIBC, IRON, RETICCTPCT in the last 72 hours. Urine analysis:    Component Value Date/Time   COLORURINE AMBER (A) 08/29/2017 0659   APPEARANCEUR CLOUDY (A)  08/29/2017 0659   LABSPEC 1.018 08/29/2017 0659   PHURINE 7.0 08/29/2017 0659   GLUCOSEU NEGATIVE 08/29/2017 0659   HGBUR SMALL (A) 08/29/2017 0659   BILIRUBINUR NEGATIVE 08/29/2017 0659   KETONESUR 5 (A) 08/29/2017 0659   PROTEINUR 100 (A) 08/29/2017 0659   NITRITE NEGATIVE 08/29/2017 0659   LEUKOCYTESUR LARGE (A) 08/29/2017 0659    Radiological Exams on Admission: Dg Chest Port 1 View  Result Date: 08/29/2017 CLINICAL DATA:  Shortness of breath. EXAM: PORTABLE CHEST 1 VIEW COMPARISON:  07/20/2016 FINDINGS: Vagal stimulator on the left. The cardiomediastinal contours are normal. Prior right basilar opacity is near completely resolved with mild residual scarring. Mild left lung base atelectasis. Pulmonary vasculature is normal. No consolidation, pleural effusion, or pneumothorax. No acute osseous abnormalities are seen. IMPRESSION: Bibasilar atelectasis or scarring. Electronically Signed   By: Rubye Oaks M.D.   On: 08/29/2017 06:57     Assessment/Plan Principal Problem:   Fever, unspecified Active Problems:   Hypothyroidism   Essential hypertension, benign   Parkinson's disease (HCC)   Urinary tract infection, site not specified   BPH (benign prostatic hyperplasia)   Aspiration pneumonia (HCC)   HCAP (healthcare-associated pneumonia)   H/O: stroke   Debility   PNA (pneumonia)  Fever/sepsis: Could be associated with pneumonia from aspiration/healthcare respiratory pneumonia or urinary tract infection.  Started with broad-spectrum antibiotics with vancomycin and cefepime.  We will follow-up cultures.  He is hemodynamically stable.  Lactic acid level normal  Healthcare associated pneumonia/aspiration pneumonia: History of recurrent aspiration pneumonia in the past   due to previous history of stroke.  Will request for speech therapy evaluation.  We will keep him n.p.o. for now.  Patient not on home oxygen.  Continue supplemental oxygen as needed.  Continue bronchodilators and  mucolytic's.  Acute kidney injury: His kidney function was normal as per last BMP on 07/24/2016.  Most likely secondary to dehydration.  We will continue IV fluids.  We will continue to monitor his kidney function.  Hypernatremia: Most likely secondary to dehydration.  We will continue half-normal saline.  He already got 2 L of bolus with normal saline.   UTI: Urinalysis suggestive of urinary tract infection but his UA is always abnormal.  Already started on antibiotics.  We will follow-up urine culture.  History of stroke/debility:  Patient has not ambulated since last 6 months due to history of stroke, chronic bilateral lower extremity weakness, rigidity from Parkinson's.But he can sit on chair unassisted.  Parkinson's disease: Follows with neurology at [facility].  Continue Sinemet.  Status post deep brain stimulator placement.  BPH: Continue tamsulosin  Hypothyroidism: Continue Synthyroid  Severe malnutrition: We will request for nutrition evaluation.  History of depression: Continue Wellbutrin.  Goals of care: Discussed with his son on phone.  Initially palliative care was involved for plan to transition his care to hospice.  Son is interested to discuss again with palliative care.  Patient is DNR.      Severity of Illness: The appropriate patient status for this patient is INPATIENT.  DVT prophylaxis: Hep [place] Code Status: DNR Family Communication: Discussed with son  consults called: Palliative care  Please Note: This patient record was dictated using Animal nutritionist. Chart creation errors have been sought, but may not always have been located. Such creation errors do not reflect on the Standard of Medical Care.   Burnadette Pop MD [facility] Pager [phone]  If 7PM-7AM, please contact night-coverage [email or web address] Password [email or web address]  08/29/2017, 9:11 AM

## 2017-08-29 NOTE — ED Provider Notes (Addendum)
[facility]-EMERGENCY DEPT Provider Note   CSN: [id number] Arrival date & time: 08/29/17  0403     History   Chief Complaint Chief Complaint  Patient presents with  . Fever  . Cough    HPI Kristopher Jennings is a 77 y.o. male.  HPI   77 yo M with h/o Parkinson's, stroke, recurrent PNA here with cough, fever. History somewhat limited 2/2 his h/o stroke, Parkinson's but per report,  Pt form [facility] after fever that started yesterday with productive cough. Temp was 102.25F this morning, and was given tylenol. He's had yellow-green sputum production. Pt also with increased O2 requirement. Breathing tx given by EMS. Pt also with ? UTI per staff. On my assessment, pt states he feels cold but o/w denies complaints. He does state he feels SOB when directly asked.  Level 5 caveat invoked as remainder of history, ROS, and physical exam limited due to patient's CVA, Parkinsons.   Past Medical History:  Diagnosis Date  . Anxiety   . Arthritis   . Ataxia   . Cauda equina syndrome (HCC)   . Depression   . Dysphagia   . Essential Tremors   . GERD (gastroesophageal reflux disease)   . Paralysis agitans (HCC) 01/12/2013  . Parkinson disease (HCC)   . Stroke (HCC)   . Thyroid disease   . UTI (lower urinary tract infection)     Patient Active Problem List   Diagnosis Date Noted  . H/O: stroke 08/29/2017  . Debility 08/29/2017  . PNA (pneumonia) 08/29/2017  . Protein-calorie malnutrition, severe 07/23/2016  . HCAP (healthcare-associated pneumonia) 07/21/2016  . Encounter for palliative care   . Goals of care, counseling/discussion   . Pressure injury of skin 03/20/2016  . Lobar pneumonia (HCC) 03/19/2016  . Sepsis, unspecified organism (HCC) 03/19/2016  . Acute encephalopathy 03/19/2016  . Aspiration pneumonia (HCC) 03/19/2016  . BPH (benign prostatic hyperplasia) 01/01/2014  . Thyroid activity decreased 10/04/2013  . Fever, unspecified 10/03/2013  .  Unspecified constipation 10/02/2013  . Other specified disease of white blood cells 10/02/2013  . Cough 05/03/2013  . Urinary tract infection, site not specified 02/26/2013  . Urinary hesitancy 02/19/2013  . Parkinson's disease (HCC) 01/12/2013  . Essential hypertension, benign 01/08/2013  . Hypertrophy of prostate without urinary obstruction and other lower urinary tract symptoms (LUTS) 01/08/2013  . Anxiety state, unspecified 01/08/2013  . Benign essential tremor 12/13/2012  . H/O urinary retention 12/13/2012  . Hypothyroidism 12/13/2012  . Insomnia 12/13/2012    Past Surgical History:  Procedure Laterality Date  . LAMINECTOMY    . SPINE SURGERY          Home Medications    Prior to Admission medications   Medication Sig Start Date End Date Taking? Authorizing Provider  acetaminophen (TYLENOL) 325 MG tablet Take 650 mg by mouth every 4 (four) hours as needed for mild pain, moderate pain, fever or headache.    Yes [provider]  acetaminophen (TYLENOL) 650 MG suppository Place 650 mg rectally every 4 (four) hours as needed for fever.   Yes [provider]  buPROPion (WELLBUTRIN SR) 200 MG 12 hr tablet Take 200 mg by mouth daily before breakfast.    Yes [provider]  carbidopa-levodopa (SINEMET IR) 25-100 MG tablet Take 2 tablets by mouth 3 (three) times daily.   Yes [provider]  Cholecalciferol (VITAMIN D) 2000 units CAPS Take 2,000 Units by mouth daily.    Yes [provider]  clonazePAM (KLONOPIN) 0.5 MG tablet Take 1 tablet (0.5 mg total) by mouth daily. 07/24/16  Yes Mikhail, Nita SellsMaryann, DO  Dextromethorphan-Quinidine (NUEDEXTA) 20-10 MG CAPS Take 1 capsule by mouth 2 (two) times daily.   Yes [provider]  DIETARY MANAGEMENT PRODUCT PO Take 1 Container by mouth 3 (three) times daily. Magic Cup   Yes [provider]  levothyroxine (SYNTHROID, LEVOTHROID) 150 MCG tablet Take 150 mcg by mouth daily before  breakfast.   Yes [provider]  Melatonin 3 MG TABS Take 3 mg by mouth at bedtime.   Yes [provider]  senna (SENOKOT) 8.6 MG TABS tablet Take 1 tablet by mouth at bedtime.   Yes [provider]  tamsulosin (FLOMAX) 0.4 MG CAPS capsule Take 0.8 mg by mouth at bedtime.    Yes [provider]    Family History No family history on file.  Social History Social History   Tobacco Use  . Smoking status: Former Games developermoker  . Smokeless tobacco: Never Used  Substance Use Topics  . Alcohol use: No  . Drug use: No     Allergies   Demerol [meperidine]; Lactose intolerance (gi); Nickel; and Oxycodone   Review of Systems Review of Systems  Unable to perform ROS: Mental status change  Constitutional: Positive for chills, fatigue and fever.  Respiratory: Positive for cough and shortness of breath.   All other systems reviewed and are negative.    Physical Exam Updated Vital Signs BP 120/62 (BP Location: Right Arm)   Pulse 79   Temp 98.4 F (36.9 C) (Oral)   Resp (!) 29   Ht 6' (1.829 m)   Wt 52.2 kg (115 lb)   SpO2 95%   BMI 15.60 kg/m   Physical Exam  Constitutional: He is oriented to person, place, and time. He appears well-developed and well-nourished. He has a sickly appearance. No distress.  HENT:  Head: Normocephalic and atraumatic.  Mildly dry MM  Eyes: Conjunctivae are normal.  Neck: Neck supple.  Cardiovascular: Normal rate, regular rhythm and normal heart sounds. Exam reveals no friction rub.  No murmur heard. Pulmonary/Chest: Effort normal. Tachypnea noted. No respiratory distress. He has no wheezes. He has rales in the right lower field and the left lower field.  Abdominal: He exhibits no distension.  Musculoskeletal: He exhibits no edema.  Neurological: He is alert and oriented to person, place, and time. He exhibits normal muscle tone.  Skin: Skin is warm. Capillary refill takes less than 2 seconds.  Psychiatric: He has a  normal mood and affect.  Nursing note and vitals reviewed.    ED Treatments / Results  Labs (all labs ordered are listed, but only abnormal results are displayed) Labs Reviewed  COMPREHENSIVE METABOLIC PANEL - Abnormal; Notable for the following components:      Result Value   Sodium 150 (*)    Chloride 114 (*)    Glucose, Bld 161 (*)    BUN 56 (*)    Creatinine, Ser 2.08 (*)    Albumin 3.3 (*)    ALT 5 (*)    GFR calc non Af Amer 29 (*)    GFR calc Af Amer 34 (*)    All other components within normal limits  CBC WITH DIFFERENTIAL/PLATELET - Abnormal; Notable for the following components:   WBC 17.5 (*)    Neutro Abs 14.6 (*)    Monocytes Absolute 1.7 (*)    All other components within normal limits  URINALYSIS, ROUTINE W  REFLEX MICROSCOPIC - Abnormal; Notable for the following components:   Color, Urine AMBER (*)    APPearance CLOUDY (*)    Hgb urine dipstick SMALL (*)    Ketones, ur 5 (*)    Protein, ur 100 (*)    Leukocytes, UA LARGE (*)    WBC, UA >50 (*)    Bacteria, UA MANY (*)    All other components within normal limits  CULTURE, BLOOD (ROUTINE X 2)  CULTURE, BLOOD (ROUTINE X 2)  URINE CULTURE  MRSA PCR SCREENING  I-STAT CG4 LACTIC ACID, ED    EKG EKG Interpretation  Date/Time:  Monday August 29 2017 06:37:30 EDT Ventricular Rate:  133 PR Interval:    QRS Duration: 58 QT Interval:  410 QTC Calculation: 537 R Axis:   -41 Text Interpretation:  Poor quality data, interpretation may be affected Atrial fibrillation Left anterior fascicular block Posterior infarct, old Repol abnrm suggests ischemia, anterolateral Prolonged QT interval Artifact in lead(s) I II III aVR aVL aVF V1 V2 V3 V4 V5 V6 and baseline wander in lead(s) V6 Confirmed by Rolan Bucco [phone]) on 08/30/2017 9:09:19 AM   Radiology Dg Chest Port 1 View  Result Date: 08/29/2017 CLINICAL DATA:  Shortness of breath. EXAM: PORTABLE CHEST 1 VIEW COMPARISON:  07/20/2016 FINDINGS: Vagal stimulator on  the left. The cardiomediastinal contours are normal. Prior right basilar opacity is near completely resolved with mild residual scarring. Mild left lung base atelectasis. Pulmonary vasculature is normal. No consolidation, pleural effusion, or pneumothorax. No acute osseous abnormalities are seen. IMPRESSION: Bibasilar atelectasis or scarring. Electronically Signed   By: Rubye Oaks M.D.   On: 08/29/2017 06:57    Procedures .Critical Care Performed by: Shaune Pollack, MD Authorized by: Shaune Pollack, MD   Critical care provider statement:    Critical care time (minutes):  35   Critical care time was exclusive of:  Separately billable procedures and treating other patients and teaching time   Critical care was necessary to treat or prevent imminent or life-threatening deterioration of the following conditions:  Cardiac failure, circulatory failure and sepsis   Critical care was time spent personally by me on the following activities:  Development of treatment plan with patient or surrogate, discussions with consultants, evaluation of patient's response to treatment, examination of patient, obtaining history from patient or surrogate, ordering and performing treatments and interventions, ordering and review of laboratory studies, ordering and review of radiographic studies, pulse oximetry, re-evaluation of patient's condition and review of old charts   I assumed direction of critical care for this patient from another provider in my specialty: no     (including critical care time)  Medications Ordered in ED Medications  dextromethorphan-guaiFENesin (MUCINEX DM) 30-600 MG per 12 hr tablet 1 tablet (1 tablet Oral Not Given 08/29/17 0931)  buPROPion (WELLBUTRIN SR) 12 hr tablet 200 mg (200 mg Oral Given 08/29/17 1317)  carbidopa-levodopa (SINEMET IR) 25-100 MG per tablet immediate release 2 tablet (2 tablets Oral Given 08/29/17 1454)  clonazePAM (KLONOPIN) tablet 0.5 mg (0.5 mg Oral Given  08/29/17 1317)  levothyroxine (SYNTHROID, LEVOTHROID) tablet 150 mcg (150 mcg Oral Given 08/29/17 1317)  Melatonin TABS 3 mg (has no administration in time range)  senna (SENOKOT) tablet 8.6 mg (has no administration in time range)  tamsulosin (FLOMAX) capsule 0.8 mg (has no administration in time range)  heparin injection 5,000 Units (5,000 Units Subcutaneous Given 08/29/17 1319)  0.45 % sodium chloride infusion ( Intravenous New Bag/Given 08/29/17 1236)  ceFEPIme (MAXIPIME)  1 g in sodium chloride 0.9 % 100 mL IVPB (has no administration in time range)  vancomycin (VANCOCIN) IVPB 750 mg/150 ml premix (has no administration in time range)  ipratropium-albuterol (DUONEB) 0.5-2.5 (3) MG/3ML nebulizer solution 3 mL (has no administration in time range)  ceFEPIme (MAXIPIME) 2 g in sodium chloride 0.9 % 100 mL IVPB (0 g Intravenous Stopped 08/29/17 0530)  vancomycin (VANCOCIN) IVPB 1000 mg/200 mL premix (0 mg Intravenous Stopped 08/29/17 0612)  sodium chloride 0.9 % bolus 1,000 mL (0 mLs Intravenous Stopped 08/29/17 0717)  sodium chloride 0.9 % bolus 1,000 mL (0 mLs Intravenous Stopped 08/29/17 0612)     Initial Impression / Assessment and Plan / ED Course  I have reviewed the triage vital signs and the nursing notes.  Pertinent labs & imaging results that were available during my care of the patient were reviewed by me and considered in my medical decision making (see chart for details).     77 yo M here with fever to 102.7 at facility, increased RR, reported hypoxia requiring 2-3 L [place]. H/o recurrent aspirations. Pt does have bibasilar rales on exam, cough, and increased WOB. Abdomen is soft. Labs show leukocytosis with left shift, also significant likely pre-renal AKI. LA normal. Code sepsis initiated, pt given IVF and broad-spectrum ABX. CXR with bibasilar atelectasis - suspect he's had a recurrent aspiration PNA. UA is pending. Given fever, increased WOB, leukocytosis, AKI, will admit.  Final  Clinical Impressions(s) / ED Diagnoses   Final diagnoses:  Sepsis, due to unspecified organism [facility])    ED Discharge Orders    None       Shaune Pollack, MD 08/29/17 1532    Shaune Pollack, MD 09/12/17 1053

## 2017-08-29 NOTE — Evaluation (Signed)
Clinical/Bedside Swallow Evaluation Patient Details  Name: Atticus Wedin MRN: [id number] Date of Birth: [birth year]  Today's Date: 08/29/2017 Time: SLP Start Time (ACUTE ONLY): 1215 SLP Stop Time (ACUTE ONLY): 1245 SLP Time Calculation (min) (ACUTE ONLY): 30 min  Past Medical History:  Past Medical History:  Diagnosis Date  . Anxiety   . Arthritis   . Ataxia   . Cauda equina syndrome (HCC)   . Depression   . Dysphagia   . Essential Tremors   . GERD (gastroesophageal reflux disease)   . Paralysis agitans (HCC) 01/12/2013  . Parkinson disease (HCC)   . Stroke (HCC)   . Thyroid disease   . UTI (lower urinary tract infection)    Past Surgical History:  Past Surgical History:  Procedure Laterality Date  . LAMINECTOMY    . SPINE SURGERY     HPI:  77 year old male admitted 08/29/17 from SNF with fever, PNA, UTI. PMH: CVA, Parkinson's disease, recurrent aspiration PNA, decreased mobility, BPH, hypothyroid, GERD, dysphagia   Assessment / Plan / Recommendation Clinical Impression  Upon arrival of SLP, pt was noted to be awake. Wet voice quality noted. SLP set up suction and provided oral care. Thick secretions removed via suction. Pt unable to follow commands consistently for cough, throat clear, and assessment of oral motor strength and function. Voice quality noted to be clear after oral care.   Pt was given individual small ice chips and 1/2 tsp boluses of puree. Delayed congested, nonproductive cough was noted following po trials. Pt has undergone several MBS over the past 2 years. The most recent was completed in May 2018, at which time pt/son indicated that pt did not want feeding tube placement.  "Clear liquids with known aspiration risk" was recommended at that time. Pt reports he was eating soft (NOT pureed) foods and honey thick liquids prior to admit. This is in contradiction to MBS results from last year, stating thinner consistencies would likely be better tolerated due to  decreased pharyngeal residue.   At this time, pt continues to present with significantly high risk for aspiration. Recommend Palliative Care consult to establish GOC, including allowable po consistencies with known aspiration risk. Critical po meds should be crushed and provided in puree, immediately following oral care. Ice chips may be given one at a time following oral care, to provide comfort and moisture, otherwise, pt is recommended to be NPO.  SLP will continue to follow to provide education. MD and RN were informed of results and recommendations.   SLP Visit Diagnosis: Dysphagia, unspecified (R13.10)    Aspiration Risk  Severe aspiration risk;Risk for inadequate nutrition/hydration    Diet Recommendation NPO   Medication Administration: Crushed with puree    Other  Recommendations Oral Care Recommendations: Oral care prior to ice chip/H20   Follow up Recommendations (TBD)      Frequency and Duration min 1 x/week  1 week;2 weeks       Prognosis Prognosis for Safe Diet Advancement: Guarded Barriers to Reach Goals: Cognitive deficits;Severity of deficits      Swallow Study   General Date of Onset: 08/29/17 HPI: 77 year old male admitted 08/29/17 from SNF with fever, PNA, UTI. PMH: CVA, Parkinson's disease, recurrent aspiration PNA, decreased mobility, BPH, hypothyroid, GERD, dysphagia Type of Study: Bedside Swallow Evaluation Previous Swallow Assessment: MBS 07/2016 - mod-sev dysphagia, no tube feeding desired at that time. Diet Prior to this Study: NPO Temperature Spikes Noted: Yes(102.7 on admit) Respiratory Status: Nasal cannula History of Recent  Intubation: No Behavior/Cognition: Alert;Cooperative Oral Cavity Assessment: (stringy secretions noted) Oral Care Completed by SLP: Yes Oral Cavity - Dentition: Poor condition;Missing dentition Self-Feeding Abilities: Total assist Patient Positioning: Upright in bed Baseline Vocal Quality: Wet Volitional Cough: Cognitively  unable to elicit Volitional Swallow: Unable to elicit    Oral/Motor/Sensory Function Overall Oral Motor/Sensory Function: (generalized weakness)   Ice Chips Ice chips: Within functional limits Presentation: Spoon   Thin Liquid Thin Liquid: Not tested    Nectar Thick Nectar Thick Liquid: Not tested   Honey Thick Honey Thick Liquid: Not tested   Puree Puree: Impaired Presentation: Spoon Pharyngeal Phase Impairments: Suspected delayed Swallow;Cough - Delayed   Solid   GO   Solid: Not tested       Celia B. Murvin NatalBueche, [facility] Rehabilitation HospitalMSP, CCC-SLP Speech Language Pathologist [phone]  Leigh AuroraBueche, Celia Brown 08/29/2017,1:07 PM

## 2017-08-30 DIAGNOSIS — R509 Fever, unspecified: Secondary | ICD-10-CM

## 2017-08-30 LAB — URINE CULTURE
CULTURE: NO GROWTH
SPECIAL REQUESTS: NORMAL

## 2017-08-30 LAB — BASIC METABOLIC PANEL
ANION GAP: 10 (ref 5–15)
BUN: 34 mg/dL — ABNORMAL HIGH (ref 6–20)
CO2: 22 mmol/L (ref 22–32)
Calcium: 9.1 mg/dL (ref 8.9–10.3)
Chloride: 123 mmol/L — ABNORMAL HIGH (ref 101–111)
Creatinine, Ser: 1.27 mg/dL — ABNORMAL HIGH (ref 0.61–1.24)
GFR calc Af Amer: >60 mL/min (ref >60)
GFR, EST NON AFRICAN AMERICAN: 54 mL/min — AB (ref >60)
GLUCOSE: 100 mg/dL — AB (ref 65–99)
POTASSIUM: 3.5 mmol/L (ref 3.5–5.1)
SODIUM: 155 mmol/L — AB (ref 135–145)

## 2017-08-30 LAB — CBC
HEMATOCRIT: 39.5 % (ref 39.0–52.0)
HEMOGLOBIN: 12.8 g/dL — AB (ref 13.0–17.0)
MCH: 31.5 pg (ref 26.0–34.0)
MCHC: 32.4 g/dL (ref 30.0–36.0)
MCV: 97.3 fL (ref 78.0–100.0)
Platelets: 176 10*3/uL (ref 150–400)
RBC: 4.06 MIL/uL — ABNORMAL LOW (ref 4.22–5.81)
RDW: 13.5 % (ref 11.5–15.5)
WBC: 15.6 10*3/uL — AB (ref 4.0–10.5)

## 2017-08-30 MED ORDER — GUAIFENESIN-DM 100-10 MG/5ML PO SYRP
5.0000 mL | ORAL_SOLUTION | Freq: Four times a day (QID) | ORAL | Status: DC
  Administered 2017-08-30 – 2017-09-03 (×14): 5 mL via ORAL
  Filled 2017-08-30 (×16): qty 10

## 2017-08-30 NOTE — Clinical Social Work Note (Signed)
Clinical Social Work Assessment  Patient Details  Name: Barb MerinoRussell Wessner MRN: [id number] Date of Birth: 1941/01/18  Date of referral:  08/30/17               Reason for consult:  (admitted from facility)                Permission sought to share information with:  Family Supports Permission granted to share information::     Name::     son Annett GulaRuss Krolak  Agency[facility]  [facility]  Relationship::     Contact Information:     Housing/Transportation Living arrangements for the past 2 months:  Skilled Building surveyorursing Facility Source of Information:  Adult Children, Facility Patient Interpreter Needed:  None Criminal Activity/Legal Involvement Pertinent to Current Situation/Hospitalization:  No - Comment as needed Significant Relationships:  Adult Children, Merchandiser, retailCommunity Support Lives with:  Facility Resident Do you feel safe going back to the place where you live?  Yes Need for family participation in patient care:  Yes (Comment)(pt with dementia)  Care giving concerns:  [facility]. Has dementia but at baseline knows caretakers, surroundings, and situation per son. Has history of Parkinson's and stroke. Is immobile at baseline- son states staff transfers him to a chair for a few hours each day. Requires staff to feed and bathe him Currently admitted for pneumonia.   Social Worker assessment / plan:  CSW consulted to assist with disposition as pt is [facility] Is long term care resident there. Pt unable to speak and participate at this time- spoke with son via phone and gathered care needs above. Plan to return to SNF when stable for DC. [facility]. Will provide updated FL2.  Employment status:  Retired Health and safety inspectornsurance information:  Medicare PT Recommendations:  Not assessed at this time(pt immobile at baseline) Information / Referral to community resources:     Patient/Family's Response to care:   Son appreciative  Patient/Family's Understanding of and Emotional Response to Diagnosis, Current Treatment, and Prognosis:  Unable to assess pt's understanding. Son shows good understanding of treatment, describing to CSW and was good historian of care needs. Emotionally appropriate  Emotional Assessment Appearance:  Appears stated age Attitude/Demeanor/Rapport:  (UTA- sleeping) Affect (typically observed):    Orientation:  (not oriented) Alcohol / Substance use:  Not Applicable Psych involvement (Current and /or in the community):  No (Comment)  Discharge Needs  Concerns to be addressed:  Care Coordination Readmission within the last 30 days:  No Current discharge risk:  (assessing) Barriers to Discharge:  Continued Medical Work up   Terex CorporationMeghan R Arriyanna Mersch, LCSW 08/30/2017, 9:34 AM  [phone]

## 2017-08-30 NOTE — Progress Notes (Signed)
Initial Nutrition Assessment  DOCUMENTATION CODES:   Severe malnutrition in context of chronic illness, Underweight  INTERVENTION:   Once diet is advanced, provide Ensure Enlive po BID, each supplement provides 350 kcal and 20 grams of protein  NUTRITION DIAGNOSIS:   Severe Malnutrition related to (h/o stroke, Parkinson's) as evidenced by severe fat depletion, severe muscle depletion.  GOAL:   Patient will meet greater than or equal to 90% of their needs  MONITOR:   Diet advancement, Labs, Weight trends, I & O's  REASON FOR ASSESSMENT:   Consult Assessment of nutrition requirement/status  ASSESSMENT:   77 y.o. male with medical history significant of stroke, Parkinson's disease, recurrent aspiration pneumonia, inability to walk due to rigidity, BPH, hypothyroidism who presented to the emergency department after he was sent from his skilled nursing facility for the evaluation of fever and possible pneumonia.  Patient unable to provide any history given dementia. No family in room at time of visit.  Pt NPO currently. SLP evaluated 6/10: recommends pt to remain NPO. Pt with severe aspiration risk. Palliative care consulted for GOC. Per MST screen, pt was following a pureed diet PTA.   Per chart review, no weight loss. Weight is the same as 1 year ago.   Medications: Senokot tablet daily Labs reviewed: Elevated Na   NUTRITION - FOCUSED PHYSICAL EXAM:    Most Recent Value  Orbital Region  Moderate depletion  Upper Arm Region  Severe depletion  Thoracic and Lumbar Region  Unable to assess  Buccal Region  Moderate depletion  Temple Region  Severe depletion  Clavicle Bone Region  Severe depletion  Clavicle and Acromion Bone Region  Severe depletion  Scapular Bone Region  Unable to assess  Dorsal Hand  Unable to assess  Patellar Region  Unable to assess  Anterior Thigh Region  Unable to assess  Posterior Calf Region  Unable to assess  Edema (RD Assessment)  None        Diet Order:   Diet Order           Diet NPO time specified  Diet effective now          EDUCATION NEEDS:   Not appropriate for education at this time  Skin:  Skin Assessment: Reviewed RN Assessment  Last BM:  6/10  Height:   Ht Readings from Last 1 Encounters:  08/29/17 6' (1.829 m)    Weight:   Wt Readings from Last 1 Encounters:  08/29/17 115 lb (52.2 kg)    Ideal Body Weight:  80.9 kg  BMI:  Body mass index is 15.6 kg/m.  Estimated Nutritional Needs:   Kcal:  1300-1500  Protein:  60-70g  Fluid:  1.5L/day   Tilda FrancoLindsey Silvano Garofano, MS, RD, LDN [facility] Clinical Dietitian Pager: [phone] After Hours Pager: [phone]

## 2017-08-30 NOTE — Consult Note (Signed)
Consultation Note Date: 08/30/2017   Patient Name: Kristopher Jennings  DOB: 06/24/1940  MRN: [id number]  Age / Sex: 77 y.o., male  PCP: Renford Dills, MD Referring Physician: Penny Pia, MD  Reason for Consultation: Establishing goals of care  HPI/Patient Profile: 77 y.o. male  admitted on 08/29/2017   Clinical Assessment and Goals of Care:  77 year old gentleman, resident of [facility], history of stroke, Parkinson's disease, recurrent aspiration pneumonia, essentially bed bound at his nursing facility due to rigidity, generalized weakness, admitted to the hospital from skilled nursing facility to hospital medicine service with fever sepsis possibly aspiration versus healthcare associated pneumonia, possibly urinary tract infection, some element of acute kidney injury, hyper natremia.   The patient was seen and evaluated by palliative services in May 2018. At that time discussions were held about recurrent irreversible nature of aspiration given patient's neurological conditions. At that time, recommendations were made for addition of hospice as an extra layer of support at the patient skilled nursing facility.  A palliative consultation has been requested in this hospitalization for ongoing goals of care discussions.  Patient is an elderly gentleman resting in bed. He does not appear to be in any acute distress. His son is present at the bedside. He states that essentially, over the course of the past year, the patient has remained the same. He has remained in a bed bound condition. He used to enjoy talking and interacting with other residents, has not been able to do much of that for the past several months. Patient however drinks a sufficient liquids at the nursing facility and is able to maintain his nutritional status somewhat. Son fully understands ongoing risk of aspiration due to the patient's stroke and  Parkinson's.  Son states that the patient's presentation, current quality of life is worth maintaining/preserving. We discussed about antibiotics and appropriate, safest possible oral diet to be resumed. Son is agreeable.discussed with speech language pathology. We will start with dysphagia 3 diet, speech therapist recommended thin liquids rather than honey thick liquids due to ongoing discomfort aspiration.  We  discussed about differences between hospice and palliative services with patient's son. Discussed about decline trajectory in stroke/Parkinson's, recurrent aspiration pneumonias, functional and cognitive decline. All of his questions answered to the best of my ability. See additional discussions/recommendations below. Thank you for the consult.  HCPOA  son Baltasar Twilley [phone].   SUMMARY OF RECOMMENDATIONS   1. Initiate Dysphagia 3 diet with thin liquids, patient and son understand ongoing aspiration risk, discussed with SLP colleagues. Follow aspiration precautions.  2. Continue antibiotics and current course. 3. Recommend [facility] with palliative services following after discharge.   Code Status/Advance Care Planning:  DNR    Symptom Management:    as above   Palliative Prophylaxis:  Bowel Regimen   Psycho-social/Spiritual:   Desire for further Chaplaincy support:yes  Additional Recommendations: Caregiving  Support/Resources  Prognosis:   < 12 months  Discharge Planning: Skilled Nursing Facility for rehab with Palliative care service follow-up  Primary Diagnoses: Present on Admission: . Hypothyroidism . Parkinson's disease (HCC) . BPH (benign prostatic hyperplasia) . Fever, unspecified . Aspiration pneumonia (HCC) . HCAP (healthcare-associated pneumonia) . Urinary tract infection, site not specified . PNA (pneumonia)   I have reviewed the medical record, interviewed the patient and family, and examined the patient. The following  aspects are pertinent.  Past Medical History:  Diagnosis Date  . Anxiety   . Arthritis   . Ataxia   . Cauda equina syndrome (HCC)   . Depression   . Dysphagia   . Essential Tremors   . GERD (gastroesophageal reflux disease)   . Paralysis agitans (HCC) 01/12/2013  . Parkinson disease (HCC)   . Stroke (HCC)   . Thyroid disease   . UTI (lower urinary tract infection)    Social History   Socioeconomic History  . Marital status: Divorced    Spouse name: Not on file  . Number of children: Not on file  . Years of education: Not on file  . Highest education level: Not on file  Occupational History  . Not on file  Social Needs  . Financial resource strain: Not on file  . Food insecurity:    Worry: Not on file    Inability: Not on file  . Transportation needs:    Medical: Not on file    Non-medical: Not on file  Tobacco Use  . Smoking status: Former Games developer  . Smokeless tobacco: Never Used  Substance and Sexual Activity  . Alcohol use: No  . Drug use: No  . Sexual activity: Never  Lifestyle  . Physical activity:    Days per week: Not on file    Minutes per session: Not on file  . Stress: Not on file  Relationships  . Social connections:    Talks on phone: Not on file    Gets together: Not on file    Attends religious service: Not on file    Active member of club or organization: Not on file    Attends meetings of clubs or organizations: Not on file    Relationship status: Not on file  Other Topics Concern  . Not on file  Social History Narrative  . Not on file   No family history on file. Scheduled Meds: . buPROPion  200 mg Oral QAC breakfast  . carbidopa-levodopa  2 tablet Oral TID  . clonazePAM  0.5 mg Oral Daily  . dextromethorphan-guaiFENesin  1 tablet Oral BID  . heparin  5,000 Units Subcutaneous Q8H  . levothyroxine  75 mcg Intravenous Daily  . Melatonin  3 mg Oral QHS  . senna  1 tablet Oral QHS  . tamsulosin  0.8 mg Oral QHS   Continuous  Infusions: . sodium chloride 75 mL/hr at 08/29/17 2352  . ceFEPime (MAXIPIME) IV Stopped (08/30/17 0611)   PRN Meds:.ipratropium-albuterol, morphine injection Medications Prior to Admission:  Prior to Admission medications   Medication Sig Start Date End Date Taking? Authorizing Provider  acetaminophen (TYLENOL) 325 MG tablet Take 650 mg by mouth every 4 (four) hours as needed for mild pain, moderate pain, fever or headache.    Yes [provider]  acetaminophen (TYLENOL) 650 MG suppository Place 650 mg rectally every 4 (four) hours as needed for fever.   Yes [provider]  buPROPion (WELLBUTRIN SR) 200 MG 12 hr tablet Take 200 mg by mouth daily before breakfast.    Yes [provider]  carbidopa-levodopa (SINEMET IR) 25-100 MG tablet Take 2  tablets by mouth 3 (three) times daily.   Yes [provider]  Cholecalciferol (VITAMIN D) 2000 units CAPS Take 2,000 Units by mouth daily.    Yes [provider]  clonazePAM (KLONOPIN) 0.5 MG tablet Take 1 tablet (0.5 mg total) by mouth daily. 07/24/16  Yes Mikhail, Nita SellsMaryann, DO  Dextromethorphan-Quinidine (NUEDEXTA) 20-10 MG CAPS Take 1 capsule by mouth 2 (two) times daily.   Yes [provider]  DIETARY MANAGEMENT PRODUCT PO Take 1 Container by mouth 3 (three) times daily. Magic Cup   Yes [provider]  levothyroxine (SYNTHROID, LEVOTHROID) 150 MCG tablet Take 150 mcg by mouth daily before breakfast.   Yes [provider]  Melatonin 3 MG TABS Take 3 mg by mouth at bedtime.   Yes [provider]  senna (SENOKOT) 8.6 MG TABS tablet Take 1 tablet by mouth at bedtime.   Yes [provider]  tamsulosin (FLOMAX) 0.4 MG CAPS capsule Take 0.8 mg by mouth at bedtime.    Yes [provider]   Allergies  Allergen Reactions  . Demerol [Meperidine] Other (See Comments)    Reaction:  Unknown   . Lactose Intolerance (Gi) Other (See Comments)    Reaction:  Unknown     . Nickel Other (See Comments)    Reaction:  Unknown   . Oxycodone Other (See Comments)    Reaction:  Unknown    Review of Systems +cough +weakness  Physical Exam Frail thin gentleman Resting in bed Few coarse scattered breath sounds, sounds congested when attempts to clear throat S1 S2 Abdomen soft No edema Awake, answers questions appropriately  Vital Signs: BP (!) 149/79 (BP Location: Left Arm)   Pulse (!) 103   Temp 99.7 F (37.6 C) (Oral)   Resp (!) 36   Ht 6' (1.829 m)   Wt 52.2 kg (115 lb)   SpO2 94%   BMI 15.60 kg/m  Pain Scale: 0-10   Pain Score: 0-No pain   SpO2: SpO2: 94 % O2 Device:SpO2: 94 % O2 Flow Rate: .O2 Flow Rate (L/min): 3 L/min  IO: Intake/output summary:   Intake/Output Summary (Last 24 hours) at 08/30/2017 1415 Last data filed at 08/30/2017 0500 Gross per 24 hour  Intake 1080 ml  Output 1450 ml  Net -370 ml   PPS 40%  LBM: Last BM Date: 08/29/17 Baseline Weight: Weight: 52.2 kg (115 lb) Most recent weight: Weight: 52.2 kg (115 lb)     Palliative Assessment/Data:     Time In:  1300 Time Out:  1400 Time Total:  60 min  Greater than 50%  of this time was spent counseling and coordinating care related to the above assessment and plan.  Signed by: Rosalin HawkingZeba Tej Murdaugh, MD  [phone]  Please contact Palliative Medicine Team phone at [phone] for questions and concerns.  For individual provider: See Loretha StaplerAmion

## 2017-08-30 NOTE — Progress Notes (Addendum)
PROGRESS NOTE    Kristopher Jennings  RUE:[id number] DOB: 1940-04-14 DOA: 08/29/2017 PCP: Renford Dills, MD    Brief Narrative:  Addendum: 77 y.o. male with medical history significant of stroke, Parkinson's disease, recurrent aspiration pneumonia, inability to walk due to rigidity, BPH, hypothyroidism who presented to the emergency department after he was sent from his skilled nursing facility for the evaluation of fever and possible pneumonia.    Assessment & Plan:   Principal Problem:   Fever, unspecified - suspect 2ary to either pulmonary  Or urinary source or both. Improving on cefepime - once patient defervescence for 24-hour.  We will plan on narrowing antibiotic coverage.  Aspiration pneumonia -Patient has history of recurrence in the past secondary to stroke.  Speech therapy currently evaluating.  Patient on aspiration precautions per my discussion with nursing. -Palliative consulted secondary to multiple many prior admissions  Active Problems:   Hypothyroidism - synthroid on board, stable    Parkinson's disease (HCC) - stable continue prior to admission medication regimen.    Urinary tract infection, site not specified -To new current antibiotic regimen -Despite suspicious urinalysis urine culture negative.    BPH (benign prostatic hyperplasia) - stable currently    H/O: stroke -Most likely contributing to difficulty swallowing    Debility -Recommend physical therapy   DVT prophylaxis: Heparin Code Status: Full Family Communication: none at bedside. Disposition Plan: Pending resolution of fevers on antibiotics   Consultants:   Palliative care   Procedures: none   Antimicrobials: Cefepime   Subjective: The patient has no new complaints today.  Objective: Vitals:   08/30/17 0259 08/30/17 0554 08/30/17 0646 08/30/17 1418  BP:  (!) 149/79  130/75  Pulse:  (!) 103  87  Resp:  (!) 36  18  Temp: 99.8 F (37.7 C) (!) 100.6 F (38.1 C) 99.7 F  (37.6 C) 99.7 F (37.6 C)  TempSrc: Oral Oral Oral Oral  SpO2:  94%  94%  Weight:      Height:        Intake/Output Summary (Last 24 hours) at 08/30/2017 1444 Last data filed at 08/30/2017 0500 Gross per 24 hour  Intake 1080 ml  Output 1450 ml  Net -370 ml   Filed Weights   08/29/17 0640  Weight: 52.2 kg (115 lb)    Examination:  General exam: Appears calm and comfortable  Respiratory system: Clear to auscultation. Respiratory effort normal. Cardiovascular system: S1 & S2 heard, RRR. No JVD, murmurs, rubs, gallops or clicks. No pedal edema. Gastrointestinal system: Abdomen is nondistended, soft and nontender. No organomegaly or masses felt. Normal bowel sounds heard. Central nervous system: No facial asymmetry limited cooperation secondary to suspected dementia or altered mental status secondary to UTI Extremities: Warm, no cyanosis Skin: No rashes, lesions or ulcers, on limited exam Psychiatry: Mood & affect appropriate.     Data Reviewed: I have personally reviewed following labs and imaging studies  CBC: Recent Labs  Lab 08/29/17 0457 08/30/17 0333  WBC 17.5* 15.6*  NEUTROABS 14.6*  --   HGB 14.5 12.8*  HCT 44.2 39.5  MCV 96.7 97.3  PLT 199 176   Basic Metabolic Panel: Recent Labs  Lab 08/29/17 0457 08/30/17 0333  NA 150* 155*  K 3.8 3.5  CL 114* 123*  CO2 24 22  GLUCOSE 161* 100*  BUN 56* 34*  CREATININE 2.08* 1.27*  CALCIUM 8.9 9.1   GFR: Estimated Creatinine Clearance: 37.1 mL/min (A) (by C-G formula based on SCr of 1.27 mg/dL (H)). Liver  Function Tests: Recent Labs  Lab 08/29/17 0457  AST 22  ALT 5*  ALKPHOS 83  BILITOT 0.8  PROT 8.1  ALBUMIN 3.3*   No results for input(s): LIPASE, AMYLASE in the last 168 hours. No results for input(s): AMMONIA in the last 168 hours. Coagulation Profile: No results for input(s): INR, PROTIME in the last 168 hours. Cardiac Enzymes: No results for input(s): CKTOTAL, CKMB, CKMBINDEX, TROPONINI in the  last 168 hours. BNP (last 3 results) No results for input(s): PROBNP in the last 8760 hours. HbA1C: No results for input(s): HGBA1C in the last 72 hours. CBG: No results for input(s): GLUCAP in the last 168 hours. Lipid Profile: No results for input(s): CHOL, HDL, LDLCALC, TRIG, CHOLHDL, LDLDIRECT in the last 72 hours. Thyroid Function Tests: No results for input(s): TSH, T4TOTAL, FREET4, T3FREE, THYROIDAB in the last 72 hours. Anemia Panel: No results for input(s): VITAMINB12, FOLATE, FERRITIN, TIBC, IRON, RETICCTPCT in the last 72 hours. Sepsis Labs: Recent Labs  Lab 08/29/17 0511  LATICACIDVEN 1.59    Recent Results (from the past 240 hour(s))  Blood Culture (routine x 2)     Status: None (Preliminary result)   Collection Time: 08/29/17  4:57 AM  Result Value Ref Range Status   Specimen Description   Final    RIGHT ANTECUBITAL Performed at [facility], [address], [place], [place] [address]    Special Requests   Final    BOTTLES DRAWN AEROBIC AND ANAEROBIC Blood Culture adequate volume Performed at [facility], [address], [place], [place] [address]    Culture   Final    NO GROWTH 1 DAY Performed at [facility], [address] [place], [place] [address]    Report Status PENDING  Incomplete  Blood Culture (routine x 2)     Status: None (Preliminary result)   Collection Time: 08/29/17  4:57 AM  Result Value Ref Range Status   Specimen Description   Final    BLOOD RIGHT ARM Performed at [facility], [address], [place], [place] [address]    Special Requests   Final    BOTTLES DRAWN AEROBIC AND ANAEROBIC Blood Culture adequate volume Performed at [facility], [address], [place], [place] [address]    Culture   Final    NO GROWTH 1 DAY Performed at [facility], [address], [place], [place] [address]    Report Status PENDING  Incomplete  Urine culture      Status: None   Collection Time: 08/29/17  6:59 AM  Result Value Ref Range Status   Specimen Description   Final    URINE, CLEAN CATCH Performed at [facility], [address] [place], [place] [address]    Special Requests   Final    Normal Performed at [facility], [address], [place], [place] [address]    Culture   Final    NO GROWTH Performed at [facility], [address] [place], [place] [address]    Report Status 08/30/2017 FINAL  Final  MRSA PCR Screening     Status: None   Collection Time: 08/29/17  1:51 PM  Result Value Ref Range Status   MRSA by PCR NEGATIVE NEGATIVE Final    Comment:        The GeneXpert MRSA Assay (FDA approved for NASAL specimens only), is one component of a comprehensive MRSA colonization surveillance  program. It is not intended to diagnose MRSA infection nor to guide or monitor treatment for MRSA infections. Performed at [facility], [address] [place], [place] [id number]      Radiology Studies: Dg Chest Port 1 View  Result Date: 08/29/2017 CLINICAL DATA:  Shortness of breath. EXAM: PORTABLE CHEST 1 VIEW COMPARISON:  07/20/2016 FINDINGS: Vagal stimulator on the left. The cardiomediastinal contours are normal. Prior right basilar opacity is near completely resolved with mild residual scarring. Mild left lung base atelectasis. Pulmonary vasculature is normal. No consolidation, pleural effusion, or pneumothorax. No acute osseous abnormalities are seen. IMPRESSION: Bibasilar atelectasis or scarring. Electronically Signed   By: Rubye OaksMelanie  Ehinger M.D.   On: 08/29/2017 06:57    Scheduled Meds: . buPROPion  200 mg Oral QAC breakfast  . carbidopa-levodopa  2 tablet Oral TID  . clonazePAM  0.5 mg Oral Daily  . dextromethorphan-guaiFENesin  1 tablet Oral BID  . heparin  5,000 Units Subcutaneous Q8H  . levothyroxine  75 mcg Intravenous Daily  . Melatonin  3 mg Oral QHS  .  senna  1 tablet Oral QHS  . tamsulosin  0.8 mg Oral QHS   Continuous Infusions: . sodium chloride 75 mL/hr at 08/29/17 2352  . ceFEPime (MAXIPIME) IV Stopped (08/30/17 [id number])     LOS: 1 day    Time spent: 35 minutes  Penny Piarlando Gregoria Selvy, MD [facility] Pager [phone]  If 7PM-7AM, please contact night-coverage [email or web address] Password [email or web address] 08/30/2017, 2:44 PM

## 2017-08-31 ENCOUNTER — Encounter (HOSPITAL_COMMUNITY): Payer: Self-pay

## 2017-08-31 LAB — CBC
HCT: 39 % (ref 39.0–52.0)
Hemoglobin: 12.7 g/dL — ABNORMAL LOW (ref 13.0–17.0)
MCH: 31.5 pg (ref 26.0–34.0)
MCHC: 32.6 g/dL (ref 30.0–36.0)
MCV: 96.8 fL (ref 78.0–100.0)
PLATELETS: 188 10*3/uL (ref 150–400)
RBC: 4.03 MIL/uL — ABNORMAL LOW (ref 4.22–5.81)
RDW: 13.5 % (ref 11.5–15.5)
WBC: 16.8 10*3/uL — AB (ref 4.0–10.5)

## 2017-08-31 MED ORDER — HYDROCORTISONE 0.5 % EX CREA
TOPICAL_CREAM | Freq: Two times a day (BID) | CUTANEOUS | Status: DC
  Administered 2017-08-31 – 2017-09-04 (×8): via TOPICAL
  Filled 2017-08-31: qty 28.35

## 2017-08-31 MED ORDER — LEVOTHYROXINE SODIUM 50 MCG PO TABS
150.0000 ug | ORAL_TABLET | Freq: Every day | ORAL | Status: DC
  Administered 2017-09-01 – 2017-09-04 (×4): 150 ug via ORAL
  Filled 2017-08-31 (×4): qty 1

## 2017-08-31 NOTE — Progress Notes (Signed)
PROGRESS NOTE    Kristopher Jennings  ZOX:[id number] DOB: 19-Feb-1941 DOA: 08/29/2017 PCP: Renford Dills, MD    Brief Narrative:  77 y.o. male with medical history significant of stroke, Parkinson's disease, recurrent aspiration pneumonia, inability to walk due to rigidity, BPH, hypothyroidism who presented to the emergency department after he was sent from his skilled nursing facility for the evaluation of fever and possible pneumonia.   Slow improvement on antibiotics.  Assessment & Plan:   Principal Problem:   Fever, unspecified - suspect 2ary to either pulmonary or urinary source or both. Improving on cefepime - once patient defervescence for 24-hour will consider narrowing antibiotic regimen. For now continue cefepime another day.   Aspiration pneumonia -Patient has history of recurrence in the past secondary to stroke.  Speech therapy currently evaluating.  Patient on aspiration precautions per my discussion with nursing. -Palliative consulted secondary to multiple many prior admissions  Active Problems:   Hypothyroidism - synthroid on board, stable    Parkinson's disease (HCC) - stable continue prior to admission medication regimen.    Urinary tract infection, site not specified - Continue current antibiotic regimen -Despite suspicious urinalysis urine culture negative.    BPH (benign prostatic hyperplasia) - stable currently    H/O: stroke -Most likely contributing to difficulty swallowing    Debility -Recommend physical therapy   DVT prophylaxis: Heparin Code Status: Full Family Communication: none at bedside. Disposition Plan: With continued improvement consider discharging in the 24 to 48 hours   Consultants:   Palliative care   Procedures: none   Antimicrobials: Cefepime   Subjective: The patient does not interact with examiner.  He gazes in my general direction  Objective: Vitals:   08/30/17 0646 08/30/17 1418 08/30/17 2126 08/31/17 0544    BP:  130/75 121/76 136/75  Pulse:  87 86 91  Resp:  18 18 19   Temp: 99.7 F (37.6 C) 99.7 F (37.6 C) (!) 97.5 F (36.4 C) 99.5 F (37.5 C)  TempSrc: Oral Oral Oral Oral  SpO2:  94% 94% 93%  Weight:      Height:        Intake/Output Summary (Last 24 hours) at 08/31/2017 1239 Last data filed at 08/31/2017 0547 Gross per 24 hour  Intake -  Output 1100 ml  Net -1100 ml   Filed Weights   08/29/17 0640  Weight: 52.2 kg (115 lb)    Examination:  General exam: Appears calm and comfortable  Respiratory system: Clear to auscultation. Respiratory effort normal. Cardiovascular system: S1 & S2 heard, RRR. No JVD, murmurs, rubs, gallops or clicks. No pedal edema. Gastrointestinal system: Abdomen is nondistended, soft and nontender. No organomegaly or masses felt. Normal bowel sounds heard. Central nervous system: No facial asymmetry limited cooperation secondary to suspected dementia or altered mental status secondary to UTI Extremities: Warm, no cyanosis Skin: No rashes, lesions or ulcers, on limited exam Psychiatry: Mood & affect appropriate.   Data Reviewed: I have personally reviewed following labs and imaging studies  CBC: Recent Labs  Lab 08/29/17 0457 08/30/17 0333 08/31/17 0001  WBC 17.5* 15.6* 16.8*  NEUTROABS 14.6*  --   --   HGB 14.5 12.8* 12.7*  HCT 44.2 39.5 39.0  MCV 96.7 97.3 96.8  PLT 199 176 188   Basic Metabolic Panel: Recent Labs  Lab 08/29/17 0457 08/30/17 0333  NA 150* 155*  K 3.8 3.5  CL 114* 123*  CO2 24 22  GLUCOSE 161* 100*  BUN 56* 34*  CREATININE 2.08* 1.27*  CALCIUM 8.9 9.1   GFR: Estimated Creatinine Clearance: 37.1 mL/min (A) (by C-G formula based on SCr of 1.27 mg/dL (H)). Liver Function Tests: Recent Labs  Lab 08/29/17 0457  AST 22  ALT 5*  ALKPHOS 83  BILITOT 0.8  PROT 8.1  ALBUMIN 3.3*   No results for input(s): LIPASE, AMYLASE in the last 168 hours. No results for input(s): AMMONIA in the last 168  hours. Coagulation Profile: No results for input(s): INR, PROTIME in the last 168 hours. Cardiac Enzymes: No results for input(s): CKTOTAL, CKMB, CKMBINDEX, TROPONINI in the last 168 hours. BNP (last 3 results) No results for input(s): PROBNP in the last 8760 hours. HbA1C: No results for input(s): HGBA1C in the last 72 hours. CBG: No results for input(s): GLUCAP in the last 168 hours. Lipid Profile: No results for input(s): CHOL, HDL, LDLCALC, TRIG, CHOLHDL, LDLDIRECT in the last 72 hours. Thyroid Function Tests: No results for input(s): TSH, T4TOTAL, FREET4, T3FREE, THYROIDAB in the last 72 hours. Anemia Panel: No results for input(s): VITAMINB12, FOLATE, FERRITIN, TIBC, IRON, RETICCTPCT in the last 72 hours. Sepsis Labs: Recent Labs  Lab 08/29/17 0511  LATICACIDVEN 1.59    Recent Results (from the past 240 hour(s))  Blood Culture (routine x 2)     Status: None (Preliminary result)   Collection Time: 08/29/17  4:57 AM  Result Value Ref Range Status   Specimen Description   Final    RIGHT ANTECUBITAL Performed at [facility], [address] [place], [place] [address]    Special Requests   Final    BOTTLES DRAWN AEROBIC AND ANAEROBIC Blood Culture adequate volume Performed at [facility], [address], [place], [place] [address]    Culture   Final    NO GROWTH 2 DAYS Performed at [facility], [address] [place], [place] [address]    Report Status PENDING  Incomplete  Blood Culture (routine x 2)     Status: None (Preliminary result)   Collection Time: 08/29/17  4:57 AM  Result Value Ref Range Status   Specimen Description   Final    BLOOD RIGHT ARM Performed at [facility], [address], [place], [place] [address]    Special Requests   Final    BOTTLES DRAWN AEROBIC AND ANAEROBIC Blood Culture adequate volume Performed at [facility], [address] [place], [place]  [address]    Culture   Final    NO GROWTH 2 DAYS Performed at [facility], [address] [place], [place] [address]    Report Status PENDING  Incomplete  Urine culture     Status: None   Collection Time: 08/29/17  6:59 AM  Result Value Ref Range Status   Specimen Description   Final    URINE, CLEAN CATCH Performed at [facility], [address] [place], [place] [address]    Special Requests   Final    Normal Performed at [facility] [address] [place], [place] [address]    Culture   Final    NO GROWTH Performed at [facility], [address] [place], [place] [address]    Report Status 08/30/2017 FINAL  Final  MRSA PCR Screening     Status: None   Collection Time: 08/29/17  1:51 PM  Result Value Ref Range Status   MRSA by PCR NEGATIVE NEGATIVE Final    Comment:  The GeneXpert MRSA Assay (FDA approved for NASAL specimens only), is one component of a comprehensive MRSA colonization surveillance program. It is not intended to diagnose MRSA infection nor to guide or monitor treatment for MRSA infections. Performed at [facility], [address] [place], [place] [address]      Radiology Studies: No results found.  Scheduled Meds: . buPROPion  200 mg Oral QAC breakfast  . carbidopa-levodopa  2 tablet Oral TID  . clonazePAM  0.5 mg Oral Daily  . guaiFENesin-dextromethorphan  5 mL Oral QID  . heparin  5,000 Units Subcutaneous Q8H  . hydrocortisone cream   Topical BID  . levothyroxine  75 mcg Intravenous Daily  . Melatonin  3 mg Oral QHS  . senna  1 tablet Oral QHS   Continuous Infusions: . sodium chloride 75 mL/hr at 08/31/17 0628  . ceFEPime (MAXIPIME) IV Stopped (08/31/17 0703)     LOS: 2 days    Time spent: 35 minutes  Penny Piarlando Jamarquis Crull, MD [facility] Pager [phone]  If 7PM-7AM, please contact night-coverage [email or web address] Password [email or web address] 08/31/2017, 12:39 PM

## 2017-08-31 NOTE — Progress Notes (Signed)
PHARMACIST - PHYSICIAN COMMUNICATION  DR: TRH  CONCERNING: IV to Oral Route Change Policy  RECOMMENDATION: This patient is receiving levothyroxine by the intravenous route.  Based on criteria approved by the Pharmacy and Therapeutics Committee, the intravenous medication(s) is/are being converted to the equivalent oral dose form(s).   DESCRIPTION: These criteria include:  The patient is eating (either orally or via tube) and/or has been taking other orally administered medications for a least 24 hours  The patient has no evidence of active gastrointestinal bleeding or impaired GI absorption (gastrectomy, short bowel, patient on TNA or NPO).  If you have questions about this conversion, please contact the Pharmacy Department  []   [phone]( [phone] )  [facility] []   [phone]( [phone] )  [facility] []   [phone]( [phone] )  [facility] []   [phone]( [phone] )  [facility] [x]   [phone]( [phone]  [facility]   Juliette Alcideustin Filiberto Wamble, PharmD, BCPS.   08/31/2017 2:42 PM

## 2017-08-31 NOTE — Progress Notes (Signed)
  Speech Language Pathology Treatment: Dysphagia  Patient Details Name: Kristopher Jennings MRN: [id number] DOB: 1940-03-23 Today's Date: 08/31/2017 Time: 5189-8421 SLP Time Calculation (min) (ACUTE ONLY): 20 min  Assessment / Plan / Recommendation Clinical Impression  Upon arrival of SLP, pt was seated upright in bed, being fed lunch by RN. Pt was noted to exhibit wet voice quality and congested, nonproductive cough after po intake. Pt and son are both aware of pts significantly high risk for aspiration, and have pt on dys 3 diet and thin liquids. Softer, chopped, moist solids will be better tolerated from an energy conservation standpoint (pt was having difficulty chewing green beans today, and was encouraged to expectorate them). Per prior MBS, thin liquids result in less post-swallow residue and were recommended over thickened liquids. Pt adamantly refuses pureed diet. Safe swallow precautions were posted at [facility], in attempt to mitigate aspiration risk.   Pt continues to be at very high risk for aspiration, and exhibits overt s/s aspiration during po intake (wet voice quality, congested cough). PO intake continues with known aspiration risk per pt/family wishes. No feeding tube is desired. ST will sign off at this time. Please reconsult if needs arise.   HPI HPI: 77 year old male admitted 08/29/17 from SNF with fever, PNA, UTI. PMH: CVA, Parkinson's disease, recurrent aspiration PNA, decreased mobility, BPH, hypothyroid, GERD, dysphagia      SLP Plan  Discharge SLP treatment due to goals met       Recommendations  Diet recommendations: (pt/son aware of high aspiration risk, and desire dys3/thin for pt) Medication Administration: Crushed with puree Supervision: Full supervision/cueing for compensatory strategies Compensations: Minimize environmental distractions;Slow rate;Small sips/bites Postural Changes and/or Swallow Maneuvers: Seated upright 90 degrees;Upright 30-60 min after meal                Oral Care Recommendations: Oral care before and after PO Follow up Recommendations: Skilled Nursing facility;24 hour supervision/assistance SLP Visit Diagnosis: Dysphagia, unspecified (R13.10) Plan: Discharge SLP treatment due to (comment)       GO               Celia B. Quentin [facility], CCC-SLP Speech Language Pathologist [phone]  Shonna Chock 08/31/2017, 2:04 PM

## 2017-08-31 NOTE — NC FL2 (Signed)
[place] MEDICAID FL2 LEVEL OF CARE SCREENING TOOL     IDENTIFICATION  Patient Name: Kristopher Jennings Birthdate: April 24, 1940 Sex: male Admission Date (Current Location): 08/29/2017  [facility] and [place] Number:  Producer, television/film/video and Address:  [facility],  [address] [place] [address]      Provider Number: [id number]  Attending Physician Name and Address:  Penny Pia, MD  Relative Name and Phone Number:       Current Level of Care: Hospital Recommended Level of Care: Skilled Nursing Facility Prior Approval Number:    Date Approved/Denied:   PASRR Number: [id number]  Discharge Plan: SNF    Current Diagnoses: Patient Active Problem List   Diagnosis Date Noted  . H/O: stroke 08/29/2017  . Debility 08/29/2017  . PNA (pneumonia) 08/29/2017  . Protein-calorie malnutrition, severe 07/23/2016  . HCAP (healthcare-associated pneumonia) 07/21/2016  . Encounter for palliative care   . Goals of care, counseling/discussion   . Pressure injury of skin 03/20/2016  . Lobar pneumonia (HCC) 03/19/2016  . Sepsis, unspecified organism (HCC) 03/19/2016  . Acute encephalopathy 03/19/2016  . Aspiration pneumonia (HCC) 03/19/2016  . BPH (benign prostatic hyperplasia) 01/01/2014  . Thyroid activity decreased 10/04/2013  . Fever, unspecified 10/03/2013  . Unspecified constipation 10/02/2013  . Other specified disease of white blood cells 10/02/2013  . Cough 05/03/2013  . Urinary tract infection, site not specified 02/26/2013  . Urinary hesitancy 02/19/2013  . Parkinson's disease (HCC) 01/12/2013  . Essential hypertension, benign 01/08/2013  . Hypertrophy of prostate without urinary obstruction and other lower urinary tract symptoms (LUTS) 01/08/2013  . Anxiety state, unspecified 01/08/2013  . Benign essential tremor 12/13/2012  . H/O urinary retention 12/13/2012  . Hypothyroidism 12/13/2012  . Insomnia 12/13/2012    Orientation RESPIRATION BLADDER Height  & Weight     Self, Place  Normal Incontinent, External catheter Weight: 115 lb (52.2 kg) Height:  6' (182.9 cm)  BEHAVIORAL SYMPTOMS/MOOD NEUROLOGICAL BOWEL NUTRITION STATUS      Incontinent Diet(dysphasia III diet, thin fluid consistency)  AMBULATORY STATUS COMMUNICATION OF NEEDS Skin   Extensive Assist Verbally Normal                       Personal Care Assistance Level of Assistance  Bathing, Feeding, Dressing Bathing Assistance: Maximum assistance Feeding assistance: Limited assistance Dressing Assistance: Maximum assistance     Functional Limitations Info  Sight, Hearing, Speech Sight Info: Adequate Hearing Info: Adequate Speech Info: Adequate    SPECIAL CARE FACTORS FREQUENCY  PT (By licensed PT), Speech therapy     PT Frequency: 3x       Speech Therapy Frequency: 1x      Contractures Contractures Info: Not present    Additional Factors Info  Code Status, Allergies Code Status Info: DNR Allergies Info: Demerol Meperidine, Lactose Intolerance (Gi), Nickel, Oxycodone           Current Medications (08/31/2017):  This is the current hospital active medication list Current Facility-Administered Medications  Medication Dose Route Frequency Provider Last Rate Last Dose  . 0.45 % sodium chloride infusion   Intravenous Continuous Burnadette Pop, MD 75 mL/hr at 08/31/17 5621    . buPROPion (WELLBUTRIN SR) 12 hr tablet 200 mg  200 mg Oral QAC breakfast Burnadette Pop, MD   200 mg at 08/31/17 0753  . carbidopa-levodopa (SINEMET IR) 25-100 MG per tablet immediate release 2 tablet  2 tablet Oral TID Burnadette Pop, MD   2 tablet at 08/31/17  [id number]  . ceFEPIme (MAXIPIME) 1 g in sodium chloride 0.9 % 100 mL IVPB  1 g Intravenous Q24H Aleda GranaZeigler, Dustin G, RPH   Stopped at 08/31/17 0703  . clonazePAM (KLONOPIN) tablet 0.5 mg  0.5 mg Oral Daily Burnadette PopAdhikari, Amrit, MD   0.5 mg at 08/31/17 0953  . guaiFENesin-dextromethorphan (ROBITUSSIN DM) 100-10 MG/5ML syrup 5 mL  5 mL Oral  QID Leda GauzeKirby-Graham, Karen J, NP   5 mL at 08/31/17 1122  . heparin injection 5,000 Units  5,000 Units Subcutaneous Q8H Burnadette PopAdhikari, Amrit, MD   5,000 Units at 08/31/17 0631  . hydrocortisone cream 0.5 %   Topical BID Penny PiaVega, [place], MD      . ipratropium-albuterol (DUONEB) 0.5-2.5 (3) MG/3ML nebulizer solution 3 mL  3 mL Nebulization Q4H PRN Adhikari, Amrit, MD      . levothyroxine (SYNTHROID, LEVOTHROID) injection 75 mcg  75 mcg Intravenous Daily Leda GauzeKirby-Graham, Karen J, NP   75 mcg at 08/31/17 0753  . Melatonin TABS 3 mg  3 mg Oral QHS Burnadette PopAdhikari, Amrit, MD   3 mg at 08/30/17 2233  . morphine 2 MG/ML injection 2 mg  2 mg Intravenous Q4H PRN Burnadette PopAdhikari, Amrit, MD   2 mg at 08/29/17 1631  . senna (SENOKOT) tablet 8.6 mg  1 tablet Oral QHS Burnadette PopAdhikari, Amrit, MD   8.6 mg at 08/30/17 2233     Discharge Medications: Please see discharge summary for a list of discharge medications.  Relevant Imaging Results:  Relevant Lab Results:   Additional Information ssn#[id number]. Needs palliative care to follow at facility  [facility] HeightsMeghan R Antoino Westhoff, LCSW

## 2017-09-01 DIAGNOSIS — J189 Pneumonia, unspecified organism: Secondary | ICD-10-CM

## 2017-09-01 DIAGNOSIS — Z8673 Personal history of transient ischemic attack (TIA), and cerebral infarction without residual deficits: Secondary | ICD-10-CM

## 2017-09-01 DIAGNOSIS — G20 Parkinson's disease: Secondary | ICD-10-CM

## 2017-09-01 DIAGNOSIS — E039 Hypothyroidism, unspecified: Secondary | ICD-10-CM

## 2017-09-01 DIAGNOSIS — J690 Pneumonitis due to inhalation of food and vomit: Secondary | ICD-10-CM

## 2017-09-01 DIAGNOSIS — R5381 Other malaise: Secondary | ICD-10-CM

## 2017-09-01 LAB — CBC
HEMATOCRIT: 38.1 % — AB (ref 39.0–52.0)
HEMOGLOBIN: 12.6 g/dL — AB (ref 13.0–17.0)
MCH: 32.1 pg (ref 26.0–34.0)
MCHC: 33.1 g/dL (ref 30.0–36.0)
MCV: 96.9 fL (ref 78.0–100.0)
Platelets: 192 10*3/uL (ref 150–400)
RBC: 3.93 MIL/uL — AB (ref 4.22–5.81)
RDW: 13.4 % (ref 11.5–15.5)
WBC: 17.2 10*3/uL — AB (ref 4.0–10.5)

## 2017-09-01 LAB — BASIC METABOLIC PANEL
Anion gap: 8 (ref 5–15)
BUN: 26 mg/dL — ABNORMAL HIGH (ref 6–20)
CO2: 24 mmol/L (ref 22–32)
Calcium: 8.6 mg/dL — ABNORMAL LOW (ref 8.9–10.3)
Chloride: 122 mmol/L — ABNORMAL HIGH (ref 101–111)
Creatinine, Ser: 0.91 mg/dL (ref 0.61–1.24)
GFR calc non Af Amer: >60 mL/min (ref >60)
Glucose, Bld: 133 mg/dL — ABNORMAL HIGH (ref 65–99)
POTASSIUM: 3.4 mmol/L — AB (ref 3.5–5.1)
SODIUM: 154 mmol/L — AB (ref 135–145)

## 2017-09-01 LAB — PROCALCITONIN: Procalcitonin: <0.1 ng/mL

## 2017-09-01 MED ORDER — ENOXAPARIN SODIUM 40 MG/0.4ML ~~LOC~~ SOLN
40.0000 mg | SUBCUTANEOUS | Status: DC
Start: 2017-09-01 — End: 2017-09-04
  Administered 2017-09-01 – 2017-09-03 (×3): 40 mg via SUBCUTANEOUS
  Filled 2017-09-01 (×3): qty 0.4

## 2017-09-01 MED ORDER — SODIUM CHLORIDE 0.9 % IV SOLN
1.0000 g | Freq: Two times a day (BID) | INTRAVENOUS | Status: DC
  Administered 2017-09-01 – 2017-09-02 (×2): 1 g via INTRAVENOUS
  Filled 2017-09-01 (×2): qty 1

## 2017-09-01 MED ORDER — POTASSIUM CHLORIDE CRYS ER 20 MEQ PO TBCR
40.0000 meq | EXTENDED_RELEASE_TABLET | Freq: Once | ORAL | Status: AC
  Administered 2017-09-01: 40 meq via ORAL
  Filled 2017-09-01: qty 2

## 2017-09-01 NOTE — Progress Notes (Signed)
PROGRESS NOTE  Kristopher Jennings VWU:[id number] DOB: [birth year] DOA: 08/29/2017 PCP: Renford Dills, MD  HPI/Recap of past 24 hours: Kristopher Jennings is a 77 year old male with past medical history significant for CVA, Parkinson's disease, recurrent aspiration pneumonia, BPH, hypothyroidism presented to the ED from SNF with a reported fever and possible recurrent aspiration pneumonia. Admitted for aspiration PNA.  09/01/17: seen and examined at his bedside. No acute distress. Unable to obtain ROS due to AMS. Alert but non verbal. CXR personally reviewed revealed RML infiltrates vs atelectasis w suspicion for aspiration. kOn dysphagia 3 diet per speech therapy.  Assessment/Plan: Principal Problem:   Fever, unspecified Active Problems:   Hypothyroidism   Parkinson's disease (HCC)   Urinary tract infection, site not specified   BPH (benign prostatic hyperplasia)   Aspiration pneumonia (HCC)   HCAP (healthcare-associated pneumonia)   H/O: stroke   Debility   PNA (pneumonia)  Aspiration PNA C/w IV cefepime Monitor fever curve Repeat CBC in the am C/w duonebs as needed procalcitonin <0.10 Worsening leukocytosis 17k from 16k Blood cx x2 no growth to date Monitor fever curve  Acute metabolic encephalopathy Most likely due to acute infection vs hypernatremia vs others Reorient as needed Fall precautions  Hypovolemic hypernatremia Na+ 154 from 155 C/w 1/2NS at 75cc/hr Repeat BMP q4H encourage increase po fluid intake  Hypokalemia K+ 3.4 repleted w 40 meq po kcl supplement Repeat BMP and magnesium in the am  Parkinson disease C/w home meds  AKI, resolved suspect prerenal 2/2 to dehydration Cr 0.91 from 1.27 C/w IV fluid hydration Encourage oral fluid intake Avoid nephrotoxic agents   Code Status: DNR  Family Communication: None at bedside   Disposition Plan: SNF when clinically stable   Consultants:  None  Procedures:  None  Antimicrobials:  IV  cefepime  DVT prophylaxis:  sq lovenox    Objective: Vitals:   08/31/17 2204 09/01/17 0532 09/01/17 0722 09/01/17 1314  BP: 119/69 112/68  132/72  Pulse: 77 91 85 87  Resp: 17 19    Temp: (!) 97.4 F (36.3 C) 97.8 F (36.6 C)  98.5 F (36.9 C)  TempSrc: Oral Oral  Oral  SpO2: 99% 96% 94% 96%  Weight:      Height:        Intake/Output Summary (Last 24 hours) at 09/01/2017 1347 Last data filed at 09/01/2017 0539 Gross per 24 hour  Intake 720 ml  Output 900 ml  Net -180 ml   Filed Weights   08/29/17 0640  Weight: 52.2 kg (115 lb)    Exam:  . General: 77 y.o. year-old male well developed well nourished in no acute distress.  Alert, non verbal and appears confused. . Cardiovascular: Regular rate and rhythm with no rubs or gallops.  No thyromegaly or JVD noted.   Marland Kitchen Respiratory: Clear to auscultation with no wheezes or rales. Good inspiratory effort. . Abdomen: Soft nontender nondistended with normal bowel sounds x4 quadrants. . Musculoskeletal: No lower extremity edema. 2/4 pulses in all 4 extremities. Marland Kitchen Psychiatry: Unable to assess due to AMS.   Data Reviewed: CBC: Recent Labs  Lab 08/29/17 0457 08/30/17 0333 08/31/17 0001 09/01/17 0955  WBC 17.5* 15.6* 16.8* 17.2*  NEUTROABS 14.6*  --   --   --   HGB 14.5 12.8* 12.7* 12.6*  HCT 44.2 39.5 39.0 38.1*  MCV 96.7 97.3 96.8 96.9  PLT 199 176 188 192   Basic Metabolic Panel: Recent Labs  Lab 08/29/17 0457 08/30/17 0333 09/01/17 0955  NA 150*  155* 154*  K 3.8 3.5 3.4*  CL 114* 123* 122*  CO2 24 22 24   GLUCOSE 161* 100* 133*  BUN 56* 34* 26*  CREATININE 2.08* 1.27* 0.91  CALCIUM 8.9 9.1 8.6*   GFR: Estimated Creatinine Clearance: 51.8 mL/min (by C-G formula based on SCr of 0.91 mg/dL). Liver Function Tests: Recent Labs  Lab 08/29/17 0457  AST 22  ALT 5*  ALKPHOS 83  BILITOT 0.8  PROT 8.1  ALBUMIN 3.3*   No results for input(s): LIPASE, AMYLASE in the last 168 hours. No results for input(s):  AMMONIA in the last 168 hours. Coagulation Profile: No results for input(s): INR, PROTIME in the last 168 hours. Cardiac Enzymes: No results for input(s): CKTOTAL, CKMB, CKMBINDEX, TROPONINI in the last 168 hours. BNP (last 3 results) No results for input(s): PROBNP in the last 8760 hours. HbA1C: No results for input(s): HGBA1C in the last 72 hours. CBG: No results for input(s): GLUCAP in the last 168 hours. Lipid Profile: No results for input(s): CHOL, HDL, LDLCALC, TRIG, CHOLHDL, LDLDIRECT in the last 72 hours. Thyroid Function Tests: No results for input(s): TSH, T4TOTAL, FREET4, T3FREE, THYROIDAB in the last 72 hours. Anemia Panel: No results for input(s): VITAMINB12, FOLATE, FERRITIN, TIBC, IRON, RETICCTPCT in the last 72 hours. Urine analysis:    Component Value Date/Time   COLORURINE AMBER (A) 08/29/2017 0659   APPEARANCEUR CLOUDY (A) 08/29/2017 0659   LABSPEC 1.018 08/29/2017 0659   PHURINE 7.0 08/29/2017 0659   GLUCOSEU NEGATIVE 08/29/2017 0659   HGBUR SMALL (A) 08/29/2017 0659   BILIRUBINUR NEGATIVE 08/29/2017 0659   KETONESUR 5 (A) 08/29/2017 0659   PROTEINUR 100 (A) 08/29/2017 0659   NITRITE NEGATIVE 08/29/2017 0659   LEUKOCYTESUR LARGE (A) 08/29/2017 0659   Sepsis Labs: @LABRCNTIP (procalcitonin:4,lacticidven:4)  ) Recent Results (from the past 240 hour(s))  Blood Culture (routine x 2)     Status: None (Preliminary result)   Collection Time: 08/29/17  4:57 AM  Result Value Ref Range Status   Specimen Description   Final    RIGHT ANTECUBITAL Performed at [facility], [address] [place], [place] [address]    Special Requests   Final    BOTTLES DRAWN AEROBIC AND ANAEROBIC Blood Culture adequate volume Performed at [facility], [address], [place], [place] [address]    Culture   Final    NO GROWTH 2 DAYS Performed at [facility], [address], [place], [place] [address]    Report Status PENDING   Incomplete  Blood Culture (routine x 2)     Status: None (Preliminary result)   Collection Time: 08/29/17  4:57 AM  Result Value Ref Range Status   Specimen Description   Final    BLOOD RIGHT ARM Performed at [facility], [address], [place], [place] [address]    Special Requests   Final    BOTTLES DRAWN AEROBIC AND ANAEROBIC Blood Culture adequate volume Performed at [facility], [address] [place], [place] [address]    Culture   Final    NO GROWTH 2 DAYS Performed at [facility], [address], [place], [place] [address]    Report Status PENDING  Incomplete  Urine culture     Status: None   Collection Time: 08/29/17  6:59 AM  Result Value Ref Range Status   Specimen Description   Final    URINE, CLEAN CATCH Performed at [facility],  [address] [place], [place] [address]    Special Requests   Final    Normal Performed at [facility], [address] [place], [place] [address]    Culture   Final    NO GROWTH Performed at [facility], [address] [place], [place] [address]    Report Status 08/30/2017 FINAL  Final  MRSA PCR Screening     Status: None   Collection Time: 08/29/17  1:51 PM  Result Value Ref Range Status   MRSA by PCR NEGATIVE NEGATIVE Final    Comment:        The GeneXpert MRSA Assay (FDA approved for NASAL specimens only), is one component of a comprehensive MRSA colonization surveillance program. It is not intended to diagnose MRSA infection nor to guide or monitor treatment for MRSA infections. Performed at [facility], [address] [place], [place] [address]       Studies: No results found.  Scheduled Meds: . buPROPion  200 mg Oral QAC breakfast  . carbidopa-levodopa  2 tablet Oral TID  . clonazePAM  0.5 mg Oral Daily  . guaiFENesin-dextromethorphan  5 mL Oral QID  . heparin  5,000 Units Subcutaneous Q8H  .  hydrocortisone cream   Topical BID  . levothyroxine  150 mcg Oral QAC breakfast  . Melatonin  3 mg Oral QHS  . senna  1 tablet Oral QHS    Continuous Infusions: . sodium chloride 75 mL/hr at 08/31/17 0628  . ceFEPime (MAXIPIME) IV 1 g (09/01/17 0622)     LOS: 3 days     Darlin Droparole N Hall, MD [facility] Pager [phone]  If 7PM-7AM, please contact night-coverage [email or web address] Password [email or web address] 09/01/2017, 1:47 PM

## 2017-09-01 NOTE — Progress Notes (Signed)
PMT progress note  Patient is sitting up in bed Still has cough, sounds congested BP 132/72 (BP Location: Right Arm)   Pulse 87   Temp 98.5 F (36.9 C) (Oral)   Resp 19   Ht 6' (1.829 m)   Wt 52.2 kg (115 lb)   SpO2 96%   BMI 15.60 kg/m  Labs and imaging noted Still has leukocytosis. On IV Abx, pharmacy following.   SLP notes reviewed.   Resting in bed Is able to answer few questions appropriately In no distress Coarse scattered breath sounds S1 S2 Abdomen soft No edema  PPS 40%  Stroke Parkinson's Recurrent aspiration PNA Bed bound due to rigidity Possible UTI  Continue current mode of care Recommend SNF [facility] with palliative care on discharge.  Discussed with son over the phone.   25 minutes spent Kristopher HawkingZeba Allisha Harter MD [facility] palliative medicine [phone]  [phone]

## 2017-09-01 NOTE — Progress Notes (Addendum)
Pharmacy Antibiotic Note  Kristopher Jennings is a 77 y.o. male admitted on 08/29/2017 with pneumonia.  Pharmacy has been consulted for vancomycin and cefepime dosing.  Patient reported to have a positive cough with yellow-green sputum.  He reports feeling SOB.  Recently admitted with pneumonia - suspected due to aspiration. Started on vancomycin and cefepime, now narrowed to cefepime alone.  Today,09/01/2017  Renal: SCr back to baseline  WBC remain elevated w/o steroids  Remains afebrile  Plan:  Increase cefepime to 1g IV q12 hr with improved renal function  Consider narrowing to Unasyn or Ceftriaxone as pseudomonas risk likely low. Alternatively could add Flagyl to cover anaerobes associated with aspiration pneumonia.   Height: 6' (182.9 cm) Weight: 115 lb (52.2 kg) IBW/kg (Calculated) : 77.6  Temp (24hrs), Avg:98 F (36.7 C), Min:97.4 F (36.3 C), Max:98.5 F (36.9 C)  Recent Labs  Lab 08/29/17 0457 08/29/17 0511 08/30/17 0333 08/31/17 0001 09/01/17 0955  WBC 17.5*  --  15.6* 16.8* 17.2*  CREATININE 2.08*  --  1.27*  --  0.91  LATICACIDVEN  --  1.59  --   --   --     Estimated Creatinine Clearance: 51.8 mL/min (by C-G formula based on SCr of 0.91 mg/dL).    Allergies  Allergen Reactions  . Demerol [Meperidine] Other (See Comments)    Reaction:  Unknown   . Lactose Intolerance (Gi) Other (See Comments)    Reaction:  Unknown   . Nickel Other (See Comments)    Reaction:  Unknown   . Oxycodone Other (See Comments)    Reaction:  Unknown     Antimicrobials this admission: 6/10 vanco x1 6/10 cefepime >>   Dose adjustments this admission: 6/13: increase to 1g q12 with improved Scr  Microbiology results: 6/10 BCx: ngtd 6/10 UCx:  ngf  Thank you for allowing pharmacy to be a part of this patient's care.  Bernadene Personrew Glendine Swetz, PharmD, BCPS [phone] 09/01/2017, 2:24 PM

## 2017-09-01 NOTE — Care Management Important Message (Signed)
Important Message  Patient Details  Name: Kristopher Jennings MRN: [id number] Date of Birth: Dec 07, 1940   Medicare Important Message Given:  Yes    Caren MacadamFuller, Kaylean Tupou 09/01/2017, 11:54 AMImportant Message  Patient Details  Name: Kristopher Jennings MRN: [id number] Date of Birth: Dec 07, 1940   Medicare Important Message Given:  Yes    Caren MacadamFuller, Ainsley Deakins 09/01/2017, 11:53 AM

## 2017-09-02 LAB — BASIC METABOLIC PANEL
Anion gap: 6 (ref 5–15)
BUN: 18 mg/dL (ref 6–20)
CO2: 26 mmol/L (ref 22–32)
CREATININE: 0.8 mg/dL (ref 0.61–1.24)
Calcium: 8.5 mg/dL — ABNORMAL LOW (ref 8.9–10.3)
Chloride: 120 mmol/L — ABNORMAL HIGH (ref 101–111)
Glucose, Bld: 124 mg/dL — ABNORMAL HIGH (ref 65–99)
POTASSIUM: 3.7 mmol/L (ref 3.5–5.1)
SODIUM: 152 mmol/L — AB (ref 135–145)

## 2017-09-02 LAB — LACTIC ACID, PLASMA
LACTIC ACID, VENOUS: 1.5 mmol/L (ref 0.5–1.9)
LACTIC ACID, VENOUS: 0.9 mmol/L (ref 0.5–1.9)

## 2017-09-02 LAB — CBC
HCT: 35.5 % — ABNORMAL LOW (ref 39.0–52.0)
HEMOGLOBIN: 11.7 g/dL — AB (ref 13.0–17.0)
MCH: 31.8 pg (ref 26.0–34.0)
MCHC: 33 g/dL (ref 30.0–36.0)
MCV: 96.5 fL (ref 78.0–100.0)
PLATELETS: 178 10*3/uL (ref 150–400)
RBC: 3.68 MIL/uL — AB (ref 4.22–5.81)
RDW: 13.6 % (ref 11.5–15.5)
WBC: 16.9 10*3/uL — ABNORMAL HIGH (ref 4.0–10.5)

## 2017-09-02 LAB — PROCALCITONIN: Procalcitonin: <0.1 ng/mL

## 2017-09-02 MED ORDER — IPRATROPIUM-ALBUTEROL 0.5-2.5 (3) MG/3ML IN SOLN
3.0000 mL | RESPIRATORY_TRACT | Status: DC | PRN
  Administered 2017-09-03: 3 mL via RESPIRATORY_TRACT
  Filled 2017-09-02: qty 3

## 2017-09-02 MED ORDER — SODIUM CHLORIDE 0.9 % IV SOLN
3.0000 g | Freq: Four times a day (QID) | INTRAVENOUS | Status: DC
  Administered 2017-09-02 – 2017-09-04 (×7): 3 g via INTRAVENOUS
  Filled 2017-09-02 (×8): qty 3

## 2017-09-02 MED ORDER — ALBUTEROL SULFATE (2.5 MG/3ML) 0.083% IN NEBU: 2.5000 mg | INHALATION_SOLUTION | Freq: Three times a day (TID) | RESPIRATORY_TRACT | Status: DC

## 2017-09-02 MED ORDER — SODIUM CHLORIDE 3 % IN NEBU
4.0000 mL | INHALATION_SOLUTION | Freq: Three times a day (TID) | RESPIRATORY_TRACT | Status: DC
  Administered 2017-09-02: 4 mL via RESPIRATORY_TRACT
  Filled 2017-09-02 (×2): qty 4

## 2017-09-02 MED ORDER — GUAIFENESIN ER 600 MG PO TB12
1200.0000 mg | ORAL_TABLET | Freq: Two times a day (BID) | ORAL | Status: DC
  Administered 2017-09-02 – 2017-09-04 (×4): 1200 mg via ORAL
  Filled 2017-09-02 (×5): qty 2

## 2017-09-02 MED ORDER — ALBUTEROL SULFATE (2.5 MG/3ML) 0.083% IN NEBU
2.5000 mg | INHALATION_SOLUTION | Freq: Four times a day (QID) | RESPIRATORY_TRACT | Status: DC
  Administered 2017-09-02 (×2): 2.5 mg via RESPIRATORY_TRACT
  Filled 2017-09-02 (×2): qty 3

## 2017-09-02 NOTE — Progress Notes (Signed)
PROGRESS NOTE  Kristopher Jennings ZOX:[id number] DOB: [birth year] DOA: 08/29/2017 PCP: Renford Dills, MD  HPI/Recap of past 24 hours: Mr. Kristopher Jennings is a 77 year old male with past medical history significant for CVA, Parkinson's disease, recurrent aspiration pneumonia, BPH, hypothyroidism presented to the ED from SNF with a reported fever and possible recurrent aspiration pneumonia. Admitted for aspiration PNA.  09/01/17: seen and examined at his bedside. No acute distress. Unable to obtain ROS due to AMS. Alert but non verbal. CXR personally reviewed revealed RML infiltrates vs atelectasis w suspicion for aspiration. kOn dysphagia 3 diet per speech therapy.  09/02/2017: Patient seen and examined at his bedside.  He is more alert today.  Has no new complaints.  No acute events overnight.  Productive cough noted on exam.  Started pulmonary toilet.  Assessment/Plan: Principal Problem:   Fever, unspecified Active Problems:   Hypothyroidism   Parkinson's disease (HCC)   Urinary tract infection, site not specified   BPH (benign prostatic hyperplasia)   Aspiration pneumonia (HCC)   HCAP (healthcare-associated pneumonia)   H/O: stroke   Debility   PNA (pneumonia)  Aspiration pneumonia Completed IV cefepime Start IV Unasyn on 09/02/2017 Started DuoNeb every 6 hours and every 2 hours as needed Persistent leukocytosis 16 K from 17 K yesterday Continue to monitor fever curve Repeat CBC in the morning  Acute metabolic encephalopathy, improving  Suspect multifactorial most likely due to acute infection vs hypernatremia vs others Reorient as needed Fall precautions  Hypovolemic hyponatremia, improving  Continue gentle IV hydration half-normal saline at 75 cc/h Repeat BMP in the morning encourage increase po fluid intake Sodium 152 from 154 yesterday  Moderate protein calorie malnutrition Albumin 3.3 BMI 15 High risk of aspiration Speech therapy following  Hypokalemia,  resolved Potassium 3.7 from K+ 3.4 yesterday repleted w 40 meq po kcl supplement  Parkinson disease C/w home meds  AKI, resolved suspect prerenal 2/2 to dehydration C/w IV fluid hydration Encourage oral fluid intake Avoid nephrotoxic agents   Code Status: DNR  Family Communication: None at bedside   Disposition Plan: SNF when clinically stable   Consultants:  None  Procedures:  None  Antimicrobials:  IV Unasyn  DVT prophylaxis:  sq lovenox    Objective: Vitals:   09/01/17 0722 09/01/17 1314 09/01/17 2043 09/02/17 0504  BP:  132/72 125/79 126/69  Pulse: 85 87 86 74  Resp:   (!) 40 (!) 36  Temp:  98.5 F (36.9 C) 97.7 F (36.5 C) 97.7 F (36.5 C)  TempSrc:  Oral Axillary Oral  SpO2: 94% 96% 94% 90%  Weight:      Height:        Intake/Output Summary (Last 24 hours) at 09/02/2017 1342 Last data filed at 09/02/2017 0800 Gross per 24 hour  Intake 735.83 ml  Output 875 ml  Net -139.17 ml   Filed Weights   08/29/17 0640  Weight: 52.2 kg (115 lb)    Exam:  . General: 77 y.o. year-old male emaciated in no acute distress.  Alert but nonverbal.   . Cardiovascular: Regular rate and rhythm with no rubs or gallops.  No thyromegaly or JVD noted. Marland Kitchen Respiratory: Bilateral diffused rales with no wheezes.  Poor inspiratory effort. . Abdomen: Soft nontender nondistended with normal bowel sounds x4 quadrants. . Musculoskeletal: No lower extremity edema. 2/4 pulses in all 4 extremities. Marland Kitchen Psychiatry: Unable to assess due to AMS.   Data Reviewed: CBC: Recent Labs  Lab 08/29/17 0457 08/30/17 8119 08/31/17 0001 09/01/17 0955 09/02/17 0415  WBC 17.5* 15.6* 16.8* 17.2* 16.9*  NEUTROABS 14.6*  --   --   --   --   HGB 14.5 12.8* 12.7* 12.6* 11.7*  HCT 44.2 39.5 39.0 38.1* 35.5*  MCV 96.7 97.3 96.8 96.9 96.5  PLT 199 176 188 192 178   Basic Metabolic Panel: Recent Labs  Lab 08/29/17 0457 08/30/17 0333 09/01/17 0955 09/02/17 0415  NA 150* 155* 154*  152*  K 3.8 3.5 3.4* 3.7  CL 114* 123* 122* 120*  CO2 24 22 24 26   GLUCOSE 161* 100* 133* 124*  BUN 56* 34* 26* 18  CREATININE 2.08* 1.27* 0.91 0.80  CALCIUM 8.9 9.1 8.6* 8.5*   GFR: Estimated Creatinine Clearance: 58.9 mL/min (by C-G formula based on SCr of 0.8 mg/dL). Liver Function Tests: Recent Labs  Lab 08/29/17 0457  AST 22  ALT 5*  ALKPHOS 83  BILITOT 0.8  PROT 8.1  ALBUMIN 3.3*   No results for input(s): LIPASE, AMYLASE in the last 168 hours. No results for input(s): AMMONIA in the last 168 hours. Coagulation Profile: No results for input(s): INR, PROTIME in the last 168 hours. Cardiac Enzymes: No results for input(s): CKTOTAL, CKMB, CKMBINDEX, TROPONINI in the last 168 hours. BNP (last 3 results) No results for input(s): PROBNP in the last 8760 hours. HbA1C: No results for input(s): HGBA1C in the last 72 hours. CBG: No results for input(s): GLUCAP in the last 168 hours. Lipid Profile: No results for input(s): CHOL, HDL, LDLCALC, TRIG, CHOLHDL, LDLDIRECT in the last 72 hours. Thyroid Function Tests: No results for input(s): TSH, T4TOTAL, FREET4, T3FREE, THYROIDAB in the last 72 hours. Anemia Panel: No results for input(s): VITAMINB12, FOLATE, FERRITIN, TIBC, IRON, RETICCTPCT in the last 72 hours. Urine analysis:    Component Value Date/Time   COLORURINE AMBER (A) 08/29/2017 0659   APPEARANCEUR CLOUDY (A) 08/29/2017 0659   LABSPEC 1.018 08/29/2017 0659   PHURINE 7.0 08/29/2017 0659   GLUCOSEU NEGATIVE 08/29/2017 0659   HGBUR SMALL (A) 08/29/2017 0659   BILIRUBINUR NEGATIVE 08/29/2017 0659   KETONESUR 5 (A) 08/29/2017 0659   PROTEINUR 100 (A) 08/29/2017 0659   NITRITE NEGATIVE 08/29/2017 0659   LEUKOCYTESUR LARGE (A) 08/29/2017 0659   Sepsis Labs: @LABRCNTIP (procalcitonin:4,lacticidven:4)  ) Recent Results (from the past 240 hour(s))  Blood Culture (routine x 2)     Status: None (Preliminary result)   Collection Time: 08/29/17  4:57 AM  Result Value  Ref Range Status   Specimen Description   Final    RIGHT ANTECUBITAL Performed at [facility], [address] [place], [place] [address]    Special Requests   Final    BOTTLES DRAWN AEROBIC AND ANAEROBIC Blood Culture adequate volume Performed at [facility], [address] [place], [place] [address]    Culture   Final    NO GROWTH 3 DAYS Performed at [facility], [address] [place], [place] [address]    Report Status PENDING  Incomplete  Blood Culture (routine x 2)     Status: None (Preliminary result)   Collection Time: 08/29/17  4:57 AM  Result Value Ref Range Status   Specimen Description   Final    BLOOD RIGHT ARM Performed at [facility], [address] [place], [place] [address]    Special Requests   Final    BOTTLES DRAWN AEROBIC AND ANAEROBIC Blood Culture adequate volume Performed at [facility], [address] [place], [place] [address]  Culture   Final    NO GROWTH 3 DAYS Performed at [facility], [address] [place], [place] [address]    Report Status PENDING  Incomplete  Urine culture     Status: None   Collection Time: 08/29/17  6:59 AM  Result Value Ref Range Status   Specimen Description   Final    URINE, CLEAN CATCH Performed at [facility], [address], [place], [place] [address]    Special Requests   Final    Normal Performed at [facility], [address], [place], [place] [address]    Culture   Final    NO GROWTH Performed at [facility], [address], [place], [place] [address]    Report Status 08/30/2017 FINAL  Final  MRSA PCR Screening     Status: None   Collection Time: 08/29/17  1:51 PM  Result Value Ref Range Status   MRSA by PCR NEGATIVE NEGATIVE Final    Comment:        The GeneXpert MRSA Assay (FDA approved for NASAL specimens only), is one component of a comprehensive MRSA  colonization surveillance program. It is not intended to diagnose MRSA infection nor to guide or monitor treatment for MRSA infections. Performed at [facility], [address], [place], [place] [address]       Studies: No results found.  Scheduled Meds: . buPROPion  200 mg Oral QAC breakfast  . carbidopa-levodopa  2 tablet Oral TID  . clonazePAM  0.5 mg Oral Daily  . enoxaparin (LOVENOX) injection  40 mg Subcutaneous Q24H  . guaiFENesin-dextromethorphan  5 mL Oral QID  . hydrocortisone cream   Topical BID  . levothyroxine  150 mcg Oral QAC breakfast  . Melatonin  3 mg Oral QHS  . senna  1 tablet Oral QHS    Continuous Infusions: . sodium chloride 75 mL/hr at 09/02/17 0518  . ampicillin-sulbactam (UNASYN) IV       LOS: 4 days     Darlin Drop, MD [facility] Pager [phone]  If 7PM-7AM, please contact night-coverage [email or web address] Password [email or web address] 09/02/2017, 1:42 PM

## 2017-09-02 NOTE — Progress Notes (Signed)
PMT progress note  Patient is sitting up in bed States he is feeling better, he is eating breakfast with the assistance of his nurse tech in the room.   BP 126/69 (BP Location: Right Arm)   Pulse 74   Temp 97.7 F (36.5 C) (Oral)   Resp (!) 36   Ht 6' (1.829 m)   Wt 52.2 kg (115 lb)   SpO2 90%   BMI 15.60 kg/m  Labs and imaging noted Still has leukocytosis. Is on antibiotics.   SLP notes reviewed.   Resting in bed Is able to answer few questions appropriately In no distress Coarse scattered breath sounds S1 S2 Abdomen soft No edema  PPS 40%  Stroke Parkinson's Recurrent aspiration PNA Bed bound due to rigidity Possible UTI  Continue current mode of care Recommend [facility] with palliative care on discharge.  Discussed with son over the phone on 09-01-17.   25 minutes spent Rosalin HawkingZeba Samanthia Howland MD [facility] palliative medicine [phone]  [phone]

## 2017-09-03 LAB — CBC
HEMATOCRIT: 34.6 % — AB (ref 39.0–52.0)
Hemoglobin: 11.1 g/dL — ABNORMAL LOW (ref 13.0–17.0)
MCH: 31.2 pg (ref 26.0–34.0)
MCHC: 32.1 g/dL (ref 30.0–36.0)
MCV: 97.2 fL (ref 78.0–100.0)
PLATELETS: 157 10*3/uL (ref 150–400)
RBC: 3.56 MIL/uL — ABNORMAL LOW (ref 4.22–5.81)
RDW: 13.4 % (ref 11.5–15.5)
WBC: 15.8 10*3/uL — AB (ref 4.0–10.5)

## 2017-09-03 LAB — BASIC METABOLIC PANEL
ANION GAP: 6 (ref 5–15)
BUN: 18 mg/dL (ref 6–20)
CALCIUM: 8.3 mg/dL — AB (ref 8.9–10.3)
CO2: 28 mmol/L (ref 22–32)
CREATININE: 0.84 mg/dL (ref 0.61–1.24)
Chloride: 113 mmol/L — ABNORMAL HIGH (ref 101–111)
Glucose, Bld: 117 mg/dL — ABNORMAL HIGH (ref 65–99)
Potassium: 3.5 mmol/L (ref 3.5–5.1)
SODIUM: 147 mmol/L — AB (ref 135–145)

## 2017-09-03 LAB — CULTURE, BLOOD (ROUTINE X 2)
CULTURE: NO GROWTH
Culture: NO GROWTH
SPECIAL REQUESTS: ADEQUATE
SPECIAL REQUESTS: ADEQUATE

## 2017-09-03 MED ORDER — SODIUM CHLORIDE 3 % IN NEBU
4.0000 mL | INHALATION_SOLUTION | Freq: Two times a day (BID) | RESPIRATORY_TRACT | Status: DC
  Administered 2017-09-03 – 2017-09-04 (×3): 4 mL via RESPIRATORY_TRACT
  Filled 2017-09-03 (×4): qty 4

## 2017-09-03 MED ORDER — ALBUTEROL SULFATE (2.5 MG/3ML) 0.083% IN NEBU
2.5000 mg | INHALATION_SOLUTION | Freq: Three times a day (TID) | RESPIRATORY_TRACT | Status: DC
  Administered 2017-09-03 – 2017-09-04 (×4): 2.5 mg via RESPIRATORY_TRACT
  Filled 2017-09-03 (×4): qty 3

## 2017-09-03 NOTE — Progress Notes (Signed)
PROGRESS NOTE  Kristopher Jennings WUJ:[id number] DOB: 1940/06/01 DOA: 08/29/2017 PCP: Renford Dills, MD  HPI/Recap of past 24 hours: Kristopher Jennings is a 77 year old male with past medical history significant for CVA, Parkinson's disease, recurrent aspiration pneumonia, BPH, hypothyroidism presented to the ED from SNF with a reported fever and possible recurrent aspiration pneumonia. Admitted for aspiration PNA.  09/01/17: seen and examined at his bedside. No acute distress. Unable to obtain ROS due to AMS. Alert but non verbal. CXR personally reviewed revealed RML infiltrates vs atelectasis w suspicion for aspiration. kOn dysphagia 3 diet per speech therapy.  09/02/2017: Patient seen and examined at his bedside.  He is more alert today.  Has no new complaints.  No acute events overnight.  Productive cough noted on exam.  Started pulmonary toilet.  09/03/17: No acute events overnight.  No new complaints.  Appears more alert and in no acute distress but nonverbal.  Assessment/Plan: Principal Problem:   Fever, unspecified Active Problems:   Hypothyroidism   Parkinson's disease (HCC)   Urinary tract infection, site not specified   BPH (benign prostatic hyperplasia)   Aspiration pneumonia (HCC)   HCAP (healthcare-associated pneumonia)   H/O: stroke   Debility   PNA (pneumonia)  Aspiration pneumonia Aspiration pneumonia is improving Completed IV cefepime Continue IV Unasyn Started IV Unasyn on 09/02/2017 Started DuoNeb every 6 hours and every 2 hours as needed Leukocytosis improving 15 K from 16 K yesterday  Afebrile in the last 24 hours Last fever on 09/02/2017 with T-max of 101.7 Blood cultures x2 in process Continue to monitor fever curve Repeat CBC in the morning  Acute metabolic encephalopathy Altered mental status appears to be improving Suspect multifactorial most likely due to acute infection vs hypernatremia vs others Reorient as needed Fall precautions  Hypovolemic  hypernatremia is improving Sodium 147 from 153 yesterday Continue gentle IV hydration half-normal saline at 75 cc/h Repeat BMP in the morning encourage increase po fluid intake  Moderate protein calorie malnutrition Albumin 3.3 BMI 15 High risk of aspiration Speech therapy following  Hypokalemia, resolved Potassium 3.7 from K+ 3.4 yesterday repleted w 40 meq po kcl supplement  Parkinson disease C/w home meds  AKI, resolved suspect prerenal 2/2 to dehydration C/w IV fluid hydration Encourage oral fluid intake Avoid nephrotoxic agents   Code Status: DNR  Family Communication: None at bedside   Disposition Plan: SNF when clinically stable.  Possibly tomorrow 09/04/2017 if no events overnight.   Consultants:  None  Procedures:  None  Antimicrobials:  IV Unasyn  DVT prophylaxis:  sq lovenox    Objective: Vitals:   09/02/17 2139 09/03/17 0400 09/03/17 0927 09/03/17 1343  BP:  117/72  101/61  Pulse:  79  78  Resp:  12  (!) 24  Temp:  98.1 F (36.7 C)  99.4 F (37.4 C)  TempSrc:  Oral  Oral  SpO2: 95% 98% 98% 96%  Weight:      Height:        Intake/Output Summary (Last 24 hours) at 09/03/2017 1615 Last data filed at 09/03/2017 0745 Gross per 24 hour  Intake 1930 ml  Output 1100 ml  Net 830 ml   Filed Weights   08/29/17 0640  Weight: 52.2 kg (115 lb)    Exam:  . General: 77 y.o. year-old male  emaciated, nonverbal in no acute distress.   . Cardiovascular: Regular rate and rhythm with no rubs or gallops.  No JVD or thyromegaly noted. Marland Kitchen Respiratory: Mild bilateral diffuse rales improved  from prior with no wheezes.  Poor inspiratory effort.  . Abdomen: Soft nontender nondistended with normal bowel sounds x4 quadrants. . Musculoskeletal: No lower extremity edema. 2/4 pulses in all 4 extremities. Marland Kitchen Psychiatry: Unable to assess due to AMS.   Data Reviewed: CBC: Recent Labs  Lab 08/29/17 0457 08/30/17 0333 08/31/17 0001 09/01/17 0955  09/02/17 0415 09/03/17 0442  WBC 17.5* 15.6* 16.8* 17.2* 16.9* 15.8*  NEUTROABS 14.6*  --   --   --   --   --   HGB 14.5 12.8* 12.7* 12.6* 11.7* 11.1*  HCT 44.2 39.5 39.0 38.1* 35.5* 34.6*  MCV 96.7 97.3 96.8 96.9 96.5 97.2  PLT 199 176 188 192 178 157   Basic Metabolic Panel: Recent Labs  Lab 08/29/17 0457 08/30/17 0333 09/01/17 0955 09/02/17 0415 09/03/17 0442  NA 150* 155* 154* 152* 147*  K 3.8 3.5 3.4* 3.7 3.5  CL 114* 123* 122* 120* 113*  CO2 24 22 24 26 28   GLUCOSE 161* 100* 133* 124* 117*  BUN 56* 34* 26* 18 18  CREATININE 2.08* 1.27* 0.91 0.80 0.84  CALCIUM 8.9 9.1 8.6* 8.5* 8.3*   GFR: Estimated Creatinine Clearance: 56.1 mL/min (by C-G formula based on SCr of 0.84 mg/dL). Liver Function Tests: Recent Labs  Lab 08/29/17 0457  AST 22  ALT 5*  ALKPHOS 83  BILITOT 0.8  PROT 8.1  ALBUMIN 3.3*   No results for input(s): LIPASE, AMYLASE in the last 168 hours. No results for input(s): AMMONIA in the last 168 hours. Coagulation Profile: No results for input(s): INR, PROTIME in the last 168 hours. Cardiac Enzymes: No results for input(s): CKTOTAL, CKMB, CKMBINDEX, TROPONINI in the last 168 hours. BNP (last 3 results) No results for input(s): PROBNP in the last 8760 hours. HbA1C: No results for input(s): HGBA1C in the last 72 hours. CBG: No results for input(s): GLUCAP in the last 168 hours. Lipid Profile: No results for input(s): CHOL, HDL, LDLCALC, TRIG, CHOLHDL, LDLDIRECT in the last 72 hours. Thyroid Function Tests: No results for input(s): TSH, T4TOTAL, FREET4, T3FREE, THYROIDAB in the last 72 hours. Anemia Panel: No results for input(s): VITAMINB12, FOLATE, FERRITIN, TIBC, IRON, RETICCTPCT in the last 72 hours. Urine analysis:    Component Value Date/Time   COLORURINE AMBER (A) 08/29/2017 0659   APPEARANCEUR CLOUDY (A) 08/29/2017 0659   LABSPEC 1.018 08/29/2017 0659   PHURINE 7.0 08/29/2017 0659   GLUCOSEU NEGATIVE 08/29/2017 0659   HGBUR SMALL  (A) 08/29/2017 0659   BILIRUBINUR NEGATIVE 08/29/2017 0659   KETONESUR 5 (A) 08/29/2017 0659   PROTEINUR 100 (A) 08/29/2017 0659   NITRITE NEGATIVE 08/29/2017 0659   LEUKOCYTESUR LARGE (A) 08/29/2017 0659   Sepsis Labs: @LABRCNTIP (procalcitonin:4,lacticidven:4)  ) Recent Results (from the past 240 hour(s))  Blood Culture (routine x 2)     Status: None   Collection Time: 08/29/17  4:57 AM  Result Value Ref Range Status   Specimen Description   Final    RIGHT ANTECUBITAL Performed at [facility], [address], [place], [place] [address]    Special Requests   Final    BOTTLES DRAWN AEROBIC AND ANAEROBIC Blood Culture adequate volume Performed at [facility], [address], [place], [place] [address]    Culture   Final    NO GROWTH 5 DAYS Performed at [facility], [address] [place], [place] [address]    Report Status 09/03/2017 FINAL  Final  Blood Culture (routine x 2)  Status: None   Collection Time: 08/29/17  4:57 AM  Result Value Ref Range Status   Specimen Description   Final    BLOOD RIGHT ARM Performed at [facility], [address] [place], [place] [address]    Special Requests   Final    BOTTLES DRAWN AEROBIC AND ANAEROBIC Blood Culture adequate volume Performed at [facility], [address] [place], [place] [address]    Culture   Final    NO GROWTH 5 DAYS Performed at [facility], [address] [place], [place] [address]    Report Status 09/03/2017 FINAL  Final  Urine culture     Status: None   Collection Time: 08/29/17  6:59 AM  Result Value Ref Range Status   Specimen Description   Final    URINE, CLEAN CATCH Performed at [facility], [address] [place], [place] [address]    Special Requests   Final    Normal Performed at [facility], [address] [place], [place] [address]    Culture   Final    NO  GROWTH Performed at [facility], [address] [place], [place] [address]    Report Status 08/30/2017 FINAL  Final  MRSA PCR Screening     Status: None   Collection Time: 08/29/17  1:51 PM  Result Value Ref Range Status   MRSA by PCR NEGATIVE NEGATIVE Final    Comment:        The GeneXpert MRSA Assay (FDA approved for NASAL specimens only), is one component of a comprehensive MRSA colonization surveillance program. It is not intended to diagnose MRSA infection nor to guide or monitor treatment for MRSA infections. Performed at [facility], [address] [place], [place] [address]   Culture, blood (routine x 2)     Status: None (Preliminary result)   Collection Time: 09/02/17  5:23 PM  Result Value Ref Range Status   Specimen Description   Final    BLOOD RIGHT HAND Performed at [facility], [address] [place], [place] [address]    Special Requests   Final    BOTTLES DRAWN AEROBIC AND ANAEROBIC Blood Culture adequate volume Performed at [facility], [address] [place], [place] [address]    Culture   Final    NO GROWTH < 24 HOURS Performed at [facility], [address] [place], [place] [address]    Report Status PENDING  Incomplete  Culture, blood (routine x 2)     Status: None (Preliminary result)   Collection Time: 09/02/17  5:24 PM  Result Value Ref Range Status   Specimen Description   Final    BLOOD LEFT HAND Performed at [facility], [address] [place], [place] [address]    Special Requests   Final    BOTTLES DRAWN AEROBIC AND ANAEROBIC Blood Culture adequate volume Performed at [facility], [address] [place], [place] [address]    Culture   Final    NO GROWTH < 24 HOURS Performed at [facility], [address] [place], [place] [address]    Report Status PENDING  Incomplete      Studies: No results  found.  Scheduled Meds: . albuterol  2.5 mg Nebulization TID  . buPROPion  200 mg Oral QAC breakfast  . carbidopa-levodopa  2 tablet Oral TID  . clonazePAM  0.5 mg Oral Daily  . enoxaparin (LOVENOX) injection  40 mg Subcutaneous Q24H  . guaiFENesin  1,200 mg Oral BID  . guaiFENesin-dextromethorphan  5 mL Oral QID  . hydrocortisone cream   Topical BID  . levothyroxine  150 mcg Oral QAC breakfast  . Melatonin  3 mg Oral QHS  . senna  1 tablet Oral QHS  . sodium chloride HYPERTONIC  4 mL Nebulization BID    Continuous Infusions: . sodium chloride 75 mL/hr at 09/03/17 0837  . ampicillin-sulbactam (UNASYN) IV Stopped (09/03/17 1047)     LOS: 5 days     Darlin Drop, MD [facility] Pager [phone]  If 7PM-7AM, please contact night-coverage [email or web address] Password [facility] 09/03/2017, 4:15 PM

## 2017-09-04 LAB — BASIC METABOLIC PANEL
Anion gap: 7 (ref 5–15)
BUN: 18 mg/dL (ref 6–20)
CHLORIDE: 112 mmol/L — AB (ref 101–111)
CO2: 26 mmol/L (ref 22–32)
Calcium: 8.5 mg/dL — ABNORMAL LOW (ref 8.9–10.3)
Creatinine, Ser: 0.78 mg/dL (ref 0.61–1.24)
GFR calc Af Amer: >60 mL/min (ref >60)
GFR calc non Af Amer: >60 mL/min (ref >60)
GLUCOSE: 124 mg/dL — AB (ref 65–99)
POTASSIUM: 3.7 mmol/L (ref 3.5–5.1)
Sodium: 145 mmol/L (ref 135–145)

## 2017-09-04 LAB — CBC
HCT: 33.8 % — ABNORMAL LOW (ref 39.0–52.0)
HEMOGLOBIN: 10.9 g/dL — AB (ref 13.0–17.0)
MCH: 31 pg (ref 26.0–34.0)
MCHC: 32.2 g/dL (ref 30.0–36.0)
MCV: 96 fL (ref 78.0–100.0)
PLATELETS: 200 10*3/uL (ref 150–400)
RBC: 3.52 MIL/uL — AB (ref 4.22–5.81)
RDW: 13.4 % (ref 11.5–15.5)
WBC: 13.2 10*3/uL — ABNORMAL HIGH (ref 4.0–10.5)

## 2017-09-04 MED ORDER — AMOXICILLIN-POT CLAVULANATE 875-125 MG PO TABS: 1.0000 | ORAL_TABLET | Freq: Two times a day (BID) | ORAL | 0 refills | Status: AC

## 2017-09-04 MED ORDER — ALBUTEROL SULFATE (2.5 MG/3ML) 0.083% IN NEBU: 2.5000 mg | INHALATION_SOLUTION | Freq: Three times a day (TID) | RESPIRATORY_TRACT | 0 refills | Status: AC

## 2017-09-04 MED ORDER — AMOXICILLIN-POT CLAVULANATE 875-125 MG PO TABS: 1.0000 | ORAL_TABLET | Freq: Two times a day (BID) | ORAL | Status: DC

## 2017-09-04 NOTE — Discharge Instructions (Signed)
Aspiration Pneumonia °Aspiration pneumonia is an infection in your lungs. It occurs when food, liquid, or stomach contents (vomit) are inhaled (aspirated) into your lungs. When these things get into your lungs, swelling (inflammation) and infection can occur. This can make it difficult for you to breathe. Aspiration pneumonia is a serious condition and can be life threatening. °What increases the risk? °Aspiration pneumonia is more likely to occur when a person's cough (gag) reflex or ability to swallow has been decreased. Some things that can do this include: °· Having a brain injury or disease, such as stroke, seizures, Parkinson's disease, dementia, or amyotrophic lateral sclerosis (ALS). °· Being given general anesthetic for procedures. °· Being in a coma (unconscious). °· Having a narrowing of the tube that carries food to the stomach (esophagus). °· Drinking too much alcohol. If a person passes out and vomits, vomit can be swallowed into the lungs. °· Taking certain medicines, such as tranquilizers or sedatives. ° °What are the signs or symptoms? °· Coughing after swallowing food or liquids. °· Breathing problems, such as wheezing or shortness of breath. °· Bluish skin. This can be caused by lack of oxygen. °· Coughing up food or mucus. The mucus might contain blood, greenish material, or yellowish-white fluid (pus). °· Fever. °· Chest pain. °· Being more tired than usual (fatigue). °· Sweating more than usual. °· Bad breath. °How is this diagnosed? °A physical exam will be done. During the exam, the health care provider will listen to your lungs with a stethoscope to check for: °· Crackling sounds in the lungs. °· Decreased breath sounds. °· A rapid heartbeat. ° °Various tests may be ordered. These may include: °· Chest X-ray. °· CT scan. °· Swallowing study. This test looks at how food is swallowed and whether it goes into your breathing tube (trachea) or food pipe (esophagus). °· Sputum culture. Saliva and  mucus (sputum) are collected from the lungs or the tubes that carry air to the lungs (bronchi). The sputum is then tested for bacteria. °· Bronchoscopy. This test uses a flexible tube (bronchoscope) to see inside the lungs. ° °How is this treated? °Treatment will usually include antibiotic medicines. Other medicines may also be used to reduce fever or pain. You may need to be treated in the hospital. In the hospital, your breathing will be carefully monitored. Depending on how well you are breathing, you may need to be given oxygen, or you may need breathing support from a breathing machine (ventilator). For people who fail a swallowing study, a feeding tube might be placed in the stomach, or they may be asked to avoid certain food textures or liquids when they eat. °Follow these instructions at home: °· Carefully follow any special eating instructions you were given, such as avoiding certain food textures or thickening liquids. This reduces the risk of developing aspiration pneumonia again. °· Only take over-the-counter or prescription medicines as directed by your health care provider. Follow the directions carefully. °· If you were prescribed antibiotics, take them as directed. Finish them even if you start to feel better. °· Rest as instructed by your health care provider. °· Keep all follow-up appointments with your health care provider. °Contact a health care provider if: °· You develop worsening shortness of breath, wheezing, or difficulty breathing. °· You develop a fever. °· You have chest pain. °This information is not intended to replace advice given to you by your health care provider. Make sure you discuss any questions you have with your health care   provider. °Document Released: 01/03/2009 Document Revised: 08/20/2015 Document Reviewed: 08/24/2012 °Elsevier Interactive Patient Education © 2017 [employer] ° °

## 2017-09-04 NOTE — Discharge Summary (Signed)
Discharge Summary  Kristopher Jennings ZOX:[id number] DOB: [birth year]  PCP: Renford Dills, MD  Admit date: 08/29/2017 Discharge date: 09/04/2017  Time spent: 25 minutes  Recommendations for Outpatient Follow-up:  1. Follow with PCP 2. Take your medications as prescribed 3. Fall precautions 4. Aspiration precaution  Discharge Diagnoses:  Active Hospital Problems   Diagnosis Date Noted  . Fever, unspecified 10/03/2013  . H/O: stroke 08/29/2017  . Debility 08/29/2017  . PNA (pneumonia) 08/29/2017  . HCAP (healthcare-associated pneumonia) 07/21/2016  . Aspiration pneumonia (HCC) 03/19/2016  . BPH (benign prostatic hyperplasia) 01/01/2014  . Urinary tract infection, site not specified 02/26/2013  . Parkinson's disease (HCC) 01/12/2013  . Hypothyroidism 12/13/2012    Resolved Hospital Problems  No resolved problems to display.    Discharge Condition: Stable  Diet recommendation: Very high risk for aspiration.  P.o. intake continues with known aspiration risk per patient/family wishes.  No feeding tube is desired.  Vitals:   09/04/17 0533 09/04/17 0856  BP: 111/67   Pulse: 73   Resp: 16   Temp: 98.4 F (36.9 C)   SpO2: 96% 94%    History of present illness:  Kristopher Jennings is a 77 year old male with past medical history significant for CVA, Parkinson's disease, recurrent aspiration pneumonia, BPH, hypothyroidism presented to the ED from SNF with a reported fever and highly suspected recurrent aspiration pneumonia. Admitted for aspiration PNA.  P.o. intake continues with known aspiration risk per patient/family wishes.  No feeding tube is desired, per speech therapy documentation.  09/01/17: seen and examined at his bedside. No acute distress. Unable to obtain ROS due to AMS. Alert but non verbal. CXR personally reviewed revealed RML infiltrates vs atelectasis w suspicion for aspiration. On dysphagia 3 diet per speech therapy.  09/02/2017: Patient seen and examined at his  bedside.  He is more alert today.  Has no new complaints.  No acute events overnight. Productive cough noted on exam.  Started pulmonary toilet.  09/03/17: No acute events overnight.  No new complaints.  Appears more alert and in no acute distress but minimally verbal.  09/04/2017: Patient seen and examined at his bedside he is much more alert.  When asked how he is feeling he mumbles fine.  No acute events overnight.  Afebrile.  On the day of discharge, the patient was hemodynamically stable.  He will need to follow-up with his primary care provider post hospitalization.    Hospital Course:  Principal Problem:   Fever, unspecified Active Problems:   Hypothyroidism   Parkinson's disease (HCC)   Urinary tract infection, site not specified   BPH (benign prostatic hyperplasia)   Aspiration pneumonia (HCC)   HCAP (healthcare-associated pneumonia)   H/O: stroke   Debility   PNA (pneumonia)  Acute metabolic encephalopathy, resolved Appears to be back to his baseline. Suspect multifactorial most likely due to acute infection from aspiration pneumonia vs hypovolemic hypernatremia vs others Reorient as needed Fall precautions  Recurrent aspiration pneumonia Completed IV cefepime and IV Unasyn Continue albuterol nebs Start Augmentin twice daily x7 days Follow-up with PCP Blood cultures x2 on 08/29/2017 and 09/02/2017 - negative to date  Hypovolemic hypernatremia, resolved Sodium 145 Received gentle IV hydration with half-normal saline at 75 cc/h during this presentation. Encourage increasing p.o. fluid intake as tolerated  Moderate protein calorie malnutrition Albumin 3.3 BMI 15 Very high risk of aspiration Speech therapy consulted and signed off  Hypokalemia, resolved Potassium 3.7 from K+ 3.4  repleted w 40 meq po kcl supplement  Parkinson disease  C/w home meds  AKI, resolved suspect prerenal 2/2 to dehydration Received IV fluid hydration Encourage oral fluid  intake Avoid nephrotoxic agents   Procedures:  None  Consultations:  Speech therapy  Discharge Exam: BP 111/67 (BP Location: Left Arm)   Pulse 73   Temp 98.4 F (36.9 C) (Oral)   Resp 16   Ht 6' (1.829 m)   Wt 52.2 kg (115 lb)   SpO2 94%   BMI 15.60 kg/m  . General: 77 y.o. year-old male frail with BMI of 15.  Alert and minimally verbal.  In no acute distress.   . Cardiovascular: Regular rate and rhythm with no rubs or gallops.  No thyromegaly or JVD noted.   Marland Kitchen Respiratory: Clear to auscultation with no wheezes or rales. Good inspiratory effort. . Abdomen: Soft nontender nondistended with normal bowel sounds x4 quadrants. . Musculoskeletal: No lower extremity edema. 2/4 pulses in all 4 extremities. . Skin: No ulcerative lesions noted or rashes, . Psychiatry: Mood is appropriate for condition and setting  Discharge Instructions You were cared for by a hospitalist during your hospital stay. If you have any questions about your discharge medications or the care you received while you were in the hospital after you are discharged, you can call the unit and asked to speak with the hospitalist on call if the hospitalist that took care of you is not available. Once you are discharged, your primary care physician will handle any further medical issues. Please note that NO REFILLS for any discharge medications will be authorized once you are discharged, as it is imperative that you return to your primary care physician (or establish a relationship with a primary care physician if you do not have one) for your aftercare needs so that they can reassess your need for medications and monitor your lab values.  Discharge Instructions    DME Nebulizer/meds   Complete by:  As directed    Patient needs a nebulizer to treat with the following condition:  Aspiration pneumonia (HCC)     Allergies as of 09/04/2017      Reactions   Demerol [meperidine] Other (See Comments)   Reaction:  Unknown      Lactose Intolerance (gi) Other (See Comments)   Reaction:  Unknown    Nickel Other (See Comments)   Reaction:  Unknown    Oxycodone Other (See Comments)   Reaction:  Unknown       Medication List    STOP taking these medications   acetaminophen 325 MG tablet Commonly known as:  TYLENOL   acetaminophen 650 MG suppository Commonly known as:  TYLENOL     TAKE these medications   albuterol (2.5 MG/3ML) 0.083% nebulizer solution Commonly known as:  PROVENTIL Take 3 mLs (2.5 mg total) by nebulization 3 (three) times daily.   amoxicillin-clavulanate 875-125 MG tablet Commonly known as:  AUGMENTIN Take 1 tablet by mouth every 12 (twelve) hours for 7 days.   buPROPion 200 MG 12 hr tablet Commonly known as:  WELLBUTRIN SR Take 200 mg by mouth daily before breakfast.   carbidopa-levodopa 25-100 MG tablet Commonly known as:  SINEMET IR Take 2 tablets by mouth 3 (three) times daily.   clonazePAM 0.5 MG tablet Commonly known as:  KLONOPIN Take 1 tablet (0.5 mg total) by mouth daily.   DIETARY MANAGEMENT PRODUCT PO Take 1 Container by mouth 3 (three) times daily. Magic Cup   levothyroxine 150 MCG tablet Commonly known as:  SYNTHROID, LEVOTHROID Take 150 mcg by  mouth daily before breakfast.   Melatonin 3 MG Tabs Take 3 mg by mouth at bedtime.   NUEDEXTA 20-10 MG Caps Generic drug:  Dextromethorphan-quiNIDine Take 1 capsule by mouth 2 (two) times daily.   senna 8.6 MG Tabs tablet Commonly known as:  SENOKOT Take 1 tablet by mouth at bedtime.   tamsulosin 0.4 MG Caps capsule Commonly known as:  FLOMAX Take 0.8 mg by mouth at bedtime.   Vitamin D 2000 units Caps Take 2,000 Units by mouth daily.            Durable Medical Equipment  (From admission, onward)        Start     Ordered   09/04/17 0000  DME Nebulizer/meds    Question:  Patient needs a nebulizer to treat with the following condition  Answer:  Aspiration pneumonia (HCC)   09/04/17 1053      Allergies  Allergen Reactions  . Demerol [Meperidine] Other (See Comments)    Reaction:  Unknown   . Lactose Intolerance (Gi) Other (See Comments)    Reaction:  Unknown   . Nickel Other (See Comments)    Reaction:  Unknown   . Oxycodone Other (See Comments)    Reaction:  Unknown    Follow-up Information    Renford DillsPolite, Ronald, MD. Call in 1 day(s).   Specialty:  Internal Medicine Why:  Please call for an appointment. Contact information: [address] [place] [address] [phone]            The results of significant diagnostics from this hospitalization (including imaging, microbiology, ancillary and laboratory) are listed below for reference.    Significant Diagnostic Studies: Dg Chest Port 1 View  Result Date: 08/29/2017 CLINICAL DATA:  Shortness of breath. EXAM: PORTABLE CHEST 1 VIEW COMPARISON:  07/20/2016 FINDINGS: Vagal stimulator on the left. The cardiomediastinal contours are normal. Prior right basilar opacity is near completely resolved with mild residual scarring. Mild left lung base atelectasis. Pulmonary vasculature is normal. No consolidation, pleural effusion, or pneumothorax. No acute osseous abnormalities are seen. IMPRESSION: Bibasilar atelectasis or scarring. Electronically Signed   By: Rubye OaksMelanie  Ehinger M.D.   On: 08/29/2017 06:57    Microbiology: Recent Results (from the past 240 hour(s))  Blood Culture (routine x 2)     Status: None   Collection Time: 08/29/17  4:57 AM  Result Value Ref Range Status   Specimen Description   Final    RIGHT ANTECUBITAL Performed at [facility], [address] [place], [place] [address]    Special Requests   Final    BOTTLES DRAWN AEROBIC AND ANAEROBIC Blood Culture adequate volume Performed at [facility], [address] [place], [place] [address]    Culture   Final    NO GROWTH 5 DAYS Performed at [facility], [address] [place] [address]    Report Status 09/03/2017 FINAL  Final  Blood Culture (routine x 2)     Status: None   Collection Time: 08/29/17  4:57 AM  Result Value Ref Range Status   Specimen Description   Final    BLOOD RIGHT ARM Performed at [facility], [address] [place], [place] [address]    Special Requests   Final    BOTTLES DRAWN AEROBIC AND ANAEROBIC Blood Culture adequate volume Performed at [facility], [address] [place], [place] [address]    Culture   Final  NO GROWTH 5 DAYS Performed at [facility], [address] [place], [place] [address]    Report Status 09/03/2017 FINAL  Final  Urine culture     Status: None   Collection Time: 08/29/17  6:59 AM  Result Value Ref Range Status   Specimen Description   Final    URINE, CLEAN CATCH Performed at [facility], [address], [place], [place] [address]    Special Requests   Final    Normal Performed at [facility], [address] [place], [place] [address]    Culture   Final    NO GROWTH Performed at [facility], [address] [place], [place] [address]    Report Status 08/30/2017 FINAL  Final  MRSA PCR Screening     Status: None   Collection Time: 08/29/17  1:51 PM  Result Value Ref Range Status   MRSA by PCR NEGATIVE NEGATIVE Final    Comment:        The GeneXpert MRSA Assay (FDA approved for NASAL specimens only), is one component of a comprehensive MRSA colonization surveillance program. It is not intended to diagnose MRSA infection nor to guide or monitor treatment for MRSA infections. Performed at [facility], [address] [place], [place] [address]   Culture, blood (routine x 2)     Status: None (Preliminary result)   Collection Time: 09/02/17  5:23 PM  Result Value Ref Range Status   Specimen Description   Final    BLOOD RIGHT HAND Performed at [facility],  [address] [place], [place] [address]    Special Requests   Final    BOTTLES DRAWN AEROBIC AND ANAEROBIC Blood Culture adequate volume Performed at [facility], [address] [place], [place] [address]    Culture   Final    NO GROWTH < 24 HOURS Performed at [facility], [address] [place], [place] [address]    Report Status PENDING  Incomplete  Culture, blood (routine x 2)     Status: None (Preliminary result)   Collection Time: 09/02/17  5:24 PM  Result Value Ref Range Status   Specimen Description   Final    BLOOD LEFT HAND Performed at [facility], [address] [place], [place] [address]    Special Requests   Final    BOTTLES DRAWN AEROBIC AND ANAEROBIC Blood Culture adequate volume Performed at [facility], [address], [place], [place] [address]    Culture   Final    NO GROWTH < 24 HOURS Performed at [facility], [address] [place], [place] [address]    Report Status PENDING  Incomplete     Labs: Basic Metabolic Panel: Recent Labs  Lab 08/30/17 0333 09/01/17 0955 09/02/17 0415 09/03/17 0442 09/04/17 0444  NA 155* 154* 152* 147* 145  K 3.5 3.4* 3.7 3.5 3.7  CL 123* 122* 120* 113* 112*  CO2 22 24 26 28 26   GLUCOSE 100* 133* 124* 117* 124*  BUN 34* 26* 18 18 18   CREATININE 1.27* 0.91 0.80 0.84 0.78  CALCIUM 9.1 8.6* 8.5* 8.3* 8.5*   Liver Function Tests: Recent Labs  Lab 08/29/17 0457  AST 22  ALT 5*  ALKPHOS 83  BILITOT 0.8  PROT 8.1  ALBUMIN 3.3*   No results for input(s): LIPASE, AMYLASE in the last 168 hours. No results for input(s): AMMONIA  in the last 168 hours. CBC: Recent Labs  Lab 08/29/17 0457  08/31/17 0001 09/01/17 0955 09/02/17 0415 09/03/17 0442 09/04/17 0444  WBC 17.5*   < > 16.8* 17.2* 16.9* 15.8* 13.2*  NEUTROABS 14.6*  --   --   --   --   --   --   HGB 14.5   < > 12.7* 12.6* 11.7* 11.1* 10.9*  HCT 44.2   < > 39.0 38.1* 35.5*  34.6* 33.8*  MCV 96.7   < > 96.8 96.9 96.5 97.2 96.0  PLT 199   < > 188 192 178 157 200   < > = values in this interval not displayed.   Cardiac Enzymes: No results for input(s): CKTOTAL, CKMB, CKMBINDEX, TROPONINI in the last 168 hours. BNP: BNP (last 3 results) No results for input(s): BNP in the last 8760 hours.  ProBNP (last 3 results) No results for input(s): PROBNP in the last 8760 hours.  CBG: No results for input(s): GLUCAP in the last 168 hours.     Signed:  Darlin Drop, MD [facility] 09/04/2017, 10:57 AM

## 2017-09-04 NOTE — Progress Notes (Addendum)
[facility] Anticipated discharge date: 6/16 Family notified: pt son Transportation by [employer]- called at 1:40pm  CSW signing off.  Burna SisJenna H. Rumor Sun, LCSW Clinical Social Worker [phone]

## 2017-09-07 LAB — CULTURE, BLOOD (ROUTINE X 2)
CULTURE: NO GROWTH
Culture: NO GROWTH
SPECIAL REQUESTS: ADEQUATE
SPECIAL REQUESTS: ADEQUATE

## 2017-10-04 ENCOUNTER — Non-Acute Institutional Stay: Payer: Medicare Other | Admitting: Hospice and Palliative Medicine

## 2017-10-04 DIAGNOSIS — Z515 Encounter for palliative care: Secondary | ICD-10-CM

## 2017-10-04 NOTE — Progress Notes (Signed)
PALLIATIVE CARE CONSULT VISIT   PATIENT NAME: Kristopher Jennings DOB: 1940/12/05 MRN: [id number]  PRIMARY CARE PROVIDER:   Renford DillsPolite, Ronald, MD  REFERRING PROVIDER:  Renford DillsPolite, Ronald, MD [address] [place], [place] [address]  RESPONSIBLE PARTY:   son  ASSESSMENT:     Patient seen and examined. He is currently working with OT and I was able to observe patient feeding himself. He remains high risk for aspiration, decline, and future rehospitalization. Note family meeting while in the hospital. In past conversations with me, son has opted to pursue rehab instead of hospice. I would recommend hospice upon completion of therapy services.   RECOMMENDATIONS and PLAN:  1. Would recommend hospice upon completion of therapy  I spent 10 minutes providing this consultation,  from 0800 to 0810. More than 50% of the time in this consultation was spent coordinating communication.   HISTORY OF PRESENT ILLNESS:  Kristopher Jennings is a 77 y.o. year old male with multiple medical problems including CVA, PD, recurrent aspiration, who was recently hospitalized 08/29/17 to 09/04/17 with aspiration PNA. Patient was seen in consultation by PMT at the hospital and we are asked to follow at SNF.   CODE STATUS: DNR  PPS: 30% HOSPICE ELIGIBILITY/DIAGNOSIS: YES  PAST MEDICAL HISTORY:  Past Medical History:  Diagnosis Date  . Anxiety   . Arthritis   . Ataxia   . Cauda equina syndrome (HCC)   . Depression   . Dysphagia   . Essential Tremors   . GERD (gastroesophageal reflux disease)   . Paralysis agitans (HCC) 01/12/2013  . Parkinson disease (HCC)   . Stroke (HCC)   . Thyroid disease   . UTI (lower urinary tract infection)     SOCIAL HX:  Social History   Tobacco Use  . Smoking status: Former Games developermoker  . Smokeless tobacco: Never Used  Substance Use Topics  . Alcohol use: No    ALLERGIES:  Allergies  Allergen Reactions  . Demerol [Meperidine] Other (See Comments)    Reaction:   Unknown   . Lactose Intolerance (Gi) Other (See Comments)    Reaction:  Unknown   . Nickel Other (See Comments)    Reaction:  Unknown   . Oxycodone Other (See Comments)    Reaction:  Unknown      PERTINENT MEDICATIONS:  Outpatient Encounter Medications as of 10/04/2017  Medication Sig  . albuterol (PROVENTIL) (2.5 MG/3ML) 0.083% nebulizer solution Take 3 mLs (2.5 mg total) by nebulization 3 (three) times daily.  Marland Kitchen. buPROPion (WELLBUTRIN SR) 200 MG 12 hr tablet Take 200 mg by mouth daily before breakfast.   . carbidopa-levodopa (SINEMET IR) 25-100 MG tablet Take 2 tablets by mouth 3 (three) times daily.  . Cholecalciferol (VITAMIN D) 2000 units CAPS Take 2,000 Units by mouth daily.   . clonazePAM (KLONOPIN) 0.5 MG tablet Take 1 tablet (0.5 mg total) by mouth daily.  Marland Kitchen. Dextromethorphan-Quinidine (NUEDEXTA) 20-10 MG CAPS Take 1 capsule by mouth 2 (two) times daily.  Marland Kitchen. DIETARY MANAGEMENT PRODUCT PO Take 1 Container by mouth 3 (three) times daily. Magic Cup  . levothyroxine (SYNTHROID, LEVOTHROID) 150 MCG tablet Take 150 mcg by mouth daily before breakfast.  . Melatonin 3 MG TABS Take 3 mg by mouth at bedtime.  . senna (SENOKOT) 8.6 MG TABS tablet Take 1 tablet by mouth at bedtime.  . tamsulosin (FLOMAX) 0.4 MG CAPS capsule Take 0.8 mg by mouth at bedtime.    No facility-administered encounter medications on file as  of 10/04/2017.     PHYSICAL EXAM:   General: NAD, frail appearing, thin Pulmonary: unlabored Extremities: no edema, no joint deformities Skin: no rashes Neurological: Weakness, tremors, follows simple commands  Malachy Moan, NP

## 2017-11-03 ENCOUNTER — Inpatient Hospital Stay (HOSPITAL_COMMUNITY): Payer: Medicare Other

## 2017-11-03 ENCOUNTER — Other Ambulatory Visit: Payer: Self-pay

## 2017-11-03 ENCOUNTER — Encounter (HOSPITAL_COMMUNITY): Payer: Self-pay | Admitting: Emergency Medicine

## 2017-11-03 ENCOUNTER — Inpatient Hospital Stay (HOSPITAL_COMMUNITY)
Admission: EM | Admit: 2017-11-03 | Discharge: 2017-11-09 | DRG: 871 | Disposition: A | Discharge status: Skilled Nursing Facility | Payer: Medicare Other | Attending: Family Medicine | Admitting: Family Medicine

## 2017-11-03 ENCOUNTER — Emergency Department (HOSPITAL_COMMUNITY): Payer: Medicare Other

## 2017-11-03 DIAGNOSIS — Z79899 Other long term (current) drug therapy: Secondary | ICD-10-CM | POA: Diagnosis not present

## 2017-11-03 DIAGNOSIS — Z681 Body mass index (BMI) 19 or less, adult: Secondary | ICD-10-CM | POA: Diagnosis not present

## 2017-11-03 DIAGNOSIS — A419 Sepsis, unspecified organism: Secondary | ICD-10-CM | POA: Diagnosis present

## 2017-11-03 DIAGNOSIS — R10819 Abdominal tenderness, unspecified site: Secondary | ICD-10-CM | POA: Diagnosis present

## 2017-11-03 DIAGNOSIS — Z87891 Personal history of nicotine dependence: Secondary | ICD-10-CM | POA: Diagnosis not present

## 2017-11-03 DIAGNOSIS — N400 Enlarged prostate without lower urinary tract symptoms: Secondary | ICD-10-CM | POA: Diagnosis present

## 2017-11-03 DIAGNOSIS — Z91048 Other nonmedicinal substance allergy status: Secondary | ICD-10-CM | POA: Diagnosis not present

## 2017-11-03 DIAGNOSIS — E739 Lactose intolerance, unspecified: Secondary | ICD-10-CM | POA: Diagnosis present

## 2017-11-03 DIAGNOSIS — R10817 Generalized abdominal tenderness: Secondary | ICD-10-CM

## 2017-11-03 DIAGNOSIS — Z7189 Other specified counseling: Secondary | ICD-10-CM | POA: Diagnosis not present

## 2017-11-03 DIAGNOSIS — Z66 Do not resuscitate: Secondary | ICD-10-CM | POA: Diagnosis present

## 2017-11-03 DIAGNOSIS — F419 Anxiety disorder, unspecified: Secondary | ICD-10-CM | POA: Diagnosis present

## 2017-11-03 DIAGNOSIS — R1312 Dysphagia, oropharyngeal phase: Secondary | ICD-10-CM | POA: Diagnosis present

## 2017-11-03 DIAGNOSIS — F0280 Dementia in other diseases classified elsewhere without behavioral disturbance: Secondary | ICD-10-CM | POA: Diagnosis present

## 2017-11-03 DIAGNOSIS — Z515 Encounter for palliative care: Secondary | ICD-10-CM | POA: Diagnosis present

## 2017-11-03 DIAGNOSIS — Z969 Presence of functional implant, unspecified: Secondary | ICD-10-CM | POA: Diagnosis present

## 2017-11-03 DIAGNOSIS — E039 Hypothyroidism, unspecified: Secondary | ICD-10-CM | POA: Diagnosis present

## 2017-11-03 DIAGNOSIS — E43 Unspecified severe protein-calorie malnutrition: Secondary | ICD-10-CM | POA: Diagnosis present

## 2017-11-03 DIAGNOSIS — J189 Pneumonia, unspecified organism: Secondary | ICD-10-CM | POA: Diagnosis not present

## 2017-11-03 DIAGNOSIS — Z885 Allergy status to narcotic agent status: Secondary | ICD-10-CM

## 2017-11-03 DIAGNOSIS — G20 Parkinson's disease: Secondary | ICD-10-CM | POA: Diagnosis present

## 2017-11-03 DIAGNOSIS — R4702 Dysphasia: Secondary | ICD-10-CM | POA: Diagnosis present

## 2017-11-03 DIAGNOSIS — Y95 Nosocomial condition: Secondary | ICD-10-CM | POA: Diagnosis present

## 2017-11-03 DIAGNOSIS — Z79890 Hormone replacement therapy (postmenopausal): Secondary | ICD-10-CM

## 2017-11-03 DIAGNOSIS — E876 Hypokalemia: Secondary | ICD-10-CM | POA: Diagnosis present

## 2017-11-03 DIAGNOSIS — F329 Major depressive disorder, single episode, unspecified: Secondary | ICD-10-CM | POA: Diagnosis present

## 2017-11-03 DIAGNOSIS — I10 Essential (primary) hypertension: Secondary | ICD-10-CM | POA: Diagnosis present

## 2017-11-03 DIAGNOSIS — J690 Pneumonitis due to inhalation of food and vomit: Secondary | ICD-10-CM | POA: Diagnosis present

## 2017-11-03 DIAGNOSIS — Z7401 Bed confinement status: Secondary | ICD-10-CM | POA: Diagnosis not present

## 2017-11-03 DIAGNOSIS — Z8673 Personal history of transient ischemic attack (TIA), and cerebral infarction without residual deficits: Secondary | ICD-10-CM

## 2017-11-03 DIAGNOSIS — R1310 Dysphagia, unspecified: Secondary | ICD-10-CM

## 2017-11-03 LAB — COMPREHENSIVE METABOLIC PANEL
ALBUMIN: 3.3 g/dL — AB (ref 3.5–5.0)
ALT: <5 U/L (ref 0–44)
ANION GAP: 15 (ref 5–15)
AST: 15 U/L (ref 15–41)
Alkaline Phosphatase: 85 U/L (ref 38–126)
BUN: 33 mg/dL — ABNORMAL HIGH (ref 8–23)
CHLORIDE: 111 mmol/L (ref 98–111)
CO2: 22 mmol/L (ref 22–32)
Calcium: 9.4 mg/dL (ref 8.9–10.3)
Creatinine, Ser: 1.07 mg/dL (ref 0.61–1.24)
GFR calc Af Amer: >60 mL/min (ref >60)
GFR calc non Af Amer: >60 mL/min (ref >60)
Glucose, Bld: 129 mg/dL — ABNORMAL HIGH (ref 70–99)
POTASSIUM: 3.9 mmol/L (ref 3.5–5.1)
SODIUM: 148 mmol/L — AB (ref 135–145)
Total Bilirubin: 1 mg/dL (ref 0.3–1.2)
Total Protein: 7.8 g/dL (ref 6.5–8.1)

## 2017-11-03 LAB — CBC WITH DIFFERENTIAL/PLATELET
Basophils Absolute: 0 10*3/uL (ref 0.0–0.1)
Basophils Relative: 0 %
Eosinophils Absolute: 0 10*3/uL (ref 0.0–0.7)
Eosinophils Relative: 0 %
HCT: 41 % (ref 39.0–52.0)
HEMOGLOBIN: 13.7 g/dL (ref 13.0–17.0)
LYMPHS ABS: 1.1 10*3/uL (ref 0.7–4.0)
LYMPHS PCT: 6 %
MCH: 31.4 pg (ref 26.0–34.0)
MCHC: 33.4 g/dL (ref 30.0–36.0)
MCV: 94 fL (ref 78.0–100.0)
MONO ABS: 2.1 10*3/uL — AB (ref 0.1–1.0)
MONOS PCT: 11 %
Neutro Abs: 16 10*3/uL — ABNORMAL HIGH (ref 1.7–7.7)
Neutrophils Relative %: 83 %
Platelets: 226 10*3/uL (ref 150–400)
RBC: 4.36 MIL/uL (ref 4.22–5.81)
RDW: 14.2 % (ref 11.5–15.5)
WBC: 19.2 10*3/uL — ABNORMAL HIGH (ref 4.0–10.5)

## 2017-11-03 LAB — PROCALCITONIN: Procalcitonin: 1.25 ng/mL

## 2017-11-03 LAB — LACTIC ACID, PLASMA: Lactic Acid, Venous: 2.3 mmol/L (ref 0.5–1.9)

## 2017-11-03 LAB — I-STAT CG4 LACTIC ACID, ED: LACTIC ACID, VENOUS: 1.38 mmol/L (ref 0.5–1.9)

## 2017-11-03 LAB — LIPASE, BLOOD: Lipase: 24 U/L (ref 11–51)

## 2017-11-03 MED ORDER — ONDANSETRON HCL 4 MG/2ML IJ SOLN: 4.0000 mg | Freq: Four times a day (QID) | INTRAMUSCULAR | Status: DC | PRN

## 2017-11-03 MED ORDER — LEVALBUTEROL HCL 1.25 MG/0.5ML IN NEBU: 1.2500 mg | INHALATION_SOLUTION | Freq: Four times a day (QID) | RESPIRATORY_TRACT | Status: DC

## 2017-11-03 MED ORDER — LACTATED RINGERS IV BOLUS
1000.0000 mL | Freq: Once | INTRAVENOUS | Status: AC
  Administered 2017-11-03: 1000 mL via INTRAVENOUS

## 2017-11-03 MED ORDER — HYDRALAZINE HCL 20 MG/ML IJ SOLN: 5.0000 mg | INTRAMUSCULAR | Status: DC | PRN

## 2017-11-03 MED ORDER — ENOXAPARIN SODIUM 40 MG/0.4ML ~~LOC~~ SOLN
40.0000 mg | SUBCUTANEOUS | Status: DC
  Administered 2017-11-03 – 2017-11-06 (×4): 40 mg via SUBCUTANEOUS
  Filled 2017-11-03 (×4): qty 0.4

## 2017-11-03 MED ORDER — LEVALBUTEROL HCL 1.25 MG/0.5ML IN NEBU: 1.2500 mg | INHALATION_SOLUTION | Freq: Four times a day (QID) | RESPIRATORY_TRACT | Status: DC | PRN

## 2017-11-03 MED ORDER — LEVOTHYROXINE SODIUM 100 MCG IV SOLR
75.0000 ug | Freq: Every day | INTRAVENOUS | Status: DC
  Administered 2017-11-04 – 2017-11-07 (×4): 75 ug via INTRAVENOUS
  Filled 2017-11-03 (×4): qty 5

## 2017-11-03 MED ORDER — MORPHINE SULFATE (PF) 2 MG/ML IV SOLN
0.5000 mg | INTRAVENOUS | Status: DC | PRN
  Administered 2017-11-07: 0.5 mg via INTRAVENOUS
  Filled 2017-11-03: qty 1

## 2017-11-03 MED ORDER — VANCOMYCIN HCL 10 G IV SOLR
1250.0000 mg | INTRAVENOUS | Status: DC
  Administered 2017-11-04 – 2017-11-05 (×2): 1250 mg via INTRAVENOUS
  Filled 2017-11-03 (×4): qty 1250

## 2017-11-03 MED ORDER — PIPERACILLIN-TAZOBACTAM 3.375 G IVPB
3.3750 g | Freq: Three times a day (TID) | INTRAVENOUS | Status: DC
  Administered 2017-11-04 – 2017-11-07 (×11): 3.375 g via INTRAVENOUS
  Filled 2017-11-03 (×10): qty 50

## 2017-11-03 MED ORDER — ACETAMINOPHEN 325 MG RE SUPP
325.0000 mg | Freq: Once | RECTAL | Status: AC
  Administered 2017-11-03: 325 mg via RECTAL
  Filled 2017-11-03: qty 1

## 2017-11-03 MED ORDER — SODIUM CHLORIDE 0.9 % IV SOLN
500.0000 mg | Freq: Once | INTRAVENOUS | Status: AC
  Administered 2017-11-03: 500 mg via INTRAVENOUS
  Filled 2017-11-03: qty 500

## 2017-11-03 MED ORDER — SODIUM CHLORIDE 0.9 % IV SOLN
INTRAVENOUS | Status: DC
  Administered 2017-11-04 (×2): via INTRAVENOUS

## 2017-11-03 MED ORDER — ACETAMINOPHEN 650 MG RE SUPP
650.0000 mg | Freq: Four times a day (QID) | RECTAL | Status: DC | PRN
  Administered 2017-11-04 (×2): 650 mg via RECTAL
  Filled 2017-11-03 (×2): qty 1

## 2017-11-03 MED ORDER — SODIUM CHLORIDE 0.9 % IV BOLUS
1000.0000 mL | Freq: Once | INTRAVENOUS | Status: AC
  Administered 2017-11-03: 1000 mL via INTRAVENOUS

## 2017-11-03 MED ORDER — PIPERACILLIN-TAZOBACTAM 3.375 G IVPB 30 MIN
3.3750 g | Freq: Once | INTRAVENOUS | Status: AC
  Administered 2017-11-03: 3.375 g via INTRAVENOUS
  Filled 2017-11-03: qty 50

## 2017-11-03 MED ORDER — ACETAMINOPHEN 325 MG PO TABS
650.0000 mg | ORAL_TABLET | Freq: Four times a day (QID) | ORAL | Status: DC | PRN
  Administered 2017-11-05: 650 mg via ORAL
  Filled 2017-11-03: qty 2

## 2017-11-03 MED ORDER — SODIUM CHLORIDE 0.9 % IV BOLUS
500.0000 mL | Freq: Once | INTRAVENOUS | Status: AC
  Administered 2017-11-03: 500 mL via INTRAVENOUS

## 2017-11-03 MED ORDER — ONDANSETRON HCL 4 MG PO TABS: 4.0000 mg | ORAL_TABLET | Freq: Four times a day (QID) | ORAL | Status: DC | PRN

## 2017-11-03 NOTE — H&P (Signed)
History and Physical    Kristopher MerinoRussell Jennings OZH:[id number] DOB: 02/26/1941 DOA: 11/03/2017  Referring MD/NP/PA:   PCP: Renford DillsPolite, Ronald, MD   Patient coming from:  The patient is coming from SNF.  At baseline, pt is dependent for most of ADL  Chief Complaint: fever  HPI: Kristopher MerinoRussell Jennings is a 77 y.o. male with medical history significant of Parkinson's disease, dysphasia, nonverbal status, essential tremor, BPH, stroke, GERD, hypothyroidism, depression, anxiety, recurrent aspiration pneumonia, who presents with fever.  Pt is nonverbal and cannot walk due to rigidity. Today pt was found to have fever in facility.  They did chest x-ray, which showed pneumonia.  Patient was given 1 dose of IM Rocephin injection and transferred to ED for further evaluation and treatment. Due to nonverbal status, it is difficult to get any history from the patient directly, therefore, most of the history is obtained by discussing the case with ED physician, per EMS report, and with the nursing staff. Pt seems to have mild cough, but no acute respiratory distress.  No active nausea, vomiting, diarrhea notated. When I saw pt in ED, he is alert, he shakes her head when asked if he has any chest pain. He seems to have central abdominal tenderness on physical examination. Per DEP which contacted SNF, pt's mental status is at his baseline.  ED Course: pt was found to have WBC 19.2, lactic acid of 1.38, sodium 148, creatinine 1.07, temperature 102.2, tachycardia, tachypnea, oxygen saturation 95% on 2 L nasal cannula oxygen.  Chest x-ray showed infiltration in LML and LLL.  Review of Systems: Could not be reviewed accurately due to nonverbal status.  Allergy:  Allergies  Allergen Reactions  . Demerol [Meperidine] Other (See Comments)    Reaction:  Unknown   . Lactose Intolerance (Gi) Other (See Comments)    Reaction:  Unknown   . Nickel Other (See Comments)    Reaction:  Unknown   . Oxycodone Other (See Comments)   Reaction:  Unknown     Past Medical History:  Diagnosis Date  . Anxiety   . Arthritis   . Ataxia   . Cauda equina syndrome (HCC)   . Depression   . Dysphagia   . Essential Tremors   . GERD (gastroesophageal reflux disease)   . Paralysis agitans (HCC) 01/12/2013  . Parkinson disease (HCC)   . Stroke (HCC)   . Thyroid disease   . UTI (lower urinary tract infection)     Past Surgical History:  Procedure Laterality Date  . IMPLANTATION / PLACEMENT EPIDURAL NEUROSTIMULATOR ELECTRODES Left   . LAMINECTOMY    . SPINE SURGERY      Social History:  reports that he has quit smoking. He has never used smokeless tobacco. He reports that he does not drink alcohol or use drugs.  Family History: Could not be reviewed due to nonverbal status.   Prior to Admission medications   Medication Sig Start Date End Date Taking? Authorizing Provider  albuterol (PROVENTIL) (2.5 MG/3ML) 0.083% nebulizer solution Take 3 mLs (2.5 mg total) by nebulization 3 (three) times daily. 09/04/17  Yes Hall, Carole N, DO  buPROPion (WELLBUTRIN SR) 200 MG 12 hr tablet Take 200 mg by mouth daily before breakfast.    Yes [provider]  carbidopa-levodopa (SINEMET IR) 25-100 MG tablet Take 2 tablets by mouth 3 (three) times daily.   Yes [provider]  cefTRIAXone 1 g/4 mLs in lidocaine (with preservative) injection Inject 1 g into the muscle once. Was given at facility  before coming to the Hospital   Yes [provider]  Cholecalciferol (VITAMIN D) 2000 units CAPS Take 2,000 Units by mouth daily.    Yes [provider]  clonazePAM (KLONOPIN) 0.5 MG tablet Take 1 tablet (0.5 mg total) by mouth daily. Patient taking differently: Take 0.25 mg by mouth daily.  07/24/16  Yes Mikhail, Nita Sells, DO  Dextromethorphan-Quinidine (NUEDEXTA) 20-10 MG CAPS Take 1 capsule by mouth 2 (two) times daily.   Yes [provider]  DIETARY MANAGEMENT PRODUCT PO Take 1 Container by mouth 3 (three)  times daily. Magic Cup   Yes [provider]  levothyroxine (SYNTHROID, LEVOTHROID) 150 MCG tablet Take 150 mcg by mouth daily before breakfast.   Yes [provider]  Melatonin 3 MG TABS Take 3 mg by mouth at bedtime.   Yes [provider]  senna (SENOKOT) 8.6 MG TABS tablet Take 1 tablet by mouth at bedtime.   Yes [provider]  tamsulosin (FLOMAX) 0.4 MG CAPS capsule Take 0.8 mg by mouth at bedtime.    Yes [provider]    Physical Exam: Vitals:   11/03/17 1745 11/03/17 1800 11/03/17 1815 11/03/17 1830  BP: 123/79 (!) 129/115 126/74 108/64  Pulse: (!) 109 96 (!) 109 (!) 102  Resp: (!) 25 (!) 28 (!) 32 (!) 34  Temp:      TempSrc:      SpO2: 98% 95% 97% 97%   General: Not in acute distress HEENT:       Eyes: PERRL, EOMI, no scleral icterus.       ENT: No discharge from the ears and nose, no pharynx injection, no tonsillar enlargement.        Neck: No JVD, no bruit, no mass felt. Heme: No neck lymph node enlargement. Cardiac: S1/S2, RRR, No murmurs, No gallops or rubs. Respiratory: Good air movement bilaterally. No rales, wheezing, rhonchi or rubs. GI: Soft, nondistended, nontender, no rebound pain, no organomegaly, BS present. GU: No hematuria Ext: No pitting leg edema bilaterally. 2+DP/PT pulse bilaterally. Musculoskeletal: No joint deformities, No joint redness or warmth, no limitation of ROM in spin. Skin: No rashes.  Neuro: Alert, oriented X3, cranial nerves II-XII grossly intact, moves all extremities normally. Muscle strength 5/5 in all extremities, sensation to light touch intact. Brachial reflex 2+ bilaterally. Knee reflex 1+ bilaterally. Negative Babinski's sign. Normal finger to nose test. Psych: Patient is not psychotic, no suicidal or hemocidal ideation.  Labs on Admission: I have personally reviewed following labs and imaging studies  CBC: Recent Labs  Lab 11/03/17 1827  WBC 19.2*  NEUTROABS 16.0*  HGB 13.7  HCT  41.0  MCV 94.0  PLT 226   Basic Metabolic Panel: Recent Labs  Lab 11/03/17 1827  NA 148*  K 3.9  CL 111  CO2 22  GLUCOSE 129*  BUN 33*  CREATININE 1.07  CALCIUM 9.4   GFR: CrCl cannot be calculated (Unknown ideal weight.). Liver Function Tests: Recent Labs  Lab 11/03/17 1827  AST 15  ALT <5  ALKPHOS 85  BILITOT 1.0  PROT 7.8  ALBUMIN 3.3*   No results for input(s): LIPASE, AMYLASE in the last 168 hours. No results for input(s): AMMONIA in the last 168 hours. Coagulation Profile: No results for input(s): INR, PROTIME in the last 168 hours. Cardiac Enzymes: No results for input(s): CKTOTAL, CKMB, CKMBINDEX, TROPONINI in the last 168 hours. BNP (last 3 results) No results for input(s): PROBNP in the last 8760 hours. HbA1C: No results for  input(s): HGBA1C in the last 72 hours. CBG: No results for input(s): GLUCAP in the last 168 hours. Lipid Profile: No results for input(s): CHOL, HDL, LDLCALC, TRIG, CHOLHDL, LDLDIRECT in the last 72 hours. Thyroid Function Tests: No results for input(s): TSH, T4TOTAL, FREET4, T3FREE, THYROIDAB in the last 72 hours. Anemia Panel: No results for input(s): VITAMINB12, FOLATE, FERRITIN, TIBC, IRON, RETICCTPCT in the last 72 hours. Urine analysis:    Component Value Date/Time   COLORURINE AMBER (A) 08/29/2017 0659   APPEARANCEUR CLOUDY (A) 08/29/2017 0659   LABSPEC 1.018 08/29/2017 0659   PHURINE 7.0 08/29/2017 0659   GLUCOSEU NEGATIVE 08/29/2017 0659   HGBUR SMALL (A) 08/29/2017 0659   BILIRUBINUR NEGATIVE 08/29/2017 0659   KETONESUR 5 (A) 08/29/2017 0659   PROTEINUR 100 (A) 08/29/2017 0659   NITRITE NEGATIVE 08/29/2017 0659   LEUKOCYTESUR LARGE (A) 08/29/2017 0659   Sepsis Labs: @LABRCNTIP (procalcitonin:4,lacticidven:4) )No results found for this or any previous visit (from the past 240 hour(s)).   Radiological Exams on Admission: Dg Chest Port 1 View  Result Date: 11/03/2017 CLINICAL DATA:  Fever EXAM: PORTABLE CHEST 1  VIEW COMPARISON:  08/29/2017 FINDINGS: Vagal stimulator noted on the left, unchanged. Heart is normal size. Airspace disease noted in the left mid and lower lung compatible with pneumonia. Right lung clear. No effusions or acute bony abnormality. IMPRESSION: Left mid and lower lung airspace disease compatible with pneumonia. Electronically Signed   By: Charlett Nose M.D.   On: 11/03/2017 17:48     EKG: Independently reviewed.  Sinus rhythm, QTC 427, LAD, nonspecific T wave change.  Assessment/Plan Principal Problem:   Sepsis (HCC) Active Problems:   Hypothyroidism   Essential hypertension, benign   BPH (benign prostatic hyperplasia)   Aspiration pneumonia (HCC)   HCAP (healthcare-associated pneumonia)   Dysphagia   Abdominal tenderness   Sepsis [facility]): Patient meets culture for sepsis with leukocytosis, fever, tachycardia and tachypnea.  Lactic acid is normal.  Currently hemodynamically stable.  Source of infection is likely due to aspiration pneumonia versus HCAP.  Since patient has central abdominal tenderness, will also need to rule out acute abdomen.  -will admit to tele bed as inpt -Abx: Patient received 1 dose of IM Rocephin injection in facility, and a 1 dose of azithromycin in the ED, will switch to vancomycin and Zosyn -will get Procalcitonin and trend lactic acid levels per sepsis protocol. -IVF: 1L of ringer solution in ED, will give 1.5 L of NS bolus in ED, followed by 125 cc/h  -f/u Bx and Ux and UA  Aspiration pneumonia vs. HCAP: Chest x-ray showed infiltration in left lower and left middle lobe.  Patient has history of Parkinson's disease and dysphagia, and high risk of developing aspiration pneumonia.  We cannot completely rule out HCAP. - IV Vancomycin and Zosyn as above - Xopenex Neb prn for SOB - Urine legionella and S. pneumococcal antigen - Follow up blood culture x2, sputum culture - cannot give oral Mucinex for cough due to dysphagia, pending SLP - f/u  SLP  Hypothyroidism: Last TSH was not on record -switch home Synthroid to IV Synthroid, cut the dose from 150 to 75 mcg daily  Essential hypertension, benign: -hold blood pressure medications due to dysphagia and risk of developing hypotension due to sepsis. -IV hydralazine as needed  BPH (benign prostatic hyperplasia): -hold oral Flomax  Dysphagia: -f/u SLP  Parkinson's disease: -Hold home Sensipar now  Abdominal tenderness: Etiology is not clear.  No active nausea vomiting or diarrhea noted. -Follow-up  CT abdomen/pelvis -check lipase -PRN Zofran for nausea, -PRN morphine for pain   Inpatient status:  # Patient requires inpatient status due to high intensity of service, high risk for further deterioration and high frequency of surveillance required.  I certify that at the point of admission it is my clinical judgment that the patient will require inpatient hospital care spanning beyond 2 midnights from the point of admission.   This patient has multiple chronic comorbidities including Parkinson's disease, dysphasia, nonverbal status, essential tremor, BPH, stroke, GERD, hypothyroidism, depression, anxiety, recurrent aspiration pneumonia  Now patient has presenting symptoms include Fever, abdominal tenderness.  Patient meets criteria for sepsis with leukocytosis, fever, tachycardia and tachypnea.  The worrisome physical exam findings include abdominal tenderness.  The initial radiographic and laboratory data are worrisome because patient meets criteria for sepsis and infiltration of LML and LLL on chest x-ray for pneumonia.  Current medical needs: please see my assessment and plan     DVT ppx: SQ Heparin   Code Status: DNR (EDP, dr. Lucrezia Starchurutolo has contacted the facility, and confirmed that pt is DNR) Family Communication: None at bed side. Disposition Plan:  Anticipate discharge back to previous SNF Consults called:  none Admission status: Inpatient/tele    Date of  Service 11/03/2017    Lorretta HarpXilin Kerstyn Coryell [facility] Pager [phone]  If 7PM-7AM, please contact night-coverage [email or web address] Password [email or web address] 11/03/2017, 8:47 PM

## 2017-11-03 NOTE — ED Notes (Signed)
ED TO INPATIENT HANDOFF REPORT  Name/Age/Gender Kristopher Jennings 77 y.o. male  Code Status Code Status History    Date Active Date Inactive Code Status Order ID Comments User Context   08/29/2017 0911 09/04/2017 1809 DNR [id number]  Shelly Coss, MD ED   07/21/2016 0548 07/24/2016 1929 DNR [id number]  Schorr, Rhetta Mura, NP Inpatient   07/21/2016 0157 07/21/2016 0548 Full Code [id number]  Lily Kocher, MD ED   07/21/2016 0152 07/21/2016 0157 DNR [id number]  Lily Kocher, MD ED   03/21/2016 0834 03/23/2016 1805 DNR [id number]  Geradine Girt, DO Inpatient   03/19/2016 1620 03/21/2016 0834 Full Code [id number]  Samuella Cota, MD Inpatient    Questions for Most Recent Historical Code Status (Order [id number])    Question Answer Comment   In the event of cardiac or respiratory ARREST Do not call a "code blue"    In the event of cardiac or respiratory ARREST Do not perform Intubation, CPR, defibrillation or ACLS    In the event of cardiac or respiratory ARREST Use medication by any route, position, wound care, and other measures to relive pain and suffering. May use oxygen, suction and manual treatment of airway obstruction as needed for comfort.       Home/SNF/Other Nursing Home  Chief Complaint Fever, pneumonia  Level of Care/Admitting Diagnosis ED Disposition    ED Disposition Condition Comment   Admit  The patient appears reasonably stabilized for admission considering the current resources, flow, and capabilities available in the ED at this time, and I doubt any other [facility] requiring further screening and/or treatment in the ED prior to admission is  present.       Medical History Past Medical History:  Diagnosis Date  . Anxiety   . Arthritis   . Ataxia   . Cauda equina syndrome ([place])   . Depression   . Dysphagia   . Essential Tremors   . GERD (gastroesophageal reflux disease)   . Paralysis agitans ([place]) 01/12/2013  . Parkinson disease ([place])   . Stroke ([place])   . Thyroid  disease   . UTI (lower urinary tract infection)     Allergies Allergies  Allergen Reactions  . Demerol [Meperidine] Other (See Comments)    Reaction:  Unknown   . Lactose Intolerance (Gi) Other (See Comments)    Reaction:  Unknown   . Nickel Other (See Comments)    Reaction:  Unknown   . Oxycodone Other (See Comments)    Reaction:  Unknown     IV Location/Drains/Wounds Patient Lines/Drains/Airways Status   Active Line/Drains/Airways    Name:   Placement date:   Placement time:   Site:   Days:   Peripheral IV 11/03/17 Right Forearm   11/03/17    -    Forearm   less than 1   Peripheral IV 11/03/17 Left Forearm   11/03/17    1813    Forearm   less than 1   External Urinary Catheter   08/29/17    0456    -   66   Pressure Injury 03/19/16 Stage I -  Intact skin with non-blanchable redness of a localized area usually over a bony prominence.   03/19/16    1430     594          Labs/Imaging Results for orders placed or performed during the hospital encounter of 11/03/17 (from the past 48 hour(s))  I-Stat CG4 Lactic Acid, ED  (not at  [facility])  Status: None   Collection Time: 11/03/17  6:18 PM  Result Value Ref Range   Lactic Acid, Venous 1.38 0.5 - 1.9 mmol/L  Comprehensive metabolic panel     Status: Abnormal   Collection Time: 11/03/17  6:27 PM  Result Value Ref Range   Sodium 148 (H) 135 - 145 mmol/L   Potassium 3.9 3.5 - 5.1 mmol/L   Chloride 111 98 - 111 mmol/L   CO2 22 22 - 32 mmol/L   Glucose, Bld 129 (H) 70 - 99 mg/dL   BUN 33 (H) 8 - 23 mg/dL   Creatinine, Ser 1.07 0.61 - 1.24 mg/dL   Calcium 9.4 8.9 - 10.3 mg/dL   Total Protein 7.8 6.5 - 8.1 g/dL   Albumin 3.3 (L) 3.5 - 5.0 g/dL   AST 15 15 - 41 U/L   ALT <5 0 - 44 U/L   Alkaline Phosphatase 85 38 - 126 U/L   Total Bilirubin 1.0 0.3 - 1.2 mg/dL   GFR calc non Af Amer >60 >60 mL/min   GFR calc Af Amer >60 >60 mL/min    Comment: (NOTE) The eGFR has been calculated using the CKD EPI equation. This calculation  has not been validated in all clinical situations. eGFR's persistently <60 mL/min signify possible Chronic Kidney Disease.    Anion gap 15 5 - 15    Comment: Performed at [facility], [place] [address] [place], [place] [address]  CBC WITH DIFFERENTIAL     Status: Abnormal   Collection Time: 11/03/17  6:27 PM  Result Value Ref Range   WBC 19.2 (H) 4.0 - 10.5 K/uL   RBC 4.36 4.22 - 5.81 MIL/uL   Hemoglobin 13.7 13.0 - 17.0 g/dL   HCT 41.0 39.0 - 52.0 %   MCV 94.0 78.0 - 100.0 fL   MCH 31.4 26.0 - 34.0 pg   MCHC 33.4 30.0 - 36.0 g/dL   RDW 14.2 11.5 - 15.5 %   Platelets 226 150 - 400 K/uL   Neutrophils Relative % 83 %   Neutro Abs 16.0 (H) 1.7 - 7.7 K/uL   Lymphocytes Relative 6 %   Lymphs Abs 1.1 0.7 - 4.0 K/uL   Monocytes Relative 11 %   Monocytes Absolute 2.1 (H) 0.1 - 1.0 K/uL   Eosinophils Relative 0 %   Eosinophils Absolute 0.0 0.0 - 0.7 K/uL   Basophils Relative 0 %   Basophils Absolute 0.0 0.0 - 0.1 K/uL    Comment: Performed at [facility], [place] [address] [place], [place] [address]   Dg Chest Port 1 View  Result Date: 11/03/2017 CLINICAL DATA:  Fever EXAM: PORTABLE CHEST 1 VIEW COMPARISON:  08/29/2017 FINDINGS: Vagal stimulator noted on the left, unchanged. Heart is normal size. Airspace disease noted in the left mid and lower lung compatible with pneumonia. Right lung clear. No effusions or acute bony abnormality. IMPRESSION: Left mid and lower lung airspace disease compatible with pneumonia. Electronically Signed   By: Rolm Baptise M.D.   On: 11/03/2017 17:48    Pending Labs Unresulted Labs (From admission, onward)    Start     Ordered   11/03/17 1731  Urine culture  STAT,   STAT     11/03/17 1730   11/03/17 1730  Blood Culture (routine x 2)  BLOOD CULTURE X 2,   STAT     11/03/17 1730   11/03/17 1730  Urinalysis, Routine w reflex microscopic  STAT,   STAT  11/03/17 1730          Vitals/Pain Today's Vitals   11/03/17  1745 11/03/17 1800 11/03/17 1815 11/03/17 1830  BP: 123/79 (!) 129/115 126/74 108/64  Pulse: (!) 109 96 (!) 109 (!) 102  Resp: (!) 25 (!) 28 (!) 32 (!) 34  Temp:      TempSrc:      SpO2: 98% 95% 97% 97%  PainSc:        Isolation Precautions No active isolations  Medications Medications  lactated ringers bolus 1,000 mL (1,000 mLs Intravenous New Bag/Given 11/03/17 1817)  acetaminophen (TYLENOL) suppository 325 mg (325 mg Rectal Given 11/03/17 1820)  azithromycin (ZITHROMAX) 500 mg in sodium chloride 0.9 % 250 mL IVPB (500 mg Intravenous New Bag/Given 11/03/17 1835)    Mobility non-ambulatory

## 2017-11-03 NOTE — ED Provider Notes (Addendum)
[facility]-EMERGENCY DEPT Provider Note   CSN: [id number] Arrival date & time: 11/03/17  1659     History   Chief Complaint Chief Complaint  Patient presents with  . Fever    HPI Kristopher Jennings is a 77 y.o. male.  Patient is a 77 year old male with history of Parkinson's disease, stroke who is nonverbal at baseline.  History obtained by EMS.  Patient was diagnosed with pneumonia at nursing home today and given IV Rocephin and Tylenol prior to arrival.  Level 5 caveat as unable to obtain adequate history and physical secondary to patient's dementia.  The history is provided by the EMS personnel.  Fever   This is a new problem. The current episode started 6 to 12 hours ago. The problem occurs constantly. The problem has not changed since onset.The maximum temperature noted was 102 to 102.9 F. He has tried acetaminophen for the symptoms.    Past Medical History:  Diagnosis Date  . Anxiety   . Arthritis   . Ataxia   . Cauda equina syndrome (HCC)   . Depression   . Dysphagia   . Essential Tremors   . GERD (gastroesophageal reflux disease)   . Paralysis agitans (HCC) 01/12/2013  . Parkinson disease (HCC)   . Stroke (HCC)   . Thyroid disease   . UTI (lower urinary tract infection)     Patient Active Problem List   Diagnosis Date Noted  . Sepsis (HCC) 11/03/2017  . Dysphagia 11/03/2017  . Abdominal tenderness 11/03/2017  . H/O: stroke 08/29/2017  . Debility 08/29/2017  . PNA (pneumonia) 08/29/2017  . Protein-calorie malnutrition, severe 07/23/2016  . HCAP (healthcare-associated pneumonia) 07/21/2016  . Encounter for palliative care   . Goals of care, counseling/discussion   . Pressure injury of skin 03/20/2016  . Lobar pneumonia (HCC) 03/19/2016  . Acute encephalopathy 03/19/2016  . Aspiration pneumonia (HCC) 03/19/2016  . BPH (benign prostatic hyperplasia) 01/01/2014  . Thyroid activity decreased 10/04/2013  . Fever, unspecified 10/03/2013   . Unspecified constipation 10/02/2013  . Other specified disease of white blood cells 10/02/2013  . Cough 05/03/2013  . Urinary tract infection, site not specified 02/26/2013  . Urinary hesitancy 02/19/2013  . Parkinson's disease (HCC) 01/12/2013  . Essential hypertension, benign 01/08/2013  . Hypertrophy of prostate without urinary obstruction and other lower urinary tract symptoms (LUTS) 01/08/2013  . Anxiety state, unspecified 01/08/2013  . Benign essential tremor 12/13/2012  . H/O urinary retention 12/13/2012  . Hypothyroidism 12/13/2012  . Insomnia 12/13/2012    Past Surgical History:  Procedure Laterality Date  . IMPLANTATION / PLACEMENT EPIDURAL NEUROSTIMULATOR ELECTRODES Left   . LAMINECTOMY    . SPINE SURGERY          Home Medications    Prior to Admission medications   Medication Sig Start Date End Date Taking? Authorizing Provider  albuterol (PROVENTIL) (2.5 MG/3ML) 0.083% nebulizer solution Take 3 mLs (2.5 mg total) by nebulization 3 (three) times daily. 09/04/17  Yes Hall, Carole N, DO  buPROPion (WELLBUTRIN SR) 200 MG 12 hr tablet Take 200 mg by mouth daily before breakfast.    Yes [provider]  carbidopa-levodopa (SINEMET IR) 25-100 MG tablet Take 2 tablets by mouth 3 (three) times daily.   Yes [provider]  cefTRIAXone 1 g/4 mLs in lidocaine (with preservative) injection Inject 1 g into the muscle once. Was given at facility before coming to the Hospital   Yes [provider]  Cholecalciferol (VITAMIN D) 2000 units  CAPS Take 2,000 Units by mouth daily.    Yes [provider]  clonazePAM (KLONOPIN) 0.5 MG tablet Take 1 tablet (0.5 mg total) by mouth daily. Patient taking differently: Take 0.25 mg by mouth daily.  07/24/16  Yes Mikhail, Nita SellsMaryann, DO  Dextromethorphan-Quinidine (NUEDEXTA) 20-10 MG CAPS Take 1 capsule by mouth 2 (two) times daily.   Yes [provider]  DIETARY MANAGEMENT PRODUCT PO Take 1 Container by  mouth 3 (three) times daily. Magic Cup   Yes [provider]  levothyroxine (SYNTHROID, LEVOTHROID) 150 MCG tablet Take 150 mcg by mouth daily before breakfast.   Yes [provider]  Melatonin 3 MG TABS Take 3 mg by mouth at bedtime.   Yes [provider]  senna (SENOKOT) 8.6 MG TABS tablet Take 1 tablet by mouth at bedtime.   Yes [provider]  tamsulosin (FLOMAX) 0.4 MG CAPS capsule Take 0.8 mg by mouth at bedtime.    Yes [provider]    Family History History reviewed. No pertinent family history.  Social History Social History   Tobacco Use  . Smoking status: Former Games developermoker  . Smokeless tobacco: Never Used  Substance Use Topics  . Alcohol use: No  . Drug use: No     Allergies   Demerol [meperidine]; Lactose intolerance (gi); Nickel; and Oxycodone   Review of Systems Review of Systems  Unable to perform ROS: Patient nonverbal  Constitutional: Positive for fever.     Physical Exam Updated Vital Signs  ED Triage Vitals  Enc Vitals Group     BP 11/03/17 1713 119/72     Pulse Rate 11/03/17 1713 (!) 108     Resp 11/03/17 1713 (!) 33     Temp 11/03/17 1713 (!) 102.2 F (39 C)     Temp Source 11/03/17 1713 Rectal     SpO2 11/03/17 1711 95 %     Weight --      Height --      Head Circumference --      Peak Flow --      Pain Score 11/03/17 1714 0     Pain Loc --      Pain Edu? --      Excl. in GC? --     Physical Exam  Constitutional: He appears well-developed and well-nourished.  HENT:  Head: Normocephalic and atraumatic.  Eyes: Pupils are equal, round, and reactive to light. Conjunctivae and EOM are normal.  Neck: Normal range of motion. Neck supple.  Cardiovascular: Regular rhythm, normal heart sounds and intact distal pulses. Tachycardia present.  No murmur heard. Pulmonary/Chest: Effort normal and breath sounds normal. No respiratory distress.  Diminished breath sounds throughout, patient on 2 L of  oxygen, baseline  Abdominal: Soft. There is no tenderness. There is no guarding.  Musculoskeletal: Normal range of motion. He exhibits no edema.  Neurological: He is alert.  Skin: Skin is warm and dry.  Psychiatric: He has a normal mood and affect.  Nursing note and vitals reviewed.    ED Treatments / Results  Labs (all labs ordered are listed, but only abnormal results are displayed) Labs Reviewed  COMPREHENSIVE METABOLIC PANEL - Abnormal; Notable for the following components:      Result Value   Sodium 148 (*)    Glucose, Bld 129 (*)    BUN 33 (*)    Albumin 3.3 (*)    All other components within normal limits  CBC WITH DIFFERENTIAL/PLATELET - Abnormal; Notable for the  following components:   WBC 19.2 (*)    Neutro Abs 16.0 (*)    Monocytes Absolute 2.1 (*)    All other components within normal limits  LACTIC ACID, PLASMA - Abnormal; Notable for the following components:   Lactic Acid, Venous 2.3 (*)    All other components within normal limits  CULTURE, BLOOD (ROUTINE X 2)  CULTURE, BLOOD (ROUTINE X 2)  URINE CULTURE  EXPECTORATED SPUTUM ASSESSMENT W REFEX TO RESP CULTURE  GRAM STAIN  PROCALCITONIN  LIPASE, BLOOD  URINALYSIS, ROUTINE W REFLEX MICROSCOPIC  PROTIME-INR  LACTIC ACID, PLASMA  HIV ANTIBODY (ROUTINE TESTING)  STREP PNEUMONIAE URINARY ANTIGEN  LEGIONELLA PNEUMOPHILA SEROGP 1 UR AG  CREATININE, SERUM  I-STAT CG4 LACTIC ACID, ED    EKG EKG Interpretation  Date/Time:  Thursday November 03 2017 17:10:04 EDT Ventricular Rate:  109 PR Interval:    QRS Duration: 98 QT Interval:  317 QTC Calculation: 427 R Axis:   -80 Text Interpretation:  Sinus tachycardia Inferior infarct, old Anterior infarct, old Confirmed by Virgina Norfolk [id number]) on 11/03/2017 6:00:57 PM   Radiology   Procedures .Critical Care Performed by: Virgina Norfolk, DO Authorized by: Virgina Norfolk, DO   Critical care provider statement:    Critical care time (minutes):  35    Critical care was necessary to treat or prevent imminent or life-threatening deterioration of the following conditions:  Sepsis   Critical care was time spent personally by me on the following activities:  Development of treatment plan with patient or surrogate, discussions with primary provider, evaluation of patient's response to treatment, examination of patient, ordering and performing treatments and interventions, ordering and review of laboratory studies, ordering and review of radiographic studies, pulse oximetry, re-evaluation of patient's condition and review of old charts   I assumed direction of critical care for this patient from another provider in my specialty: no     (including critical care time)  Medications Ordered in ED Medications  0.9 %  sodium chloride infusion (has no administration in time range)  enoxaparin (LOVENOX) injection 40 mg (40 mg Subcutaneous Given 11/03/17 2354)  acetaminophen (TYLENOL) tablet 650 mg (has no administration in time range)    Or  acetaminophen (TYLENOL) suppository 650 mg (has no administration in time range)  ondansetron (ZOFRAN) tablet 4 mg (has no administration in time range)    Or  ondansetron (ZOFRAN) injection 4 mg (has no administration in time range)  levothyroxine (SYNTHROID, LEVOTHROID) injection 75 mcg (has no administration in time range)  hydrALAZINE (APRESOLINE) injection 5 mg (has no administration in time range)  piperacillin-tazobactam (ZOSYN) IVPB 3.375 g (has no administration in time range)  vancomycin (VANCOCIN) 1,250 mg in sodium chloride 0.9 % 250 mL IVPB (has no administration in time range)  morphine 2 MG/ML injection 0.5 mg (has no administration in time range)  levalbuterol (XOPENEX) nebulizer solution 1.25 mg (has no administration in time range)  lactated ringers bolus 1,000 mL (0 mLs Intravenous Stopped 11/03/17 2004)  acetaminophen (TYLENOL) suppository 325 mg (325 mg Rectal Given 11/03/17 1820)  azithromycin  (ZITHROMAX) 500 mg in sodium chloride 0.9 % 250 mL IVPB (0 mg Intravenous Stopped 11/03/17 2005)  sodium chloride 0.9 % bolus 1,000 mL (1,000 mLs Intravenous New Bag/Given 11/03/17 2008)  sodium chloride 0.9 % bolus 500 mL (500 mLs Intravenous New Bag/Given 11/03/17 2358)  piperacillin-tazobactam (ZOSYN) IVPB 3.375 g (3.375 g Intravenous New Bag/Given 11/03/17 2047)     Initial Impression / Assessment and Plan / ED Course  I have reviewed the triage vital signs and the nursing notes.  Pertinent labs & imaging results that were available during my care of the patient were reviewed by me and considered in my medical decision making (see chart for details).     [name] is a 77 year old male with history of Parkinson's disease, stroke, nonverbal at baseline who presents to the ED with fever, pneumonia.  Patient with fever, tachycardia upon arrival.  Patient with mild increase in respiratory rate.  Patient was diagnosed with pneumonia at facility today and was given a dose of IM Rocephin at noon as well as Tylenol.  Patient was sent for further evaluation given concern for sepsis.  Patient is DNR according to nursing staff.  Patient overall is well-appearing.  Has some diminished breath sounds throughout.  Abdomen is nontender.  Sepsis labs were ordered and patient was given 1 L normal saline bolus and IV Zithromax.  Patient found to have pneumonia on chest x-ray.  Lactic acid normal.  Patient with leukocytosis of 19.  Otherwise labs are overall unremarkable.  Patient to be admitted to medicine for sepsis from pneumonia.  Patient remained hemodynamically stable throughout my care.  Urinalysis pending at time of handoff to medicine.  Final Clinical Impressions(s) / ED Diagnoses   Final diagnoses:  Sepsis, due to unspecified organism [facility](HCC)  Community acquired pneumonia, unspecified laterality    ED Discharge Orders    None       Virgina NorfolkCuratolo, Toshi Ishii, DO 11/04/17 0104    Virgina Norfolkuratolo, Cloy Cozzens,  DO 11/22/17 1116

## 2017-11-03 NOTE — Progress Notes (Signed)
Pharmacy Antibiotic Note  Kristopher Jennings is a 77 y.o. male [facility] with c/o fever.  CXR on 8/15 showed "Left mid and lower lung airspace disease compatible with pneumonia."  To start vancomycin and zosyn for PNA.  - Tmax 102.2, wbc 19.2, scr 1.07 (crcl~44) - weight 52 kg on 08/29/17  Plan: - Zosyn 3.375 gm IV x1 over 30 min, then 3.375 gm IV q8h (infuse over 4 hours) - Vancomycin 1250 mg IV q48h for est AUC 453 - daily scr  ____________________________________     Temp (24hrs), Avg:102.2 F (39 C), Min:102.2 F (39 C), Max:102.2 F (39 C)  Recent Labs  Lab 11/03/17 1818 11/03/17 1827  WBC  --  19.2*  CREATININE  --  1.07  LATICACIDVEN 1.38  --     CrCl cannot be calculated (Unknown ideal weight.).    Allergies  Allergen Reactions  . Demerol [Meperidine] Other (See Comments)    Reaction:  Unknown   . Lactose Intolerance (Gi) Other (See Comments)    Reaction:  Unknown   . Nickel Other (See Comments)    Reaction:  Unknown   . Oxycodone Other (See Comments)    Reaction:  Unknown      Thank you for allowing pharmacy to be a part of this patient's care.  Kristopher Jennings, Kristopher Jennings P 11/03/2017 8:25 PM

## 2017-11-03 NOTE — ED Triage Notes (Signed)
[facility]. Facility noticed around noon he was running a fever 99.8, and his urine was really dark. Dr. At facility got CXR and showed pneumonia to left upper lobe.  Facility gave 650 of tylenol as well as rocephin.  Pt has Parkinsons, and history of strokes; pt hard to verbalize but will follow commands. 18g in right forearm. Has had 500ml of fluid   Pt aspirates easily.

## 2017-11-03 NOTE — ED Notes (Signed)
Bed: [id number] Expected date:  Expected time:  Means of arrival:  Comments: Fever

## 2017-11-04 LAB — HIV ANTIBODY (ROUTINE TESTING W REFLEX): HIV SCREEN 4TH GENERATION: NONREACTIVE

## 2017-11-04 LAB — CREATININE, SERUM: CREATININE: 0.87 mg/dL (ref 0.61–1.24)

## 2017-11-04 LAB — LACTIC ACID, PLASMA: LACTIC ACID, VENOUS: 1.5 mmol/L (ref 0.5–1.9)

## 2017-11-04 LAB — URINALYSIS, ROUTINE W REFLEX MICROSCOPIC
Bilirubin Urine: NEGATIVE
Glucose, UA: NEGATIVE mg/dL
KETONES UR: 5 mg/dL — AB
Nitrite: NEGATIVE
PH: 7 (ref 5.0–8.0)
Protein, ur: 30 mg/dL — AB
SPECIFIC GRAVITY, URINE: 1.014 (ref 1.005–1.030)
WBC, UA: >50 WBC/hpf — ABNORMAL HIGH (ref 0–5)

## 2017-11-04 LAB — PROTIME-INR
INR: 1.31
PROTHROMBIN TIME: 16.2 s — AB (ref 11.4–15.2)

## 2017-11-04 LAB — MRSA PCR SCREENING: MRSA by PCR: POSITIVE — AB

## 2017-11-04 LAB — STREP PNEUMONIAE URINARY ANTIGEN: STREP PNEUMO URINARY ANTIGEN: NEGATIVE

## 2017-11-04 MED ORDER — CHLORHEXIDINE GLUCONATE CLOTH 2 % EX PADS
6.0000 | MEDICATED_PAD | Freq: Every day | CUTANEOUS | Status: AC
  Administered 2017-11-04 – 2017-11-08 (×5): 6 via TOPICAL

## 2017-11-04 MED ORDER — MUPIROCIN 2 % EX OINT
1.0000 "application " | TOPICAL_OINTMENT | Freq: Two times a day (BID) | CUTANEOUS | Status: AC
  Administered 2017-11-04 – 2017-11-08 (×10): 1 via NASAL
  Filled 2017-11-04: qty 22

## 2017-11-04 MED ORDER — SODIUM CHLORIDE 0.45 % IV SOLN
INTRAVENOUS | Status: DC
  Administered 2017-11-04 – 2017-11-09 (×8): via INTRAVENOUS

## 2017-11-04 MED ORDER — ALBUTEROL SULFATE (2.5 MG/3ML) 0.083% IN NEBU
2.5000 mg | INHALATION_SOLUTION | Freq: Four times a day (QID) | RESPIRATORY_TRACT | Status: DC
  Administered 2017-11-04: 2.5 mg via RESPIRATORY_TRACT
  Filled 2017-11-04: qty 3

## 2017-11-04 MED ORDER — ALBUTEROL SULFATE (2.5 MG/3ML) 0.083% IN NEBU
2.5000 mg | INHALATION_SOLUTION | Freq: Three times a day (TID) | RESPIRATORY_TRACT | Status: DC
  Administered 2017-11-04 – 2017-11-08 (×12): 2.5 mg via RESPIRATORY_TRACT
  Filled 2017-11-04 (×12): qty 3

## 2017-11-04 NOTE — Progress Notes (Signed)
[facility] care [facility]. Pt known to CSW from previous encounter. Spoke with facility and with pt's son. Plan for return at DC- will follow to facilitate. See complete assessment from previous encounter. No changes in psychosocial situation noted.  Ilean SkillMeghan Treniya Lobb, MSW, LCSW Clinical Social Work 11/04/2017 [phone]    Clinical Social Work Assessment  Patient Details  Name: Kristopher Jennings MRN: [id number] Date of Birth: 20-Mar-1941  Date of referral:  08/30/17               Reason for consult:  (admitted from facility)                       Permission sought to share information with:  Family Supports Permission granted to share information::                Name::     son Annett GulaRuss Sian             Agency[facility]             Relationship::                Contact Information:     Housing/Transportation Living arrangements for the past 2 months:  Skilled Building surveyorursing Facility Source of Information:  Adult Children, Facility Patient Interpreter Needed:  None Criminal Activity/Legal Involvement Pertinent to Current Situation/Hospitalization:  No - Comment as needed Significant Relationships:  Adult Children, Merchandiser, retailCommunity Support Lives with:  Facility Resident Do you feel safe going back to the place where you live?  Yes Need for family participation in patient care:  Yes (Comment)(pt with dementia)  Care giving concerns:  [facility] has [facility] there [facility]. Has dementia but at baseline knows caretakers, surroundings, and situation per son. Has history of Parkinson's and stroke. Is immobile at baseline- son states staff transfers him to a chair for a few hours each day. Requires staff to feed and bathe him Currently admitted for pneumonia.   Social Worker assessment / plan:  CSW consulted to assist with disposition as pt is [facility] Is long term care resident there. Pt unable to  speak and participate at this time- spoke with son via phone and gathered care needs above. Plan to return to SNF when stable for DC. [facility]. Will provide updated FL2.  Employment status:  Retired Health and safety inspectornsurance information:  Medicare PT Recommendations:  Not assessed at this time(pt immobile at baseline) Information / Referral to community resources:     Patient/Family's Response to care:  Son appreciative  Patient/Family's Understanding of and Emotional Response to Diagnosis, Current Treatment, and Prognosis:  Unable to assess pt's understanding. Son shows good understanding of treatment, describing to CSW and was good historian of care needs. Emotionally appropriate  Emotional Assessment Appearance:  Appears stated age Attitude/Demeanor/Rapport:  (UTA- sleeping) Affect (typically observed):    Orientation:  (not oriented) Alcohol / Substance use:  Not Applicable Psych involvement (Current and /or in the community):  No (Comment)  Discharge Needs  Concerns to be addressed:  Care Coordination Readmission within the last 30 days:  No Current discharge risk:  (assessing) Barriers to Discharge:  Continued Medical Work up   Terex CorporationMeghan R Sheika Coutts, LCSW 08/30/2017, 9:34 AM  [phone]

## 2017-11-04 NOTE — Progress Notes (Signed)
Initial Nutrition Assessment  DOCUMENTATION CODES:   Underweight, Severe malnutrition in context of chronic illness  INTERVENTION:   - Once diet advanced, Ensure Enlive po TID, each supplement provides 350 kcal and 20 grams of protein  NUTRITION DIAGNOSIS:   Severe Malnutrition related to chronic illness (Parkinson's disease, dementia, dysphagia) as evidenced by moderate fat depletion, severe fat depletion, moderate muscle depletion, severe muscle depletion.  GOAL:   Patient will meet greater than or equal to 90% of their needs  MONITOR:   Diet advancement, Labs, I & O's, Weight trends  REASON FOR ASSESSMENT:   Malnutrition Screening Tool    ASSESSMENT:   77 year old male who [facility]. PMH significant for Parkinson's disease, dementia, dysphagia, recurrent aspiration pneumonia, and stroke and is nonverbal at baseline. Per H&P, pt cannot walk due to rigidity. Pt meets criteria for sepsis.  Noted SLP evaluation pending. Spoke with RN who reports that she noticed pt with yellow material on chin, neck, and shoulder when she arrived this morning that may have been a result of emesis. RN suctioned pt.  Pt resting quietly in bed at time of visit. Pt nodded head "yes" when RD asked permission to complete nutrition-focused physical exam. Pt opened mouth slightly upon prompting for micronutrient assessment portion of nutrition-focused physical exam.  Since pt is nonverbal at baseline, unable to obtain further information. RD asked other "yes" or "no" questions, but pt appears somnolent and was staring at the TV.  Per weight history in chart, pt's weight has been stable between 115-116 lbs since May 2018.  Medications reviewed and include: 75 mcg levothyroxine daily, IV antibiotics  Labs reviewed: sodium 148 (H), BUN 33 (H)  NUTRITION - FOCUSED PHYSICAL EXAM:    Most Recent Value  Orbital Region  Severe depletion  Upper Arm Region  Moderate  depletion  Thoracic and Lumbar Region  Severe depletion  Buccal Region  Severe depletion  Temple Region  Severe depletion  Clavicle Bone Region  Severe depletion  Clavicle and Acromion Bone Region  Severe depletion  Scapular Bone Region  Unable to assess  Dorsal Hand  Moderate depletion  Patellar Region  Severe depletion  Anterior Thigh Region  Severe depletion  Posterior Calf Region  Severe depletion  Edema (RD Assessment)  None  Hair  Reviewed  Eyes  Reviewed  Mouth  Reviewed  Skin  Reviewed  Nails  Reviewed       Diet Order:   Diet Order            Diet NPO time specified  Diet effective now              EDUCATION NEEDS:   Not appropriate for education at this time  Skin:  Skin Assessment: Reviewed RN Assessment  Last BM:  11/04/17 - large type 2  Height:   Ht Readings from Last 1 Encounters:  11/04/17 6' (1.829 m)    Weight:   Wt Readings from Last 1 Encounters:  11/04/17 52.5 kg    Ideal Body Weight:  80.91 kg  BMI:  Body mass index is 15.7 kg/m.  Estimated Nutritional Needs:   Kcal:  1400-1600  Protein:  65-80 grams  Fluid:  1.4-1.6 L    Earma ReadingKate Jablonski Ezelle Surprenant, MS, RD, LDN Pager: [phone] Weekend/After Hours: [phone]

## 2017-11-04 NOTE — Evaluation (Signed)
Clinical/Bedside Swallow Evaluation Patient Details  Name: Kristopher Jennings MRN: [id number] Date of Birth: February 06, 1941  Today's Date: 11/04/2017 Time: SLP Start Time (ACUTE ONLY): 1459 SLP Stop Time (ACUTE ONLY): 1518 SLP Time Calculation (min) (ACUTE ONLY): 19 min  Past Medical History:  Past Medical History:  Diagnosis Date  . Anxiety   . Arthritis   . Ataxia   . Cauda equina syndrome (HCC)   . Depression   . Dysphagia   . Essential Tremors   . GERD (gastroesophageal reflux disease)   . Paralysis agitans (HCC) 01/12/2013  . Parkinson disease (HCC)   . Stroke (HCC)   . Thyroid disease   . UTI (lower urinary tract infection)    Past Surgical History:  Past Surgical History:  Procedure Laterality Date  . IMPLANTATION / PLACEMENT EPIDURAL NEUROSTIMULATOR ELECTRODES Left   . LAMINECTOMY    . SPINE SURGERY     HPI:  77 yo male [facility] with congestion, AMS, found to have left lobe ATX/infiltrate and sepsis.  Pt with h/o Parkinson's disease, cva, severe dysphagia and has been on honey thickened liquids at facility per SLP conversation with son.  Prior MBS 07/2016 indicated chronic aspiration risk due to severity of dysphagia and clears recommended for improved lung absorption.  Pt NPO currently.    Assessment / Plan / Recommendation Clinical Impression  Pt known to this SLP from prior MBS in May 2018.   He likely has worsened dysphagia than last year when moderately severe oropharyngeal deficits diagnosed.  Do not advise repeating MBS as will not change pt's care plan - son in agreement via phone.  Pt continues with weak cough and voice decreasing airway protection.  He clearly desires "water" however and accepted intake from this SLP.  Multiple swallows noted across all boluses with cough that was not productive.  Use of straw appeared to worsen airway protection - likely due to larger bolus spilling into pharynx/larynx with increased amount of aspiration.    SLP phoned son Kristopher RaringRuss  *with pt's permission* and discussed findings with him.  Pt's son stated "I don't think he can tolerate much more of this" and indicated he was ready for dad "to have hospice/palliative care".  He continues to be willing to have pt consume diet with mitigation strategies.    Clear liquid diet reviewed with pt/MD and son and all agreeable to plan.  Will follow up if indicated dependent on goals of care.  Thanks for allowing me to help with this pt's care plan.   SLP Visit Diagnosis: Dysphagia, oropharyngeal phase (R13.12)    Aspiration Risk  Severe aspiration risk;Risk for inadequate nutrition/hydration    Diet Recommendation Other (Comment)(clear liquids for comfort agreed upon per SLP discussion with pt/pt's son Kristopher Jennings and MD)   Liquid Administration via: Cup;No straw;Spoon Medication Administration: Crushed with puree Supervision: Staff to assist with self feeding;Full supervision/cueing for compensatory strategies Compensations: Minimize environmental distractions;Slow rate;Small sips/bites;Follow solids with liquid Postural Changes: Seated upright at 90 degrees;Remain upright for at least 30 minutes after po intake    Other  Recommendations Oral Care Recommendations: (oral care prior to po intake) Other Recommendations: Have oral suction available   Follow up Recommendations        Frequency and Duration min 1 x/week  1 week       Prognosis Prognosis for Safe Diet Advancement: Guarded Barriers to Reach Goals: Time post onset;Severity of deficits      Swallow Study   General Date of Onset: 11/04/17  HPI: 77 [facility] with congestion, AMS, found to have left lobe ATX/infiltrate and sepsis.  Pt with h/o Parkinson's disease, cva, severe dysphagia and has been on honey thickened liquids at facility per SLP conversation with son.  Prior MBS 07/2016 indicated chronic aspiration risk due to severity of dysphagia and clears recommended for improved lung absorption.  Pt NPO  currently.  Type of Study: Bedside Swallow Evaluation Previous Swallow Assessment: see HPI Diet Prior to this Study: NPO Temperature Spikes Noted: Yes Respiratory Status: Nasal cannula History of Recent Intubation: No Behavior/Cognition: Alert Oral Cavity Assessment: Dry Oral Care Completed by SLP: Yes Oral Cavity - Dentition: Adequate natural dentition;Missing dentition(few lower teeth, decayed) Self-Feeding Abilities: Total assist Patient Positioning: Upright in bed Baseline Vocal Quality: Low vocal intensity Volitional Cough: Weak(but pt attempts) Volitional Swallow: Unable to elicit    Oral/Motor/Sensory Function Overall Oral Motor/Sensory Function: Moderate impairment(cog wheel rigidity) Facial ROM: Suspected CN VII (facial) dysfunction Facial Symmetry: Suspected CN VII (facial) dysfunction Facial Strength: Suspected CN VII (facial) dysfunction Facial Sensation: Suspected CN V (Trigeminal) dysfunction Lingual ROM: Suspected CN XII (hypoglossal) dysfunction Lingual Symmetry: Suspected CN XII (hypoglossal) dysfunction Lingual Strength: Suspected CN XII (hypoglossal) dysfunction Lingual Sensation: Suspected CN VII (facial) dysfunction-anterior 2/3 tongue   Ice Chips Ice chips: (single ice chip) Pharyngeal Phase Impairments: Suspected delayed Swallow   Thin Liquid Thin Liquid: Impaired Presentation: Cup;Straw;Spoon Pharyngeal  Phase Impairments: Suspected delayed Swallow;Multiple swallows;Cough - Delayed;Throat Clearing - Immediate Other Comments: multiple swallows    Nectar Thick Nectar Thick Liquid: Not tested Other Comments: d/t pt's last MBS results   Honey Thick Honey Thick Liquid: Not tested   Puree Puree: Impaired(jello) Presentation: Spoon;Self Fed Oral Phase Impairments: Reduced lingual movement/coordination Oral Phase Functional Implications: Prolonged oral transit Pharyngeal Phase Impairments: Suspected delayed Swallow;Multiple swallows   Solid     Solid: Not  tested      Kristopher Jennings 11/04/2017,4:03 PM   Kristopher Burnetamara Karen Huhta, MS [facility] SLP [phone]

## 2017-11-04 NOTE — Progress Notes (Signed)
PROGRESS NOTE    Kristopher Jennings  YHC:[id number] DOB: 1940-12-07 DOA: 11/03/2017 PCP: Seward Carol, MD   Brief Narrative: 77 y.o. male with medical history significant of Parkinson's disease, dysphasia, nonverbal status, essential tremor, BPH, stroke, GERD, hypothyroidism, depression, anxiety, recurrent aspiration pneumonia, who presents with fever.  Pt is nonverbal and cannot walk due to rigidity. Today pt was found to have fever in facility.  They did chest x-ray, which showed pneumonia.  Patient was given 1 dose of IM Rocephin injection and transferred to ED for further evaluation and treatment. Due to nonverbal status, it is difficult to get any history from the patient directly, therefore, most of the history is obtained by discussing the case with ED physician, per EMS report, and with the nursing staff. Pt seems to have mild cough, but no acute respiratory distress.  No active nausea, vomiting, diarrhea notated. When I saw pt in ED, he is alert, he shakes her head when asked if he has any chest pain. He seems to have central abdominal tenderness on physical examination. Per DEP which contacted SNF, pt's mental status is at his baseline.  ED Course: pt was found to have WBC 19.2, lactic acid of 1.38, sodium 148, creatinine 1.07, temperature 102.2, tachycardia, tachypnea, oxygen saturation 95% on 2 L nasal cannula oxygen.  Chest x-ray showed infiltration in LML and LLL.   Assessment & Plan:   Principal Problem:   Sepsis ([place]) Active Problems:   Hypothyroidism   Essential hypertension, benign   BPH (benign prostatic hyperplasia)   Aspiration pneumonia (HCC)   HCAP (healthcare-associated pneumonia)   Dysphagia   Abdominal tenderness  1] healthcare associated pneumonia-patient met criteria for sepsis with fever leukocytosis tachycardia and tachypnea.  Continue vancomycin and Zosyn.  Check MRSA PCR.  There is a concern for aspiration speech therapy eval pending.  If he is a high  risk for aspiration we may have to cover him for aspiration.  Follow blood culture.  Add SVN treatments.  2] Parkinson's disease with dysphagia and await swallow evaluation.  Patient takes Sensipar at home.  3] hypothyroidism on IV Synthroid now since he is n.p.o.  4] hypertension blood pressure stable continue PRN IV hydralazine.  5] history of BPH restart Flomax once he is started p.o. Intake.  6] FEN patient is n.p.o. status due to high aspiration risk.  I will change his fluids to half-normal saline.  His sodium was on the high side yesterday.  Follow-up levels tomorrow.      DVT prophylaxis: Heparin Code Status DO NOT RESUSCITATE Family Communication: No family at bedside Disposition Plan plan is to discharge him back to SNF once stable.  Consultants: None  Procedures: None Antimicrobial Vanco and Zosyn  Subjective: Resting in bed in no acute distress he is nonverbal   Objective: Vitals:   11/04/17 0802 11/04/17 0904 11/04/17 0928 11/04/17 1055  BP:      Pulse:      Resp:      Temp:   (!) 101.8 F (38.8 C) 100 F (37.8 C)  TempSrc:   Oral Oral  SpO2:      Weight: 52.5 kg     Height:  6' (1.829 m)      Intake/Output Summary (Last 24 hours) at 11/04/2017 1226 Last data filed at 11/04/2017 0637 Gross per 24 hour  Intake 647.52 ml  Output 800 ml  Net -152.48 ml   Filed Weights   11/04/17 0802  Weight: 52.5 kg    Examination: Oral mucosa  dry  General exam: Appears calm and comfortable  Respiratory system: Scattered rhonchi to auscultation. Respiratory effort normal. Cardiovascular system: S1 & S2 heard, RRR. No JVD, murmurs, rubs, gallops or clicks. No pedal edema. Gastrointestinal system: Abdomen is nondistended, soft and nontender. No organomegaly or masses felt. Normal bowel sounds heard. Extremities: Symmetric 5 x 5 power. Skin: No rashes, lesions or ulcers     Data Reviewed: I have personally reviewed following labs and imaging  studies  CBC: Recent Labs  Lab 11/03/17 1827  WBC 19.2*  NEUTROABS 16.0*  HGB 13.7  HCT 41.0  MCV 94.0  PLT 201   Basic Metabolic Panel: Recent Labs  Lab 11/03/17 1827 11/04/17 0117  NA 148*  --   K 3.9  --   CL 111  --   CO2 22  --   GLUCOSE 129*  --   BUN 33*  --   CREATININE 1.07 0.87  CALCIUM 9.4  --    GFR: Estimated Creatinine Clearance: 54.5 mL/min (by C-G formula based on SCr of 0.87 mg/dL). Liver Function Tests: Recent Labs  Lab 11/03/17 1827  AST 15  ALT <5  ALKPHOS 85  BILITOT 1.0  PROT 7.8  ALBUMIN 3.3*   Recent Labs  Lab 11/03/17 2159  LIPASE 24   No results for input(s): AMMONIA in the last 168 hours. Coagulation Profile: Recent Labs  Lab 11/04/17 0117  INR 1.31   Cardiac Enzymes: No results for input(s): CKTOTAL, CKMB, CKMBINDEX, TROPONINI in the last 168 hours. BNP (last 3 results) No results for input(s): PROBNP in the last 8760 hours. HbA1C: No results for input(s): HGBA1C in the last 72 hours. CBG: No results for input(s): GLUCAP in the last 168 hours. Lipid Profile: No results for input(s): CHOL, HDL, LDLCALC, TRIG, CHOLHDL, LDLDIRECT in the last 72 hours. Thyroid Function Tests: No results for input(s): TSH, T4TOTAL, FREET4, T3FREE, THYROIDAB in the last 72 hours. Anemia Panel: No results for input(s): VITAMINB12, FOLATE, FERRITIN, TIBC, IRON, RETICCTPCT in the last 72 hours. Sepsis Labs: Recent Labs  Lab 11/03/17 1818 11/03/17 2159 11/04/17 0117  PROCALCITON  --  1.25  --   LATICACIDVEN 1.38 2.3* 1.5    Recent Results (from the past 240 hour(s))  Blood Culture (routine x 2)     Status: None (Preliminary result)   Collection Time: 11/03/17  5:30 PM  Result Value Ref Range Status   Specimen Description   Final    BLOOD BLOOD LEFT FOREARM Performed at [facility] [place] [address], [place], [place] [address]    Special Requests   Final    BOTTLES DRAWN AEROBIC AND ANAEROBIC Blood Culture results  may not be optimal due to an inadequate volume of blood received in culture bottles Performed at [facility] [address] [place], [place] [address]    Culture   Final    NO GROWTH < 24 HOURS Performed at [facility] [address], [place], [place] [address]    Report Status PENDING  Incomplete  Blood Culture (routine x 2)     Status: None (Preliminary result)   Collection Time: 11/03/17  6:12 PM  Result Value Ref Range Status   Specimen Description   Final    BLOOD BLOOD RIGHT FOREARM Performed at [facility] [address], [place], [place] [address]    Special Requests   Final    BOTTLES DRAWN AEROBIC AND ANAEROBIC Blood Culture adequate volume Performed at [facility]  [place] [address] [place], [place] [address]    Culture   Final    NO GROWTH < 24 HOURS Performed at [facility] [address] [place], [place] [address]    Report Status PENDING  Incomplete  MRSA PCR Screening     Status: Abnormal   Collection Time: 11/04/17  9:53 AM  Result Value Ref Range Status   MRSA by PCR POSITIVE (A) NEGATIVE Final    Comment:        The GeneXpert MRSA Assay (FDA approved for NASAL specimens only), is one component of a comprehensive MRSA colonization surveillance program. It is not intended to diagnose MRSA infection nor to guide or monitor treatment for MRSA infections. RESULT CALLED TO, READ BACK BY AND VERIFIED WITH: Cyndia Diver [id number] @ [facility] Performed at [facility] [address], [place], [place] [address]          Radiology Studies: Ct Abdomen Pelvis Wo Contrast  Result Date: 11/03/2017 CLINICAL DATA:  Abdominal pain, fever EXAM: CT ABDOMEN AND PELVIS WITHOUT CONTRAST TECHNIQUE: Multidetector CT imaging of the abdomen and pelvis was performed following the standard protocol without IV contrast. COMPARISON:  Chest x-ray earlier today FINDINGS:  Lower chest: Consolidation noted in the left lower lobe and to a lesser extent the right lower lobe compatible with pneumonia. Heart is normal size. No effusions. Hepatobiliary: No focal hepatic abnormality. Gallbladder unremarkable. Pancreas: No focal abnormality or ductal dilatation. Spleen: No focal abnormality.  Normal size. Adrenals/Urinary Tract: Numerous bilateral renal stones with mild hydronephrosis bilaterally. Multiple stones layering within the dilated renal pelves bilaterally. Largest stone on the left is in the midpole measuring 10 mm. Largest stone on the right is layering in the renal pelvis measuring 11 mm. There are several layering stones within the urinary bladder is well. No visible urinary at no visible ureteral stones. Adrenal glands unremarkable. Stomach/Bowel: Large stool burden in the rectosigmoid colon concerning for fecal impaction. Moderate stool burden throughout the remainder of the colon. Stomach and small bowel decompressed, unremarkable. No diverticulosis or diverticulitis. Vascular/Lymphatic: Aortic atherosclerosis. No enlarged abdominal or pelvic lymph nodes. Reproductive: No visible focal abnormality. Other: No free fluid or free air. Musculoskeletal: No acute bony abnormality. IMPRESSION: Dense consolidation in the left lower lobe with early infiltrate at the right lung base. Findings compatible with pneumonia. Numerous bilateral renal stones. Mild hydronephrosis bilaterally without visible ureteral stones. There are layering stones within the renal pelves bilaterally. Multiple small dependent stones in the bladder. Large stool burden in the colon, particularly rectosigmoid colon concerning for fecal impaction. Aortoiliac atherosclerosis. Electronically Signed   By: Rolm Baptise M.D.   On: 11/03/2017 20:49   Dg Chest Port 1 View  Result Date: 11/03/2017 CLINICAL DATA:  Fever EXAM: PORTABLE CHEST 1 VIEW COMPARISON:  08/29/2017 FINDINGS: Vagal stimulator noted on the left,  unchanged. Heart is normal size. Airspace disease noted in the left mid and lower lung compatible with pneumonia. Right lung clear. No effusions or acute bony abnormality. IMPRESSION: Left mid and lower lung airspace disease compatible with pneumonia. Electronically Signed   By: Rolm Baptise M.D.   On: 11/03/2017 17:48        Scheduled Meds: . Chlorhexidine Gluconate Cloth  6 each Topical Q0600  . enoxaparin (LOVENOX) injection  40 mg Subcutaneous Q24H  . levothyroxine  75 mcg Intravenous Daily  . mupirocin ointment  1 application Nasal BID   Continuous Infusions: . sodium chloride  125 mL/hr at 11/04/17 1051  . piperacillin-tazobactam (ZOSYN)  IV 3.375 g (11/04/17 1051)  . vancomycin 1,250 mg (11/04/17 0000)     LOS: 1 day     Georgette Shell, MD [facility]  If 7PM-7AM, please contact night-coverage [email or web address] Password [facility] 11/04/2017, 12:26 PM

## 2017-11-05 DIAGNOSIS — Z515 Encounter for palliative care: Secondary | ICD-10-CM

## 2017-11-05 DIAGNOSIS — Z7189 Other specified counseling: Secondary | ICD-10-CM

## 2017-11-05 LAB — BLOOD CULTURE ID PANEL (REFLEXED)
Acinetobacter baumannii: NOT DETECTED
CANDIDA GLABRATA: NOT DETECTED
CANDIDA KRUSEI: NOT DETECTED
CANDIDA PARAPSILOSIS: NOT DETECTED
CANDIDA TROPICALIS: NOT DETECTED
Candida albicans: NOT DETECTED
ENTEROBACTER CLOACAE COMPLEX: NOT DETECTED
ESCHERICHIA COLI: NOT DETECTED
Enterobacteriaceae species: NOT DETECTED
Enterococcus species: NOT DETECTED
Haemophilus influenzae: NOT DETECTED
KLEBSIELLA OXYTOCA: NOT DETECTED
KLEBSIELLA PNEUMONIAE: NOT DETECTED
Listeria monocytogenes: NOT DETECTED
Methicillin resistance: DETECTED — AB
NEISSERIA MENINGITIDIS: NOT DETECTED
Proteus species: NOT DETECTED
Pseudomonas aeruginosa: NOT DETECTED
STREPTOCOCCUS PYOGENES: NOT DETECTED
STREPTOCOCCUS SPECIES: NOT DETECTED
Serratia marcescens: NOT DETECTED
Staphylococcus aureus (BCID): NOT DETECTED
Staphylococcus species: DETECTED — AB
Streptococcus agalactiae: NOT DETECTED
Streptococcus pneumoniae: NOT DETECTED

## 2017-11-05 LAB — BASIC METABOLIC PANEL
Anion gap: 8 (ref 5–15)
BUN: 23 mg/dL (ref 8–23)
CHLORIDE: 114 mmol/L — AB (ref 98–111)
CO2: 23 mmol/L (ref 22–32)
CREATININE: 0.77 mg/dL (ref 0.61–1.24)
Calcium: 8.8 mg/dL — ABNORMAL LOW (ref 8.9–10.3)
GFR calc non Af Amer: >60 mL/min (ref >60)
Glucose, Bld: 109 mg/dL — ABNORMAL HIGH (ref 70–99)
POTASSIUM: 2.7 mmol/L — AB (ref 3.5–5.1)
SODIUM: 145 mmol/L (ref 135–145)

## 2017-11-05 LAB — URINE CULTURE: CULTURE: NO GROWTH

## 2017-11-05 LAB — CBC
HEMATOCRIT: 34.2 % — AB (ref 39.0–52.0)
Hemoglobin: 11.1 g/dL — ABNORMAL LOW (ref 13.0–17.0)
MCH: 30.8 pg (ref 26.0–34.0)
MCHC: 32.5 g/dL (ref 30.0–36.0)
MCV: 95 fL (ref 78.0–100.0)
PLATELETS: 202 10*3/uL (ref 150–400)
RBC: 3.6 MIL/uL — AB (ref 4.22–5.81)
RDW: 14.3 % (ref 11.5–15.5)
WBC: 14 10*3/uL — AB (ref 4.0–10.5)

## 2017-11-05 MED ORDER — POTASSIUM CHLORIDE 10 MEQ/100ML IV SOLN
10.0000 meq | INTRAVENOUS | Status: AC
  Administered 2017-11-05 (×6): 10 meq via INTRAVENOUS
  Filled 2017-11-05 (×6): qty 100

## 2017-11-05 MED ORDER — POTASSIUM CHLORIDE 10 MEQ/100ML IV SOLN: 10.0000 meq | INTRAVENOUS | Status: DC

## 2017-11-05 NOTE — Consult Note (Signed)
Consultation Note Date: 11/05/2017   Patient Name: Kristopher Jennings  DOB: 1940-05-28  MRN: [id number]  Age / Sex: 77 y.o., male  PCP: Renford DillsPolite, Ronald, MD Referring Physician: Alwyn RenMathews, Elizabeth G, MD  Reason for Consultation: Establishing goals of care  HPI/Patient Profile: 77 y.o. male  admitted on 11/03/2017   Clinical Assessment and Goals of Care:  77 year old gentleman, resident of [facility], history of stroke, Parkinson's disease, recurrent aspiration pneumonia, essentially bed bound at his nursing facility due to rigidity, generalized weakness, admitted to the hospital from skilled nursing facility to hospital medicine service with fever, sepsis likely from recurrent aspiration.   The patient was seen and evaluated by palliative services in the past, with most recent eval being in June of this year  A palliative consultation has been requested in this hospitalization for ongoing goals of care discussions.  Patient is an elderly gentleman resting in bed. He does not appear to be in any acute distress.  He does not possess insight to participate in GOC conversation.  I called and was able to speak with his son, Kristopher Jennings.  We discussed clinical course and continued decline he has had in cognition, functional status, and nutrition as well as multiple recent hospitalizations and recurrent aspiration pneumonia.  His son reports that he feels that his father has continued to progress both in his disease process and into his acceptance of the fact that this is not going to get better.  Gust options moving forward with the understanding that aspiration is going to recur including continuation of antibiotics with hope to get back to [facility] with the additional support of hospice versus transitioning to comfort care and discontinuing current antibiotics understanding that he is going to continue to aspirate with  likely short prognosis.  If this pathway is chosen, I recommended consideration for transition to residential hospice for end-of-life care.  His son reports that he believes his father is at a point where he would prefer to transition to a focus only on comfort.  His father's had continued decreases in his ability to interact with people at [facility] and has always been a very social person.  Kristopher Jennings feels that his father would now find his current quality of life unacceptable and believes that he would likely want to transition to residential hospice with a focus on comfort.  Kristopher Jennings would like to meet with his father tomorrow morning to have conversation with him regarding this.  We have plan for a follow-up meeting for me to join them around 815 tomorrow morning.  HCPOA  son Kristopher Jennings [phone].   SUMMARY OF RECOMMENDATIONS   1. Continue antibiotics and current course overnight. 2.  Son/HC POA believes that his father is likely at a point where if he understood his situation his goal be to focus on comfort and dignity with plan for transition to residential hospice for end-of-life care.  He is coming to the hospital tomorrow morning to discuss this with patient.  I will plan on meeting  up with them tomorrow at 815 to continue conversation.  Code Status/Advance Care Planning:  DNR  Palliative Prophylaxis:  Bowel Regimen   Psycho-social/Spiritual:   Desire for further Chaplaincy support:yes  Additional Recommendations: Caregiving  Support/Resources  Prognosis:   To be determined.  He is going to continue to aspirate regardless of interventions moving forward.   I believe he has an expected prognosis of less than 6 months and should qualify for services at long-term care facility if so desired.  If decision is made for transition to full comfort care with discontinuation of current antibiotics, I believe that he will continue to progress quickly with an expected prognosis of less  than 2 weeks.  At this point, he is not taking much nutrition or hydration.  There is a good likelihood that we are in the last weeks of life regardless of interventions if this continues to be the case.  In this situation, I think you would best be served by transition to residential hospice for end-of-life care.  Discharge Planning: Skilled Nursing Facility for rehab with Palliative care service follow-up      Primary Diagnoses: Present on Admission: . Aspiration pneumonia (HCC) . BPH (benign prostatic hyperplasia) . Essential hypertension, benign . HCAP (healthcare-associated pneumonia) . Hypothyroidism . Sepsis (HCC) . Abdominal tenderness   I have reviewed the medical record, interviewed the patient and family, and examined the patient. The following aspects are pertinent.  Past Medical History:  Diagnosis Date  . Anxiety   . Arthritis   . Ataxia   . Cauda equina syndrome (HCC)   . Depression   . Dysphagia   . Essential Tremors   . GERD (gastroesophageal reflux disease)   . Paralysis agitans (HCC) 01/12/2013  . Parkinson disease (HCC)   . Stroke (HCC)   . Thyroid disease   . UTI (lower urinary tract infection)    Social History   Socioeconomic History  . Marital status: Divorced    Spouse name: Not on file  . Number of children: Not on file  . Years of education: Not on file  . Highest education level: Not on file  Occupational History  . Not on file  Social Needs  . Financial resource strain: Not on file  . Food insecurity:    Worry: Not on file    Inability: Not on file  . Transportation needs:    Medical: Not on file    Non-medical: Not on file  Tobacco Use  . Smoking status: Former Games developermoker  . Smokeless tobacco: Never Used  Substance and Sexual Activity  . Alcohol use: No  . Drug use: No  . Sexual activity: Never  Lifestyle  . Physical activity:    Days per week: Not on file    Minutes per session: Not on file  . Stress: Not on file    Relationships  . Social connections:    Talks on phone: Not on file    Gets together: Not on file    Attends religious service: Not on file    Active member of club or organization: Not on file    Attends meetings of clubs or organizations: Not on file    Relationship status: Not on file  Other Topics Concern  . Not on file  Social History Narrative  . Not on file   History reviewed. No pertinent family history. Scheduled Meds: . albuterol  2.5 mg Nebulization TID  . Chlorhexidine Gluconate Cloth  6 each Topical Q0600  . enoxaparin (  LOVENOX) injection  40 mg Subcutaneous Q24H  . levothyroxine  75 mcg Intravenous Daily  . mupirocin ointment  1 application Nasal BID   Continuous Infusions: . sodium chloride 75 mL/hr at 11/05/17 1137  . piperacillin-tazobactam (ZOSYN)  IV 3.375 g (11/05/17 1144)  . vancomycin 1,250 mg (11/04/17 0000)   PRN Meds:.acetaminophen **OR** acetaminophen, hydrALAZINE, levalbuterol, morphine injection, ondansetron **OR** ondansetron (ZOFRAN) IV Medications Prior to Admission:  Prior to Admission medications   Medication Sig Start Date End Date Taking? Authorizing Provider  acetaminophen (TYLENOL) 325 MG tablet Take 650 mg by mouth every 4 (four) hours as needed for mild pain, moderate pain, fever or headache.    Yes [provider]  acetaminophen (TYLENOL) 650 MG suppository Place 650 mg rectally every 4 (four) hours as needed for fever.   Yes [provider]  buPROPion (WELLBUTRIN SR) 200 MG 12 hr tablet Take 200 mg by mouth daily before breakfast.    Yes [provider]  carbidopa-levodopa (SINEMET IR) 25-100 MG tablet Take 2 tablets by mouth 3 (three) times daily.   Yes [provider]  Cholecalciferol (VITAMIN D) 2000 units CAPS Take 2,000 Units by mouth daily.    Yes [provider]  clonazePAM (KLONOPIN) 0.5 MG tablet Take 1 tablet (0.5 mg total) by mouth daily. 07/24/16  Yes Mikhail, Nita Sells, DO   Dextromethorphan-Quinidine (NUEDEXTA) 20-10 MG CAPS Take 1 capsule by mouth 2 (two) times daily.   Yes [provider]  DIETARY MANAGEMENT PRODUCT PO Take 1 Container by mouth 3 (three) times daily. Magic Cup   Yes [provider]  levothyroxine (SYNTHROID, LEVOTHROID) 150 MCG tablet Take 150 mcg by mouth daily before breakfast.   Yes [provider]  Melatonin 3 MG TABS Take 3 mg by mouth at bedtime.   Yes [provider]  senna (SENOKOT) 8.6 MG TABS tablet Take 1 tablet by mouth at bedtime.   Yes [provider]  tamsulosin (FLOMAX) 0.4 MG CAPS capsule Take 0.8 mg by mouth at bedtime.    Yes [provider]   Allergies  Allergen Reactions  . Demerol [Meperidine] Other (See Comments)    Reaction:  Unknown   . Lactose Intolerance (Gi) Other (See Comments)    Reaction:  Unknown   . Nickel Other (See Comments)    Reaction:  Unknown   . Oxycodone Other (See Comments)    Reaction:  Unknown    Review of Systems +cough +weakness  Physical Exam Frail thin gentleman Resting in bed Few coarse scattered breath sounds, sounds congested when attempts to clear throat S1 S2 Abdomen soft No edema Awake, answers questions appropriately  Vital Signs: BP 129/71 (BP Location: Right Arm)   Pulse 70   Temp 98.6 F (37 C) (Oral)   Resp 16   Ht 6' (1.829 m)   Wt 52.5 kg   SpO2 95%   BMI 15.70 kg/m  Pain Scale: Faces   Pain Score: 0-No pain   SpO2: SpO2: 95 % O2 Device:SpO2: 95 % O2 Flow Rate: .O2 Flow Rate (L/min): 2 L/min  IO: Intake/output summary:   Intake/Output Summary (Last 24 hours) at 11/05/2017 1630 Last data filed at 11/05/2017 1500 Gross per 24 hour  Intake 1974.1 ml  Output 750 ml  Net 1224.1 ml   PPS 40%  LBM: Last BM Date: 11/04/17 Baseline Weight: Weight: 52.5 kg Most recent weight: Weight: 52.5 kg     Palliative Assessment/Data:   Flowsheet Rows     Most  Recent Value  Intake Tab  Referral Department   Hospitalist  Unit at Time of Referral  Med/Surg Unit  Palliative Care Primary Diagnosis  Sepsis/Infectious Disease  Date Notified  11/04/17  Palliative Care Type  Return patient Palliative Care  Reason for referral  Clarify Goals of Care  Date of Admission  11/03/17  Date first seen by Palliative Care  11/05/17  # of days Palliative referral response time  1 Day(s)  # of days IP prior to Palliative referral  1  Clinical Assessment  Palliative Performance Scale Score  30%  Psychosocial & Spiritual Assessment  Palliative Care Outcomes  Patient/Family meeting held?  Yes  Who was at the meeting?  Son via phone  Palliative Care Outcomes  Counseled regarding hospice, Clarified goals of care      Time In:  0915  Time Out:  1030 Time Total:  75 min  Greater than 50%  of this time was spent counseling and coordinating care related to the above assessment and plan.  Signed by: Romie Minus, MD  Please contact Palliative Medicine Team phone at [phone] for questions and concerns.  For individual provider: See Loretha Stapler

## 2017-11-05 NOTE — Progress Notes (Signed)
PHARMACY - PHYSICIAN COMMUNICATION CRITICAL VALUE ALERT - BLOOD CULTURE IDENTIFICATION (BCID)  Kristopher MerinoRussell Jennings is an 77 y.o. male who [facility] with a chief complaint of PNA on CXray.  Hx prev aspiration PNA  Assessment: Coag Neg Staph in 2/2 blood cx with methicillin resistance  Name of physician (or Provider) Contacted: Jerelene ReddenElizabeth Mathews  Current antibiotics: Vancomycin & Zosyn  Changes to prescribed antibiotics recommended:  Patient is on recommended antibiotics - No changes needed  Results for orders placed or performed during the hospital encounter of 11/03/17  Blood Culture ID Panel (Reflexed) (Collected: 11/03/2017  6:12 PM)  Result Value Ref Range   Enterococcus species NOT DETECTED NOT DETECTED   Listeria monocytogenes NOT DETECTED NOT DETECTED   Staphylococcus species DETECTED (A) NOT DETECTED   Staphylococcus aureus NOT DETECTED NOT DETECTED   Methicillin resistance DETECTED (A) NOT DETECTED   Streptococcus species NOT DETECTED NOT DETECTED   Streptococcus agalactiae NOT DETECTED NOT DETECTED   Streptococcus pneumoniae NOT DETECTED NOT DETECTED   Streptococcus pyogenes NOT DETECTED NOT DETECTED   Acinetobacter baumannii NOT DETECTED NOT DETECTED   Enterobacteriaceae species NOT DETECTED NOT DETECTED   Enterobacter cloacae complex NOT DETECTED NOT DETECTED   Escherichia coli NOT DETECTED NOT DETECTED   Klebsiella oxytoca NOT DETECTED NOT DETECTED   Klebsiella pneumoniae NOT DETECTED NOT DETECTED   Proteus species NOT DETECTED NOT DETECTED   Serratia marcescens NOT DETECTED NOT DETECTED   Haemophilus influenzae NOT DETECTED NOT DETECTED   Neisseria meningitidis NOT DETECTED NOT DETECTED   Pseudomonas aeruginosa NOT DETECTED NOT DETECTED   Candida albicans NOT DETECTED NOT DETECTED   Candida glabrata NOT DETECTED NOT DETECTED   Candida krusei NOT DETECTED NOT DETECTED   Candida parapsilosis NOT DETECTED NOT DETECTED   Candida tropicalis  NOT DETECTED NOT DETECTED    Otho BellowsGreen, Sweta Halseth L 11/05/2017  12:40 PM

## 2017-11-05 NOTE — Progress Notes (Signed)
PROGRESS NOTE    Kristopher Jennings  ZWC:[id number] DOB: 1940/10/29 DOA: 11/03/2017 PCP: Seward Carol, MD  Brief Narrative:77 y.o.malewith medical history significant ofParkinson's disease, dysphasia,nonverbal status,essential tremor, BPH, stroke, GERD, hypothyroidism, depression, anxiety,recurrentaspiration pneumonia, who presents with fever.  Pt is nonverbal and cannot walk due to rigidity. Today pt was found to have fever in facility.They did chest x-ray, which showed pneumonia. Patient was given 1 dose of IM Rocephin injection andtransferred to ED for further evaluation and treatment.Due tononverbal status, it is difficult to get any history from the patient directly,therefore, most of the history is obtained by discussing the case with ED physician, per EMS report, and with the nursing staff. Pt seems to have mild cough, but no acuterespiratory distress. No active nausea, vomiting, diarrhea notated. When I saw pt in ED, he is alert, he shakes her head when asked if he has any chest pain. He seems to havecentral abdominal tenderness on physical examination. Per DEP which contacted SNF, pt'smental status is at his baseline.  ED Course:pt was found to haveWBC 19.2, lactic acid of 1.38, sodium 148, creatinine 1.07, temperature 102.2, tachycardia, tachypnea, oxygen saturation 95% on 2 L nasal cannula oxygen. Chest x-ray showed infiltration in LML and LLL.  Assessment & Plan:   Principal Problem:   Sepsis ([place]) Active Problems:   Hypothyroidism   Essential hypertension, benign   BPH (benign prostatic hyperplasia)   Aspiration pneumonia (HCC)   HCAP (healthcare-associated pneumonia)   Dysphagia   Abdominal tenderness 1] healthcare associated pneumonia vs aspiration pneumonia-patient met criteria for sepsis with fever leukocytosis tachycardia and tachypnea.  Continue vancomycin and Zosyn.  Reviewed therapy notes patient with severe oropharyngeal dysphagia.  Family  requesting palliative care input Dr. Domingo Cocking has been consulted.  2] Parkinson's disease with dysphagia and await swallow evaluation.  Patient takes Sensipar at home.  3] hypothyroidism on IV Synthroid now since he is n.p.o.  4] hypertension blood pressure stable continue PRN IV hydralazine.  5] history of BPH Flomax on hold  6] FEN patient is n.p.o. status due to high aspiration risk.  I will change his fluids to half-normal saline.  His sodium was on the high side yesterday.  Follow-up levels tomorrow.  7] hypokalemia is being repleted.      DVT prophylaxis heparin  Code Status: DO NOT RESUSCITATE Family Communication none Disposition Plan. ?  Hopefully we can discharge him to residential hospice soon.   Consultants: Palliative care   Procedures none Antimicrobials Vanco and cefepime  Subjective: Awake watching TV in no acute distress  Objective: Vitals:   11/04/17 1821 11/04/17 2107 11/05/17 0644 11/05/17 0813  BP:   121/81   Pulse:   (!) 51   Resp:   16   Temp: 100.3 F (37.9 C)  (!) 97.4 F (36.3 C)   TempSrc: Oral  Oral   SpO2:  97% 99% 98%  Weight:      Height:        Intake/Output Summary (Last 24 hours) at 11/05/2017 1155 Last data filed at 11/05/2017 0650 Gross per 24 hour  Intake 1130.59 ml  Output 750 ml  Net 380.59 ml   Filed Weights   11/04/17 0802  Weight: 52.5 kg    Examination:  General exam: Appears calm and comfortable  Respiratory system: Few scattered rhonchi otherwise clear auscultation. Respiratory effort normal. Cardiovascular system: S1 & S2 heard, RRR. No JVD, murmurs, rubs, gallops or clicks. No pedal edema. Gastrointestinal system: Abdomen is nondistended, soft and nontender. No organomegaly or  masses felt. Normal bowel sounds heard. Extremities: Symmetric 5 x 5 power. Skin: No rashes, lesions or ulcers    Data Reviewed: I have personally reviewed following labs and imaging studies  CBC: Recent Labs  Lab  11/03/17 1827 11/05/17 0423  WBC 19.2* 14.0*  NEUTROABS 16.0*  --   HGB 13.7 11.1*  HCT 41.0 34.2*  MCV 94.0 95.0  PLT 226 782   Basic Metabolic Panel: Recent Labs  Lab 11/03/17 1827 11/04/17 0117 11/05/17 0423  NA 148*  --  145  K 3.9  --  2.7*  CL 111  --  114*  CO2 22  --  23  GLUCOSE 129*  --  109*  BUN 33*  --  23  CREATININE 1.07 0.87 0.77  CALCIUM 9.4  --  8.8*   GFR: Estimated Creatinine Clearance: 59.2 mL/min (by C-G formula based on SCr of 0.77 mg/dL). Liver Function Tests: Recent Labs  Lab 11/03/17 1827  AST 15  ALT <5  ALKPHOS 85  BILITOT 1.0  PROT 7.8  ALBUMIN 3.3*   Recent Labs  Lab 11/03/17 2159  LIPASE 24   No results for input(s): AMMONIA in the last 168 hours. Coagulation Profile: Recent Labs  Lab 11/04/17 0117  INR 1.31   Cardiac Enzymes: No results for input(s): CKTOTAL, CKMB, CKMBINDEX, TROPONINI in the last 168 hours. BNP (last 3 results) No results for input(s): PROBNP in the last 8760 hours. HbA1C: No results for input(s): HGBA1C in the last 72 hours. CBG: No results for input(s): GLUCAP in the last 168 hours. Lipid Profile: No results for input(s): CHOL, HDL, LDLCALC, TRIG, CHOLHDL, LDLDIRECT in the last 72 hours. Thyroid Function Tests: No results for input(s): TSH, T4TOTAL, FREET4, T3FREE, THYROIDAB in the last 72 hours. Anemia Panel: No results for input(s): VITAMINB12, FOLATE, FERRITIN, TIBC, IRON, RETICCTPCT in the last 72 hours. Sepsis Labs: Recent Labs  Lab 11/03/17 1818 11/03/17 2159 11/04/17 0117  PROCALCITON  --  1.25  --   LATICACIDVEN 1.38 2.3* 1.5    Recent Results (from the past 240 hour(s))  Blood Culture (routine x 2)     Status: None (Preliminary result)   Collection Time: 11/03/17  5:30 PM  Result Value Ref Range Status   Specimen Description   Final    BLOOD BLOOD LEFT FOREARM Performed at [facility], [place] [address] [place], [place] [address]    Special Requests   Final      BOTTLES DRAWN AEROBIC AND ANAEROBIC Blood Culture results may not be optimal due to an inadequate volume of blood received in culture bottles Performed at [facility] [address] [place], [place] [address]    Culture   Final    NO GROWTH < 24 HOURS Performed at [facility] [address], [place], [place] [address]    Report Status PENDING  Incomplete  Blood Culture (routine x 2)     Status: None (Preliminary result)   Collection Time: 11/03/17  6:12 PM  Result Value Ref Range Status   Specimen Description   Final    BLOOD BLOOD RIGHT FOREARM Performed at [facility] [address] [place], [place] [address]    Special Requests   Final    BOTTLES DRAWN AEROBIC AND ANAEROBIC Blood Culture adequate volume Performed at [facility] [address] [place], [place] [address]    Culture  Setup Time   Final    GRAM POSITIVE COCCI  AEROBIC BOTTLE ONLY Organism ID to follow Performed at [facility], [place] [address], [place], [place] [address]    Culture GRAM POSITIVE COCCI  Final   Report Status PENDING  Incomplete  Urine culture     Status: None   Collection Time: 11/04/17  9:53 AM  Result Value Ref Range Status   Specimen Description   Final    URINE, CATHETERIZED Performed at [facility], [place] [address] [place], [place] [address]    Special Requests   Final    NONE Performed at [facility], [place] [address], [place], [place] [address]    Culture   Final    NO GROWTH Performed at [facility], [place] [address], [place], [place] [address]    Report Status 11/05/2017 FINAL  Final  MRSA PCR Screening     Status: Abnormal   Collection Time: 11/04/17  9:53 AM  Result Value Ref Range Status   MRSA by PCR POSITIVE (A) NEGATIVE Final    Comment:        The GeneXpert MRSA Assay (FDA approved for NASAL specimens only), is one component of a comprehensive  MRSA colonization surveillance program. It is not intended to diagnose MRSA infection nor to guide or monitor treatment for MRSA infections. RESULT CALLED TO, READ BACK BY AND VERIFIED WITH: Cyndia Diver [id number] @ [place] Performed at [facility] [address] [place], [place] [address]          Radiology Studies: Ct Abdomen Pelvis Wo Contrast  Result Date: 11/03/2017 CLINICAL DATA:  Abdominal pain, fever EXAM: CT ABDOMEN AND PELVIS WITHOUT CONTRAST TECHNIQUE: Multidetector CT imaging of the abdomen and pelvis was performed following the standard protocol without IV contrast. COMPARISON:  Chest x-ray earlier today FINDINGS: Lower chest: Consolidation noted in the left lower lobe and to a lesser extent the right lower lobe compatible with pneumonia. Heart is normal size. No effusions. Hepatobiliary: No focal hepatic abnormality. Gallbladder unremarkable. Pancreas: No focal abnormality or ductal dilatation. Spleen: No focal abnormality.  Normal size. Adrenals/Urinary Tract: Numerous bilateral renal stones with mild hydronephrosis bilaterally. Multiple stones layering within the dilated renal pelves bilaterally. Largest stone on the left is in the midpole measuring 10 mm. Largest stone on the right is layering in the renal pelvis measuring 11 mm. There are several layering stones within the urinary bladder is well. No visible urinary at no visible ureteral stones. Adrenal glands unremarkable. Stomach/Bowel: Large stool burden in the rectosigmoid colon concerning for fecal impaction. Moderate stool burden throughout the remainder of the colon. Stomach and small bowel decompressed, unremarkable. No diverticulosis or diverticulitis. Vascular/Lymphatic: Aortic atherosclerosis. No enlarged abdominal or pelvic lymph nodes. Reproductive: No visible focal abnormality. Other: No free fluid or free air. Musculoskeletal: No acute bony abnormality. IMPRESSION: Dense  consolidation in the left lower lobe with early infiltrate at the right lung base. Findings compatible with pneumonia. Numerous bilateral renal stones. Mild hydronephrosis bilaterally without visible ureteral stones. There are layering stones within the renal pelves bilaterally. Multiple small dependent stones in the bladder. Large stool burden in the colon, particularly rectosigmoid colon concerning for fecal impaction. Aortoiliac atherosclerosis. Electronically Signed   By: Rolm Baptise M.D.   On: 11/03/2017 20:49   Dg Chest Port 1 View  Result Date: 11/03/2017 CLINICAL DATA:  Fever EXAM: PORTABLE CHEST 1 VIEW COMPARISON:  08/29/2017 FINDINGS: Vagal stimulator noted on the left, unchanged. Heart is normal size. Airspace disease  noted in the left mid and lower lung compatible with pneumonia. Right lung clear. No effusions or acute bony abnormality. IMPRESSION: Left mid and lower lung airspace disease compatible with pneumonia. Electronically Signed   By: Rolm Baptise M.D.   On: 11/03/2017 17:48        Scheduled Meds: . albuterol  2.5 mg Nebulization TID  . Chlorhexidine Gluconate Cloth  6 each Topical Q0600  . enoxaparin (LOVENOX) injection  40 mg Subcutaneous Q24H  . levothyroxine  75 mcg Intravenous Daily  . mupirocin ointment  1 application Nasal BID   Continuous Infusions: . sodium chloride 75 mL/hr at 11/05/17 1137  . piperacillin-tazobactam (ZOSYN)  IV 3.375 g (11/05/17 1144)  . potassium chloride 10 mEq (11/05/17 1136)  . vancomycin 1,250 mg (11/04/17 0000)     LOS: 2 days     Georgette Shell, MD [facility]  If 7PM-7AM, please contact night-coverage [email or web address] Password [facility] 11/05/2017, 11:55 AM

## 2017-11-05 NOTE — Plan of Care (Addendum)
Pt unable to take in sufficient nourishment PO.

## 2017-11-06 LAB — CBC
HCT: 35.1 % — ABNORMAL LOW (ref 39.0–52.0)
HEMOGLOBIN: 11.4 g/dL — AB (ref 13.0–17.0)
MCH: 30.8 pg (ref 26.0–34.0)
MCHC: 32.5 g/dL (ref 30.0–36.0)
MCV: 94.9 fL (ref 78.0–100.0)
PLATELETS: 200 10*3/uL (ref 150–400)
RBC: 3.7 MIL/uL — AB (ref 4.22–5.81)
RDW: 14 % (ref 11.5–15.5)
WBC: 13.2 10*3/uL — AB (ref 4.0–10.5)

## 2017-11-06 LAB — BASIC METABOLIC PANEL
ANION GAP: 7 (ref 5–15)
BUN: 15 mg/dL (ref 8–23)
CHLORIDE: 113 mmol/L — AB (ref 98–111)
CO2: 25 mmol/L (ref 22–32)
CREATININE: 0.71 mg/dL (ref 0.61–1.24)
Calcium: 8.5 mg/dL — ABNORMAL LOW (ref 8.9–10.3)
GFR calc non Af Amer: >60 mL/min (ref >60)
Glucose, Bld: 109 mg/dL — ABNORMAL HIGH (ref 70–99)
POTASSIUM: 3.8 mmol/L (ref 3.5–5.1)
SODIUM: 145 mmol/L (ref 135–145)

## 2017-11-06 NOTE — Progress Notes (Signed)
PROGRESS NOTE    Kristopher Jennings  YSA:[id number] DOB: [birth year] DOA: 11/03/2017 PCP: [place] Carol, MD  Brief Narrative: 77 y.o.malewith medical history significant ofParkinson's disease, dysphasia,nonverbal status,essential tremor, BPH, stroke, GERD, hypothyroidism, depression, anxiety,recurrentaspiration pneumonia, who presents with fever.  Pt is nonverbal and cannot walk due to rigidity. Today pt was found to have fever in facility.They did chest x-ray, which showed pneumonia. Patient was given 1 dose of IM Rocephin injection andtransferred to ED for further evaluation and treatment.Due tononverbal status, it is difficult to get any history from the patient directly,therefore, most of the history is obtained by discussing the case with ED physician, per EMS report, and with the nursing staff. Pt seems to have mild cough, but no acuterespiratory distress. No active nausea, vomiting, diarrhea notated. When I saw pt in ED, he is alert, he shakes her head when asked if he has any chest pain. He seems to havecentral abdominal tenderness on physical examination. Per DEP which contacted SNF, pt'smental status is at his baseline.  ED Course:pt was found to haveWBC 19.2, lactic acid of 1.38, sodium 148, creatinine 1.07, temperature 102.2, tachycardia, tachypnea, oxygen saturation 95% on 2 L nasal cannula oxygen. Chest x-ray showed infiltration in LML and LLL.  Assessment & Plan:   Principal Problem:   Sepsis ([place]) Active Problems:   Hypothyroidism   Essential hypertension, benign   BPH (benign prostatic hyperplasia)   Aspiration pneumonia (HCC)   HCAP (healthcare-associated pneumonia)   Dysphagia   Abdominal tenderness  1]healthcare associated pneumonia vs aspiration pneumonia-patient met criteria for sepsis with fever leukocytosis tachycardia and tachypnea. Continue vancomycin and Zosyn.  Reviewed speech therapy notes patient with severe oropharyngeal dysphagia.   Discussed with the patient's son and his wife.  Reviewed Dr. Kirstie Mirza notes from today.  Plan at this time is to continue antibiotics and allow him to drink clear liquids which he enjoys keeping in mind that he is aspirating.   To come to final decision tomorrow regarding residential hospice versus going back to SNF with hospice.  2]Parkinson's disease with dysphagia and await swallow evaluation. Patient takes Sensipar at home.  3]hypothyroidism on IV Synthroid now since he is n.p.o.  4]hypertension blood pressure stable continue PRN IV hydralazine.  5]history of BPH Flomax on hold  6]FEN patient is n.p.o. status due to high aspiration risk. I will change his fluids to half-normal saline. Sodium remains stable at 145  7] hypokalemia is being repleted.  Potassium 3.8 today.     DVT prophylaxis:heparin Code Status do not Family Communication: Discussed with patient's son and wife Disposition Plan: TBD Consultants:  Palliative care Procedures: None Antimicrobials: Vanco and Zosyn  Subjective: Resting in bed awake.  He recognized his son and son's wife and told me the names.  He asked for ginger ale verbally.   Objective: Vitals:   11/05/17 1957 11/05/17 2023 11/06/17 0443 11/06/17 0753  BP:  118/63 123/75   Pulse:  (!) 43 72   Resp:  18 18   Temp:  98.8 F (37.1 C) 98 F (36.7 C)   TempSrc:  Oral    SpO2: 98% 96% 98% 95%  Weight:      Height:        Intake/Output Summary (Last 24 hours) at 11/06/2017 1058 Last data filed at 11/06/2017 0600 Gross per 24 hour  Intake 3103.75 ml  Output 100 ml  Net 3003.75 ml   Filed Weights   11/04/17 0802  Weight: 52.5 kg    Examination:  General exam:  Appears calm and comfortable  Respiratory system: Rhonchi scattered auscultation. Respiratory effort normal. Cardiovascular system: S1 & S2 heard, RRR. No JVD, murmurs, rubs, gallops or clicks. No pedal edema. Gastrointestinal system: Abdomen is nondistended, soft  and nontender. No organomegaly or masses felt. Normal bowel sounds heard. Extremities: Symmetric 5 x 5 power. Skin: No rashes, lesions or ulcers     Data Reviewed: I have personally reviewed following labs and imaging studies  CBC: Recent Labs  Lab 11/03/17 1827 11/05/17 0423 11/06/17 0406  WBC 19.2* 14.0* 13.2*  NEUTROABS 16.0*  --   --   HGB 13.7 11.1* 11.4*  HCT 41.0 34.2* 35.1*  MCV 94.0 95.0 94.9  PLT 226 202 791   Basic Metabolic Panel: Recent Labs  Lab 11/03/17 1827 11/04/17 0117 11/05/17 0423 11/06/17 0406  NA 148*  --  145 145  K 3.9  --  2.7* 3.8  CL 111  --  114* 113*  CO2 22  --  23 25  GLUCOSE 129*  --  109* 109*  BUN 33*  --  23 15  CREATININE 1.07 0.87 0.77 0.71  CALCIUM 9.4  --  8.8* 8.5*   GFR: Estimated Creatinine Clearance: 59.2 mL/min (by C-G formula based on SCr of 0.71 mg/dL). Liver Function Tests: Recent Labs  Lab 11/03/17 1827  AST 15  ALT <5  ALKPHOS 85  BILITOT 1.0  PROT 7.8  ALBUMIN 3.3*   Recent Labs  Lab 11/03/17 2159  LIPASE 24   No results for input(s): AMMONIA in the last 168 hours. Coagulation Profile: Recent Labs  Lab 11/04/17 0117  INR 1.31   Cardiac Enzymes: No results for input(s): CKTOTAL, CKMB, CKMBINDEX, TROPONINI in the last 168 hours. BNP (last 3 results) No results for input(s): PROBNP in the last 8760 hours. HbA1C: No results for input(s): HGBA1C in the last 72 hours. CBG: No results for input(s): GLUCAP in the last 168 hours. Lipid Profile: No results for input(s): CHOL, HDL, LDLCALC, TRIG, CHOLHDL, LDLDIRECT in the last 72 hours. Thyroid Function Tests: No results for input(s): TSH, T4TOTAL, FREET4, T3FREE, THYROIDAB in the last 72 hours. Anemia Panel: No results for input(s): VITAMINB12, FOLATE, FERRITIN, TIBC, IRON, RETICCTPCT in the last 72 hours. Sepsis Labs: Recent Labs  Lab 11/03/17 1818 11/03/17 2159 11/04/17 0117  PROCALCITON  --  1.25  --   LATICACIDVEN 1.38 2.3* 1.5    Recent  Results (from the past 240 hour(s))  Blood Culture (routine x 2)     Status: None (Preliminary result)   Collection Time: 11/03/17  5:30 PM  Result Value Ref Range Status   Specimen Description   Final    BLOOD BLOOD LEFT FOREARM Performed at [facility], [place] [address], [place], [place] [address]    Special Requests   Final    BOTTLES DRAWN AEROBIC AND ANAEROBIC Blood Culture results may not be optimal due to an inadequate volume of blood received in culture bottles Performed at [facility] [address] [place], [place] [address]    Culture   Final    NO GROWTH 2 DAYS Performed at [facility] [address] [place], [place] [address]    Report Status PENDING  Incomplete  Blood Culture (routine x 2)     Status: None (Preliminary result)   Collection Time: 11/03/17  6:12 PM  Result Value Ref Range Status   Specimen Description   Final    BLOOD BLOOD RIGHT FOREARM Performed at  [facility], [place] [address] [place], [place] [address]    Special Requests   Final    BOTTLES DRAWN AEROBIC AND ANAEROBIC Blood Culture adequate volume Performed at [facility] [address], [place], [place] [address]    Culture  Setup Time   Final    GRAM POSITIVE COCCI AEROBIC BOTTLE ONLY CRITICAL RESULT CALLED TO, READ BACK BY AND VERIFIED WITH: Yevette Edwards [id number] MLM Performed at [facility], [place] [address], [place], [place] [address]    Culture GRAM POSITIVE COCCI  Final   Report Status PENDING  Incomplete  Blood Culture ID Panel (Reflexed)     Status: Abnormal   Collection Time: 11/03/17  6:12 PM  Result Value Ref Range Status   Enterococcus species NOT DETECTED NOT DETECTED Final   Listeria monocytogenes NOT DETECTED NOT DETECTED Final   Staphylococcus species DETECTED (A) NOT DETECTED Final    Comment: Methicillin (oxacillin) resistant coagulase negative staphylococcus. Possible  blood culture contaminant (unless isolated from more than one blood culture draw or clinical case suggests pathogenicity). No antibiotic treatment is indicated for blood  culture contaminants. CRITICAL RESULT CALLED TO, READ BACK BY AND VERIFIED WITH: PHARMD L POINDEXTER [id number] MLM    Staphylococcus aureus NOT DETECTED NOT DETECTED Final   Methicillin resistance DETECTED (A) NOT DETECTED Final    Comment: CRITICAL RESULT CALLED TO, READ BACK BY AND VERIFIED WITH: PHARMD L POINDEXTER [id number] MLM    Streptococcus species NOT DETECTED NOT DETECTED Final   Streptococcus agalactiae NOT DETECTED NOT DETECTED Final   Streptococcus pneumoniae NOT DETECTED NOT DETECTED Final   Streptococcus pyogenes NOT DETECTED NOT DETECTED Final   Acinetobacter baumannii NOT DETECTED NOT DETECTED Final   Enterobacteriaceae species NOT DETECTED NOT DETECTED Final   Enterobacter cloacae complex NOT DETECTED NOT DETECTED Final   Escherichia coli NOT DETECTED NOT DETECTED Final   Klebsiella oxytoca NOT DETECTED NOT DETECTED Final   Klebsiella pneumoniae NOT DETECTED NOT DETECTED Final   Proteus species NOT DETECTED NOT DETECTED Final   Serratia marcescens NOT DETECTED NOT DETECTED Final   Haemophilus influenzae NOT DETECTED NOT DETECTED Final   Neisseria meningitidis NOT DETECTED NOT DETECTED Final   Pseudomonas aeruginosa NOT DETECTED NOT DETECTED Final   Candida albicans NOT DETECTED NOT DETECTED Final   Candida glabrata NOT DETECTED NOT DETECTED Final   Candida krusei NOT DETECTED NOT DETECTED Final   Candida parapsilosis NOT DETECTED NOT DETECTED Final   Candida tropicalis NOT DETECTED NOT DETECTED Final    Comment: Performed at [facility], [address], [place], [place] [address]  Urine culture     Status: None   Collection Time: 11/04/17  9:53 AM  Result Value Ref Range Status   Specimen Description   Final    URINE, CATHETERIZED Performed at [facility]  [address], [place], [place] [address]    Special Requests   Final    NONE Performed at [facility], [place] [address] [place], [place] [address]    Culture   Final    NO GROWTH Performed at [facility], [place] [address] [place], [place] [address]    Report Status 11/05/2017 FINAL  Final  MRSA PCR Screening     Status: Abnormal   Collection Time: 11/04/17  9:53 AM  Result Value Ref Range Status   MRSA by PCR POSITIVE (A) NEGATIVE Final    Comment:  The GeneXpert MRSA Assay (FDA approved for NASAL specimens only), is one component of a comprehensive MRSA colonization surveillance program. It is not intended to diagnose MRSA infection nor to guide or monitor treatment for MRSA infections. RESULT CALLED TO, READ BACK BY AND VERIFIED WITH: Cyndia Diver [id number] @ [facility] Performed at [facility] [address], [place], [place] [address]          Radiology Studies: No results found.      Scheduled Meds: . albuterol  2.5 mg Nebulization TID  . Chlorhexidine Gluconate Cloth  6 each Topical Q0600  . enoxaparin (LOVENOX) injection  40 mg Subcutaneous Q24H  . levothyroxine  75 mcg Intravenous Daily  . mupirocin ointment  1 application Nasal BID   Continuous Infusions: . sodium chloride 75 mL/hr at 11/05/17 1137  . piperacillin-tazobactam (ZOSYN)  IV 3.375 g (11/06/17 0333)  . vancomycin 1,250 mg (11/05/17 2239)     LOS: 3 days     Georgette Shell, MD [facility]  If 7PM-7AM, please contact night-coverage [email or web address] Password [facility] 11/06/2017, 10:58 AM

## 2017-11-06 NOTE — Progress Notes (Signed)
Daily Progress Note   Patient Name: Kristopher Jennings       Date: 11/06/2017 DOB: 28-Jul-1940  Age: 77 y.o. MRN#: [id number] Attending Physician: Georgette Shell, MD Primary Care Physician: [place] Carol, MD Admit Date: 11/03/2017  Reason for Consultation/Follow-up: Establishing goals of care  Subjective: I met today with patient, his son Delsa Bern, and his daughter-in-law Sharyn Lull.  Reports today doing "50-50."  Mr. Littles and his son have discussed this morning's the options we discussed yesterday including transition to residential hospice versus trying to continue treating current pneumonia and work to get back to skilled facility (potentially with hospice support).  His son indicates to me today that he believes that residential hospice is the correct option, however, this morning his father told him that he wants to "keep doing what we have been doing."  He feels that sometimes his father understands what is going on, and sometimes his father does not understand what is going on.  He wants to give him the opportunity to share his voice regarding his wishes moving forward if this is a possibility.  At the same time, he states that if in the decision is not made by his father by tomorrow, he feels that we need to make one on his behalf.  Delsa Bern believes family needs more time to process decision, but he also asked me questions about options for residential hospice including locations.  States his father's friends are in [facility] and this would be an ideal location from his standpoint.  Length of Stay: 3  Current Medications: Scheduled Meds:  . albuterol  2.5 mg Nebulization TID  . Chlorhexidine Gluconate Cloth  6 each Topical Q0600  . enoxaparin (LOVENOX) injection  40 mg Subcutaneous  Q24H  . levothyroxine  75 mcg Intravenous Daily  . mupirocin ointment  1 application Nasal BID    Continuous Infusions: . sodium chloride 75 mL/hr at 11/05/17 1137  . piperacillin-tazobactam (ZOSYN)  IV 3.375 g (11/06/17 0333)  . vancomycin 1,250 mg (11/05/17 2239)    PRN Meds: acetaminophen **OR** acetaminophen, hydrALAZINE, levalbuterol, morphine injection, ondansetron **OR** ondansetron (ZOFRAN) IV  Physical Exam   Frail thin gentleman Resting in bed Few coarse scattered breath sounds, sounds congested when attempts to clear throat S1 S2 Abdomen soft No edema Awake, answers questions appropriately  Vital Signs: BP 123/75 (BP Location: Right Arm)   Pulse 72   Temp 98 F (36.7 C)   Resp 18   Ht 6' (1.829 m)   Wt 52.5 kg   SpO2 95%   BMI 15.70 kg/m  SpO2: SpO2: 95 % O2 Device: O2 Device: Nasal Cannula O2 Flow Rate: O2 Flow Rate (L/min): 2 L/min  Intake/output summary:   Intake/Output Summary (Last 24 hours) at 11/06/2017 0912 Last data filed at 11/06/2017 0600 Gross per 24 hour  Intake 3103.75 ml  Output 100 ml  Net 3003.75 ml   LBM: Last BM Date: 11/04/17 Baseline Weight: Weight: 52.5 kg Most recent weight: Weight: 52.5 kg       Palliative Assessment/Data:    Flowsheet Rows     Most Recent Value  Intake Tab  Referral Department  Hospitalist  Unit at Time of Referral  Med/Surg Unit  Palliative Care Primary Diagnosis  Sepsis/Infectious Disease  Date Notified  11/04/17  Palliative Care Type  Return patient Palliative Care  Reason for referral  Clarify Goals of Care  Date of Admission  11/03/17  Date first seen by Palliative Care  11/05/17  # of days Palliative referral response time  1 Day(s)  # of days IP prior to Palliative referral  1  Clinical Assessment  Palliative Performance Scale Score  30%  Psychosocial & Spiritual Assessment  Palliative Care Outcomes  Patient/Family meeting held?  Yes  Who was at the meeting?  Son via phone    Palliative Care Outcomes  Counseled regarding hospice, Clarified goals of care      Patient Active Problem List   Diagnosis Date Noted  . Sepsis ([place]) 11/03/2017  . Dysphagia 11/03/2017  . Abdominal tenderness 11/03/2017  . H/O: stroke 08/29/2017  . Debility 08/29/2017  . PNA (pneumonia) 08/29/2017  . Protein-calorie malnutrition, severe 07/23/2016  . HCAP (healthcare-associated pneumonia) 07/21/2016  . Encounter for palliative care   . Goals of care, counseling/discussion   . Pressure injury of skin 03/20/2016  . Lobar pneumonia ([place]) 03/19/2016  . Acute encephalopathy 03/19/2016  . Aspiration pneumonia ([place]) 03/19/2016  . BPH (benign prostatic hyperplasia) 01/01/2014  . Thyroid activity decreased 10/04/2013  . Fever, unspecified 10/03/2013  . Unspecified constipation 10/02/2013  . Other specified disease of white blood cells 10/02/2013  . Cough 05/03/2013  . Urinary tract infection, site not specified 02/26/2013  . Urinary hesitancy 02/19/2013  . Parkinson's disease ([place]) 01/12/2013  . Essential hypertension, benign 01/08/2013  . Hypertrophy of prostate without urinary obstruction and other lower urinary tract symptoms (LUTS) 01/08/2013  . Anxiety state, unspecified 01/08/2013  . Benign essential tremor 12/13/2012  . H/O urinary retention 12/13/2012  . Hypothyroidism 12/13/2012  . Insomnia 12/13/2012    Palliative Care Assessment & Plan   Patient Profile: 77 year old gentleman, resident of [facility], history of stroke, Parkinson's disease, recurrent aspiration pneumonia, essentially bed bound at his nursing facility due to rigidity, generalized weakness, admitted to the hospital from skilled nursing facility to hospital medicine service with fever, sepsis likely from recurrent aspiration.   Recommendations/Plan: 1. Continue antibiotics and current course. 2.  Son/HC POA believes that his father is likely at a point where if he understood his situation his goal would  be to focus on comfort and dignity with plan for transition to residential hospice for end-of-life care.  He reports discussing this with his father this AM and that his father needs more time to consider this.  Plan for follow-up meeting tomorrow.Marland Kitchen  Code Status:    Code Status Orders  (From admission, onward)         Start     Ordered   11/03/17 2019  Do not attempt resuscitation (DNR)  Continuous    Question Answer Comment  In the event of cardiac or respiratory ARREST Do not call a "code blue"   In the event of cardiac or respiratory ARREST Do not perform Intubation, CPR, defibrillation or ACLS   In the event of cardiac or respiratory ARREST Use medication by any route, position, wound care, and other measures to relive pain and suffering. May use oxygen, suction and manual treatment of airway obstruction as needed for comfort.      11/03/17 2019        Code Status History    Date Active Date Inactive Code Status Order ID Comments User Context   08/29/2017 0911 09/04/2017 1809 DNR [id number]  Shelly Coss, MD ED   07/21/2016 0548 07/24/2016 1929 DNR [id number]  Schorr, Rhetta Mura, NP Inpatient   07/21/2016 0157 07/21/2016 0548 Full Code [id number]  Lily Kocher, MD ED   07/21/2016 0152 07/21/2016 0157 DNR [id number]  Lily Kocher, MD ED   03/21/2016 0834 03/23/2016 1805 DNR [id number]  Geradine Girt, DO Inpatient   03/19/2016 1620 03/21/2016 0834 Full Code [id number]  Samuella Cota, MD Inpatient       Prognosis: -  To be determined.  He is going to continue to aspirate regardless of interventions moving forward.   I believe he has an expected prognosis of less than 6 months and should qualify for services at long-term care facility if so desired.  If decision is made for transition to full comfort care with discontinuation of current antibiotics, I believe that he will continue to progress quickly with an expected prognosis of less than 2 weeks.  At this point, he is not taking much  nutrition or hydration.  There is a good likelihood that we are in the last weeks of life regardless of interventions if this continues to be the case.  In this situation, I think you would best be served by transition to residential hospice for end-of-life care.  Discharge Planning:  To Be Determined  Care plan was discussed with patient, son/HCPOA  Thank you for allowing the Palliative Medicine Team to assist in the care of this patient.   Total Time 30 Prolonged Time Billed No      Greater than 50%  of this time was spent counseling and coordinating care related to the above assessment and plan.  Micheline Rough, MD  Please contact Palliative Medicine Team phone at [phone] for questions and concerns.

## 2017-11-06 NOTE — Plan of Care (Signed)
  Problem: Health Behavior/Discharge Planning: Goal: Ability to manage health-related needs will improve Outcome: Progressing   Problem: Clinical Measurements: Goal: Diagnostic test results will improve Outcome: Progressing Goal: Respiratory complications will improve Outcome: Progressing Goal: Cardiovascular complication will be avoided Outcome: Progressing   Problem: Nutrition: Goal: Adequate nutrition will be maintained Outcome: Progressing   Problem: Coping: Goal: Level of anxiety will decrease Outcome: Progressing   Problem: Elimination: Goal: Will not experience complications related to urinary retention Outcome: Progressing   Problem: Pain Managment: Goal: General experience of comfort will improve Outcome: Progressing   Problem: Safety: Goal: Ability to remain free from injury will improve Outcome: Progressing   Problem: Skin Integrity: Goal: Risk for impaired skin integrity will decrease Outcome: Progressing

## 2017-11-07 LAB — CULTURE, BLOOD (ROUTINE X 2): SPECIAL REQUESTS: ADEQUATE

## 2017-11-07 LAB — BASIC METABOLIC PANEL
ANION GAP: 7 (ref 5–15)
BUN: 10 mg/dL (ref 8–23)
CHLORIDE: 108 mmol/L (ref 98–111)
CO2: 27 mmol/L (ref 22–32)
Calcium: 8.4 mg/dL — ABNORMAL LOW (ref 8.9–10.3)
Creatinine, Ser: 0.7 mg/dL (ref 0.61–1.24)
GFR calc non Af Amer: >60 mL/min (ref >60)
Glucose, Bld: 120 mg/dL — ABNORMAL HIGH (ref 70–99)
Potassium: 3.4 mmol/L — ABNORMAL LOW (ref 3.5–5.1)
Sodium: 142 mmol/L (ref 135–145)

## 2017-11-07 LAB — CBC
HCT: 35.2 % — ABNORMAL LOW (ref 39.0–52.0)
HEMOGLOBIN: 11.6 g/dL — AB (ref 13.0–17.0)
MCH: 30.8 pg (ref 26.0–34.0)
MCHC: 33 g/dL (ref 30.0–36.0)
MCV: 93.4 fL (ref 78.0–100.0)
Platelets: 178 10*3/uL (ref 150–400)
RBC: 3.77 MIL/uL — AB (ref 4.22–5.81)
RDW: 13.7 % (ref 11.5–15.5)
WBC: 13.6 10*3/uL — ABNORMAL HIGH (ref 4.0–10.5)

## 2017-11-07 LAB — GLUCOSE, CAPILLARY
GLUCOSE-CAPILLARY: 83 mg/dL (ref 70–99)
GLUCOSE-CAPILLARY: 97 mg/dL (ref 70–99)

## 2017-11-07 MED ORDER — GLYCOPYRROLATE 0.2 MG/ML IJ SOLN
0.2000 mg | INTRAMUSCULAR | Status: DC | PRN
  Filled 2017-11-07: qty 1

## 2017-11-07 MED ORDER — LORAZEPAM 1 MG PO TABS: 1.0000 mg | ORAL_TABLET | ORAL | Status: DC | PRN

## 2017-11-07 MED ORDER — MORPHINE SULFATE (PF) 2 MG/ML IV SOLN
1.0000 mg | INTRAVENOUS | Status: DC | PRN
  Administered 2017-11-07 – 2017-11-08 (×4): 2 mg via INTRAVENOUS
  Administered 2017-11-09: 1 mg via INTRAVENOUS
  Filled 2017-11-07 (×5): qty 1

## 2017-11-07 MED ORDER — LORAZEPAM 2 MG/ML IJ SOLN: 1.0000 mg | INTRAMUSCULAR | Status: DC | PRN

## 2017-11-07 MED ORDER — HALOPERIDOL 0.5 MG PO TABS
0.5000 mg | ORAL_TABLET | ORAL | Status: DC | PRN
  Filled 2017-11-07: qty 1

## 2017-11-07 MED ORDER — GLYCOPYRROLATE 1 MG PO TABS
1.0000 mg | ORAL_TABLET | ORAL | Status: DC | PRN
  Filled 2017-11-07: qty 1

## 2017-11-07 MED ORDER — MORPHINE SULFATE 10 MG/5ML PO SOLN
5.0000 mg | Freq: Four times a day (QID) | ORAL | Status: DC
  Administered 2017-11-07 – 2017-11-09 (×8): 5 mg via ORAL
  Filled 2017-11-07 (×9): qty 5

## 2017-11-07 MED ORDER — HALOPERIDOL LACTATE 2 MG/ML PO CONC
0.5000 mg | ORAL | Status: DC | PRN
  Filled 2017-11-07: qty 0.3

## 2017-11-07 MED ORDER — BISACODYL 10 MG RE SUPP: 10.0000 mg | Freq: Every day | RECTAL | Status: DC | PRN

## 2017-11-07 MED ORDER — LORAZEPAM 2 MG/ML PO CONC: 1.0000 mg | ORAL | Status: DC | PRN

## 2017-11-07 MED ORDER — MORPHINE SULFATE (PF) 2 MG/ML IV SOLN: 1.0000 mg | INTRAVENOUS | Status: DC | PRN

## 2017-11-07 MED ORDER — HALOPERIDOL LACTATE 5 MG/ML IJ SOLN: 0.5000 mg | INTRAMUSCULAR | Status: DC | PRN

## 2017-11-07 NOTE — Progress Notes (Signed)
  Speech Language Pathology Treatment: Dysphagia  Patient Details Name: Kristopher Jennings MRN: [id number] DOB: 29-Nov-1940 Today's Date: 11/07/2017 Time: 7517-0017 SLP Time Calculation (min) (ACUTE ONLY): 55 min  Assessment / Plan / Recommendation Clinical Impression  Today pt states he is "dying" and he does desire to have solid foods.  He admits his dysphagia is worse than baseline.  SLP assisted pt to consume jello (he consumed all of his jello) with ongoing symptoms of aspiration - weak congested cough.  SLP offered to obtain solids - danish, etc to try but pt denies desire to have solids at this time.  Educated him to better pulmonary clearance with liquids and to assure he is comfortable with eating - taking only very small bites of solids for taste.  Pt reports dys3/thin diet being acceptable to him for comfort.  All education completed and no follow up needed.  Thanks for allowing me to help care for this pt.   Of note, pt became tearful during session and SLP offered him emotional support.  He did not advise yes or no to desire to see chaplain - but repeated "jello" when SLP asked questions he likely did not desire to answer.  Pt seems sad in dealing with his critical illness/EOL issues.    HPI HPI: 77 yo male adm to [facility] with congestion, AMS, found to have left lobe ATX/infiltrate and sepsis.  Pt with h/o Parkinson's disease, cva, severe dysphagia and has been on honey thickened liquids at facility per SLP conversation with son.  Prior MBS 07/2016 indicated chronic aspiration risk due to severity of dysphagia and clears recommended for improved lung absorption.  Pt NPO currently.       SLP Plan  All goals met       Recommendations  Diet recommendations: Dysphagia 3 (mechanical soft);Thin liquid(to honor pt's wishes) Liquids provided via: Cup;No straw;Teaspoon Medication Administration: Crushed with puree Compensations: Minimize environmental distractions;Slow rate;Small  sips/bites;Follow solids with liquid(stop intake when pt coughing) Postural Changes and/or Swallow Maneuvers: Seated upright 90 degrees;Upright 30-60 min after meal                Oral Care Recommendations: (oral care prior to po intake) Follow up Recommendations: None SLP Visit Diagnosis: Dysphagia, oropharyngeal phase (R13.12) Plan: All goals met       GO                Macario Golds 11/07/2017, 7:29 PM   Luanna Salk, [place] [facility] SLP [phone]

## 2017-11-07 NOTE — Progress Notes (Signed)
Daily Progress Note   Patient Name: Kristopher Jennings       Date: 11/07/2017 DOB: 07-Feb-1941  Age: 77 y.o. MRN#: [id number] Attending Physician: Georgette Shell, MD Primary Care Physician: Seward Carol, MD Admit Date: 11/03/2017  Reason for Consultation/Follow-up: Establishing goals of care  Subjective: I met today with patient and his son Delsa Bern.  Reports "not good" today.    I asked his thoughts on things moving forward, and he stated that he is "ready to go."  When asked to clarify, he notes that he wants to work on getting out of the hospital and focusing on comfort.  We discussed options moving forward, and he and his son are in agreement that he would best be served by transitioning to full comfort care and working to transition to residential hospice for end-of-life care.  Symptomatically, his main complaint point is pain in his hips.  He also has some intermittent shortness of breath and coughing.  Length of Stay: 4  Current Medications: Scheduled Meds:  . albuterol  2.5 mg Nebulization TID  . Chlorhexidine Gluconate Cloth  6 each Topical Q0600  . morphine  5 mg Oral Q6H  . mupirocin ointment  1 application Nasal BID    Continuous Infusions: . sodium chloride 75 mL/hr at 11/07/17 0429    PRN Meds: acetaminophen **OR** acetaminophen, bisacodyl, glycopyrrolate **OR** glycopyrrolate **OR** glycopyrrolate, haloperidol **OR** haloperidol **OR** haloperidol lactate, levalbuterol, LORazepam **OR** LORazepam **OR** LORazepam, morphine injection, ondansetron **OR** ondansetron (ZOFRAN) IV  Physical Exam   Frail thin gentleman Resting in bed Few coarse scattered breath sounds, sounds congested when attempts to clear throat S1 S2 Abdomen soft No edema Awake, answers  questions appropriately but slow to respond      Vital Signs: BP (!) 105/45 (BP Location: Left Arm)   Pulse 65   Temp 98.2 F (36.8 C) (Oral)   Resp 18   Ht 6' (1.829 m)   Wt 52.5 kg   SpO2 98%   BMI 15.70 kg/m  SpO2: SpO2: 98 % O2 Device: O2 Device: Nasal Cannula O2 Flow Rate: O2 Flow Rate (L/min): 2 L/min  Intake/output summary:   Intake/Output Summary (Last 24 hours) at 11/07/2017 1751 Last data filed at 11/07/2017 1500 Gross per 24 hour  Intake 1416.17 ml  Output 1025 ml  Net 391.17 ml  LBM: Last BM Date: 11/07/17 Baseline Weight: Weight: 52.5 kg Most recent weight: Weight: 52.5 kg       Palliative Assessment/Data:    Flowsheet Rows     Most Recent Value  Intake Tab  Referral Department  Hospitalist  Unit at Time of Referral  Med/Surg Unit  Palliative Care Primary Diagnosis  Sepsis/Infectious Disease  Date Notified  11/04/17  Palliative Care Type  Return patient Palliative Care  Reason for referral  Clarify Goals of Care  Date of Admission  11/03/17  Date first seen by Palliative Care  11/05/17  # of days Palliative referral response time  1 Day(s)  # of days IP prior to Palliative referral  1  Clinical Assessment  Palliative Performance Scale Score  30%  Psychosocial & Spiritual Assessment  Palliative Care Outcomes  Patient/Family meeting held?  Yes  Who was at the meeting?  Son via phone  Palliative Care Outcomes  Counseled regarding hospice, Clarified goals of care      Patient Active Problem List   Diagnosis Date Noted  . Sepsis ([place]) 11/03/2017  . Dysphagia 11/03/2017  . Abdominal tenderness 11/03/2017  . H/O: stroke 08/29/2017  . Debility 08/29/2017  . PNA (pneumonia) 08/29/2017  . Protein-calorie malnutrition, severe 07/23/2016  . HCAP (healthcare-associated pneumonia) 07/21/2016  . Encounter for palliative care   . Goals of care, counseling/discussion   . Pressure injury of skin 03/20/2016  . Lobar pneumonia ([place]) 03/19/2016  . Acute  encephalopathy 03/19/2016  . Aspiration pneumonia ([place]) 03/19/2016  . BPH (benign prostatic hyperplasia) 01/01/2014  . Thyroid activity decreased 10/04/2013  . Fever, unspecified 10/03/2013  . Unspecified constipation 10/02/2013  . Other specified disease of white blood cells 10/02/2013  . Cough 05/03/2013  . Urinary tract infection, site not specified 02/26/2013  . Urinary hesitancy 02/19/2013  . Parkinson's disease ([place]) 01/12/2013  . Essential hypertension, benign 01/08/2013  . Hypertrophy of prostate without urinary obstruction and other lower urinary tract symptoms (LUTS) 01/08/2013  . Anxiety state, unspecified 01/08/2013  . Benign essential tremor 12/13/2012  . H/O urinary retention 12/13/2012  . Hypothyroidism 12/13/2012  . Insomnia 12/13/2012    Palliative Care Assessment & Plan   Patient Profile: 77 year old gentleman, resident of [facility], history of stroke, Parkinson's disease, recurrent aspiration pneumonia, essentially bed bound at his nursing facility due to rigidity, generalized weakness, admitted to the hospital from skilled nursing facility to hospital medicine service with fever, sepsis likely from recurrent aspiration.   Recommendations/Plan: 1.  Plan for full comfort care.  Discussed stopping antibiotics, lab draws, and other interventions that are not likely to add comfort long-term. 2.  Plan for additional comfort medications as needed.  Main complaint at this point time is pain.  We will plan for addition of scheduled morphine every 6 hours with additional breakthrough medication every 2 hours as needed. 3.  His overall goal is to transition to residential hospice for end-of-life care.  I placed a referral for social work for assistance in facilitating this.  He is from [facility] and wishes to transition to [facility] of [facility] if possible.  Code Status:    Code Status Orders  (From admission, onward)         Start     Ordered   11/03/17 2019   Do not attempt resuscitation (DNR)  Continuous    Question Answer Comment  In the event of cardiac or respiratory ARREST Do not call a "code blue"   In the event  of cardiac or respiratory ARREST Do not perform Intubation, CPR, defibrillation or ACLS   In the event of cardiac or respiratory ARREST Use medication by any route, position, wound care, and other measures to relive pain and suffering. May use oxygen, suction and manual treatment of airway obstruction as needed for comfort.      11/03/17 2019        Code Status History    Date Active Date Inactive Code Status Order ID Comments User Context   08/29/2017 0911 09/04/2017 1809 DNR [id number]  Shelly Coss, MD ED   07/21/2016 0548 07/24/2016 1929 DNR [id number]  [place], Rhetta Mura, NP Inpatient   07/21/2016 0157 07/21/2016 0548 Full Code [id number]  Lily Kocher, MD ED   07/21/2016 0152 07/21/2016 0157 DNR [id number]  Lily Kocher, MD ED   03/21/2016 0834 03/23/2016 1805 DNR [id number]  Geradine Girt, DO Inpatient   03/19/2016 1620 03/21/2016 0834 Full Code [id number]  Samuella Cota, MD Inpatient       Prognosis: -   2 weeks most likely.  He is going to continue to aspirate regardless of interventions moving forward.   With decision to transition to full comfort care including discontinuation of current antibiotics, I believe that he will continue to progress quickly with an expected prognosis of less than 2 weeks, particularly as he continues to aspirate and plan to liberalize diet for pleasure feed.  At this point, he is not taking much nutrition or hydration.  Additionally, he has uncontrolled symptoms such as pain and shortness of breath and cough that are going to worsen.  I believe he would best be served by transition to residential hospice for end-of-life care.  Discharge Planning:  Hospice facility  Care plan was discussed with patient, son/HCPOA  Thank you for allowing the Palliative Medicine Team to assist in the care of this  patient.   Total Time 40 Prolonged Time Billed No      Greater than 50%  of this time was spent counseling and coordinating care related to the above assessment and plan.  Micheline Rough, MD  Please contact Palliative Medicine Team phone at [phone] for questions and concerns.

## 2017-11-07 NOTE — Progress Notes (Signed)
PROGRESS NOTE    Kristopher Jennings  FIE:[id number] DOB: [birth year] DOA: 11/03/2017 PCP: Seward Carol, MD  Brief Narrative: 77 y.o.malewith medical history significant ofParkinson's disease, dysphasia,nonverbal status,essential tremor, BPH, stroke, GERD, hypothyroidism, depression, anxiety,recurrentaspiration pneumonia, who presents with fever.  Pt is nonverbal and cannot walk due to rigidity. Today pt was found to have fever in facility.They did chest x-ray, which showed pneumonia. Patient was given 1 dose of IM Rocephin injection andtransferred to ED for further evaluation and treatment.Due tononverbal status, it is difficult to get any history from the patient directly,therefore, most of the history is obtained by discussing the case with ED physician, per EMS report, and with the nursing staff. Pt seems to have mild cough, but no acuterespiratory distress. No active nausea, vomiting, diarrhea notated. When I saw pt in ED, he is alert, he shakes her head when asked if he has any chest pain. He seems to havecentral abdominal tenderness on physical examination. Per DEP which contacted SNF, pt'smental status is at his baseline.  ED Course:pt was found to haveWBC 19.2, lactic acid of 1.38, sodium 148, creatinine 1.07, temperature 102.2, tachycardia, tachypnea, oxygen saturation 95% on 2 L nasal cannula oxygen. Chest x-ray showed infiltration in LML and LLL.   Assessment & Plan:   Principal Problem:   Sepsis ([place]) Active Problems:   Hypothyroidism   Essential hypertension, benign   BPH (benign prostatic hyperplasia)   Aspiration pneumonia ([facility])   HCAP (healthcare-associated pneumonia)   Dysphagia   Abdominal tenderness 1]healthcare associated pneumoniavsaspiration pneumonia-patient met criteria for sepsis with fever leukocytosis tachycardia and tachypnea. Continue vancomycin and Zosyn.Reviewed speech therapy notes patient with severe oropharyngeal dysphagia.   Discussed with the patient's son and his wife.  Reviewed Dr. Kirstie Mirza notes   Plan at this time is to continue antibiotics and allow him to drink clear liquids which he enjoys keeping in mind that he is aspirating. Waiting for family to come to final decision regarding residential hospice versus SNF with hospice.  Patient continues to drink clear liquids with congestion in the upper airways.  2]Parkinson's disease with dysphagia  3]hypothyroidism on IV Synthroid now since he is n.p.o.  4]hypertension blood pressure stable continue PRN IV hydralazine.  5]history of BPHFlomax on hold  6]FEN patient is n.p.o. status due to high aspiration risk. I will change his fluids to half-normal saline. Sodium remains stable at 145  7]hypokalemia is being repleted.  Potassium 3.8 today.      DVT prophylaxis: heparin Code Status: DO NOT RESUSCITATE Family Communication: Discussed with son and his wife yesterday. Disposition Plan  Awaiting final decision from family members regarding hospice discharge. Consultants: Palliative care   Procedures none Antimicrobials: Vancomycin and Zosyn. Subjective: Patient awake asking me for ginger ale sounds congested  Objective: Vitals:   11/06/17 1950 11/06/17 2058 11/07/17 0521 11/07/17 0817  BP:  108/61 122/75   Pulse:  (!) 52 (!) 58   Resp:  (!) 22 (!) 25   Temp:  99 F (37.2 C) 98.6 F (37 C)   TempSrc:  Oral Oral   SpO2: 93% 100% 100% 95%  Weight:      Height:        Intake/Output Summary (Last 24 hours) at 11/07/2017 1232 Last data filed at 11/07/2017 0807 Gross per 24 hour  Intake 1200.74 ml  Output 950 ml  Net 250.74 ml   Filed Weights   11/04/17 0802  Weight: 52.5 kg    Examination:  General exam: Appears calm and comfortable  Respiratory  system: Coarse breath sounds bilaterally auscultation. Respiratory effort normal. Cardiovascular system: S1 & S2 heard, RRR. No JVD, murmurs, rubs, gallops or clicks. No pedal  edema. Gastrointestinal system: Abdomen is nondistended, soft and nontender. No organomegaly or masses felt. Normal bowel sounds heard. Central nervous system: Alert and oriented. No focal neurological deficits. Extremities: Symmetric 5 x 5 power. Skin: No rashes, lesions or ulcers Psychiatry: Judgement and insight appear normal. Mood & affect appropriate.     Data Reviewed: I have personally reviewed following labs and imaging studies  CBC: Recent Labs  Lab 11/03/17 1827 11/05/17 0423 11/06/17 0406 11/07/17 0435  WBC 19.2* 14.0* 13.2* 13.6*  NEUTROABS 16.0*  --   --   --   HGB 13.7 11.1* 11.4* 11.6*  HCT 41.0 34.2* 35.1* 35.2*  MCV 94.0 95.0 94.9 93.4  PLT 226 202 200 924   Basic Metabolic Panel: Recent Labs  Lab 11/03/17 1827 11/04/17 0117 11/05/17 0423 11/06/17 0406 11/07/17 0435  NA 148*  --  145 145 142  K 3.9  --  2.7* 3.8 3.4*  CL 111  --  114* 113* 108  CO2 22  --  23 25 27   GLUCOSE 129*  --  109* 109* 120*  BUN 33*  --  23 15 10   CREATININE 1.07 0.87 0.77 0.71 0.70  CALCIUM 9.4  --  8.8* 8.5* 8.4*   GFR: Estimated Creatinine Clearance: 59.2 mL/min (by C-G formula based on SCr of 0.7 mg/dL). Liver Function Tests: Recent Labs  Lab 11/03/17 1827  AST 15  ALT <5  ALKPHOS 85  BILITOT 1.0  PROT 7.8  ALBUMIN 3.3*   Recent Labs  Lab 11/03/17 2159  LIPASE 24   No results for input(s): AMMONIA in the last 168 hours. Coagulation Profile: Recent Labs  Lab 11/04/17 0117  INR 1.31   Cardiac Enzymes: No results for input(s): CKTOTAL, CKMB, CKMBINDEX, TROPONINI in the last 168 hours. BNP (last 3 results) No results for input(s): PROBNP in the last 8760 hours. HbA1C: No results for input(s): HGBA1C in the last 72 hours. CBG: No results for input(s): GLUCAP in the last 168 hours. Lipid Profile: No results for input(s): CHOL, HDL, LDLCALC, TRIG, CHOLHDL, LDLDIRECT in the last 72 hours. Thyroid Function Tests: No results for input(s): TSH, T4TOTAL,  FREET4, T3FREE, THYROIDAB in the last 72 hours. Anemia Panel: No results for input(s): VITAMINB12, FOLATE, FERRITIN, TIBC, IRON, RETICCTPCT in the last 72 hours. Sepsis Labs: Recent Labs  Lab 11/03/17 1818 11/03/17 2159 11/04/17 0117  PROCALCITON  --  1.25  --   LATICACIDVEN 1.38 2.3* 1.5    Recent Results (from the past 240 hour(s))  Blood Culture (routine x 2)     Status: None (Preliminary result)   Collection Time: 11/03/17  5:30 PM  Result Value Ref Range Status   Specimen Description   Final    BLOOD BLOOD LEFT FOREARM Performed at [facility], [place] [address] [place], [place] [address]    Special Requests   Final    BOTTLES DRAWN AEROBIC AND ANAEROBIC Blood Culture results may not be optimal due to an inadequate volume of blood received in culture bottles Performed at [place] [address], [place], [place] [address]    Culture   Final    NO GROWTH 3 DAYS Performed at [facility], [place] [address] [place], [place] [address]    Report Status PENDING  Incomplete  Blood Culture (routine x 2)  Status: Abnormal   Collection Time: 11/03/17  6:12 PM  Result Value Ref Range Status   Specimen Description   Final    BLOOD BLOOD RIGHT FOREARM Performed at [facility] [address] [place], [place] [address]    Special Requests   Final    BOTTLES DRAWN AEROBIC AND ANAEROBIC Blood Culture adequate volume Performed at [place] [address], [place], [place] [address]    Culture  Setup Time   Final    GRAM POSITIVE COCCI AEROBIC BOTTLE ONLY CRITICAL RESULT CALLED TO, READ BACK BY AND VERIFIED WITH: PHARMD L POINDEXTER [id number] MLM    Culture (A)  Final    STAPHYLOCOCCUS SPECIES (COAGULASE NEGATIVE) THE SIGNIFICANCE OF ISOLATING THIS ORGANISM FROM A SINGLE SET OF BLOOD CULTURES WHEN MULTIPLE SETS ARE DRAWN IS UNCERTAIN. PLEASE NOTIFY THE MICROBIOLOGY DEPARTMENT WITHIN ONE  WEEK IF SPECIATION AND SENSITIVITIES ARE REQUIRED. Performed at [facility], [place] [address], [place], [place] [address]    Report Status 11/07/2017 FINAL  Final  Blood Culture ID Panel (Reflexed)     Status: Abnormal   Collection Time: 11/03/17  6:12 PM  Result Value Ref Range Status   Enterococcus species NOT DETECTED NOT DETECTED Final   Listeria monocytogenes NOT DETECTED NOT DETECTED Final   Staphylococcus species DETECTED (A) NOT DETECTED Final    Comment: Methicillin (oxacillin) resistant coagulase negative staphylococcus. Possible blood culture contaminant (unless isolated from more than one blood culture draw or clinical case suggests pathogenicity). No antibiotic treatment is indicated for blood  culture contaminants. CRITICAL RESULT CALLED TO, READ BACK BY AND VERIFIED WITH: PHARMD L POINDEXTER [id number] MLM    Staphylococcus aureus NOT DETECTED NOT DETECTED Final   Methicillin resistance DETECTED (A) NOT DETECTED Final    Comment: CRITICAL RESULT CALLED TO, READ BACK BY AND VERIFIED WITH: PHARMD L POINDEXTER [id number] MLM    Streptococcus species NOT DETECTED NOT DETECTED Final   Streptococcus agalactiae NOT DETECTED NOT DETECTED Final   Streptococcus pneumoniae NOT DETECTED NOT DETECTED Final   Streptococcus pyogenes NOT DETECTED NOT DETECTED Final   Acinetobacter baumannii NOT DETECTED NOT DETECTED Final   Enterobacteriaceae species NOT DETECTED NOT DETECTED Final   Enterobacter cloacae complex NOT DETECTED NOT DETECTED Final   Escherichia coli NOT DETECTED NOT DETECTED Final   Klebsiella oxytoca NOT DETECTED NOT DETECTED Final   Klebsiella pneumoniae NOT DETECTED NOT DETECTED Final   Proteus species NOT DETECTED NOT DETECTED Final   Serratia marcescens NOT DETECTED NOT DETECTED Final   Haemophilus influenzae NOT DETECTED NOT DETECTED Final   Neisseria meningitidis NOT DETECTED NOT DETECTED Final   Pseudomonas aeruginosa NOT DETECTED NOT DETECTED Final    Candida albicans NOT DETECTED NOT DETECTED Final   Candida glabrata NOT DETECTED NOT DETECTED Final   Candida krusei NOT DETECTED NOT DETECTED Final   Candida parapsilosis NOT DETECTED NOT DETECTED Final   Candida tropicalis NOT DETECTED NOT DETECTED Final    Comment: Performed at [facility], [address], [place], [place] [address]  Urine culture     Status: None   Collection Time: 11/04/17  9:53 AM  Result Value Ref Range Status   Specimen Description   Final    URINE, CATHETERIZED Performed at [facility] [address], [place], [place] [address]    Special Requests   Final    NONE Performed at [facility], [place] [address] [place], [place]  [address]    Culture   Final    NO GROWTH Performed at [facility], [place] [address] [place], [place] [address]    Report Status 11/05/2017 FINAL  Final  MRSA PCR Screening     Status: Abnormal   Collection Time: 11/04/17  9:53 AM  Result Value Ref Range Status   MRSA by PCR POSITIVE (A) NEGATIVE Final    Comment:        The GeneXpert MRSA Assay (FDA approved for NASAL specimens only), is one component of a comprehensive MRSA colonization surveillance program. It is not intended to diagnose MRSA infection nor to guide or monitor treatment for MRSA infections. RESULT CALLED TO, READ BACK BY AND VERIFIED WITH: Cyndia Diver [id number] @ [place] Performed at [place] [address], [place], [place] [address]          Radiology Studies: No results found.      Scheduled Meds: . albuterol  2.5 mg Nebulization TID  . Chlorhexidine Gluconate Cloth  6 each Topical Q0600  . enoxaparin (LOVENOX) injection  40 mg Subcutaneous Q24H  . levothyroxine  75 mcg Intravenous Daily  . mupirocin ointment  1 application Nasal BID   Continuous Infusions: . sodium chloride 75 mL/hr at 11/07/17 0429  . piperacillin-tazobactam (ZOSYN)  IV 3.375 g  (11/07/17 1101)  . vancomycin 1,250 mg (11/05/17 2239)     LOS: 4 days    Georgette Shell, MD [facility] If 7PM-7AM, please contact night-coverage [email or web address] Password [facility] 11/07/2017, 12:32 PM

## 2017-11-07 NOTE — Care Management Important Message (Signed)
Important Message  Patient Details  Name: Kristopher Jennings MRN: [id number] Date of Birth: 24-Feb-1941   Medicare Important Message Given:  Yes    Caren MacadamFuller, Rawson Minix 11/07/2017, 12:23 PMImportant Message  Patient Details  Name: Kristopher Jennings MRN: [id number] Date of Birth: 24-Feb-1941   Medicare Important Message Given:  Yes    Caren MacadamFuller, Chaunce Winkels 11/07/2017, 12:23 PM

## 2017-11-08 LAB — CULTURE, BLOOD (ROUTINE X 2): Culture: NO GROWTH

## 2017-11-08 LAB — GLUCOSE, CAPILLARY: Glucose-Capillary: 89 mg/dL (ref 70–99)

## 2017-11-08 NOTE — Progress Notes (Signed)
CSW following to assist with discharge planning to residential hospice. Patient's son is scheduled to [facility] 9 AM on 11/09/17. CSW will continue to follow and assist with discharge planning.  Celso SickleKimberly Judia Arnott, [place] Clinical Social Worker [facility] Cell#: [phone]

## 2017-11-08 NOTE — Progress Notes (Signed)
Referral for [facility] given to C.H. Robinson WorldwideLiaison Cheri, [place]RN.

## 2017-11-08 NOTE — Progress Notes (Signed)
[facility]:  TC to pts son Sherrill RaringRuss. He has had to leave the hospital to go to a MD appointment and will not be avilable to meet this afternoon. Arranged meetign time for 900-930am tomorrow morning [facility]. Norm ParcelCheri Kennedy RN  [phone]

## 2017-11-08 NOTE — Discharge Summary (Addendum)
Physician Discharge Summary  Barb MerinoRussell Feijoo ZOX:[id number] DOB: 06-Jul-1940 DOA: 11/03/2017  PCP: Renford DillsPolite, Ronald, MD  Admit date: 11/03/2017 Discharge date: 11/08/2017  Admitted From: Nursing home Disposition hospice  Home Health none Equipment/Devices: None Discharge Condition patient being discharged to residential hospice CODE STATUS DO NOT RESUSCITATE Diet recommendation:regular Diet as tolerated Brief/Interim Summary:77 y.o.malewith medical history significant ofParkinson's disease, dysphasia,nonverbal status,essential tremor, BPH, stroke, GERD, hypothyroidism, depression, anxiety,recurrentaspiration pneumonia, who presents with fever.  Pt is nonverbal and cannot walk due to rigidity. Today pt was found to have fever in facility.They did chest x-ray, which showed pneumonia. Patient was given 1 dose of IM Rocephin injection andtransferred to ED for further evaluation and treatment.Due tononverbal status, it is difficult to get any history from the patient directly,therefore, most of the history is obtained by discussing the case with ED physician, per EMS report, and with the nursing staff. Pt seems to have mild cough, but no acuterespiratory distress. No active nausea, vomiting, diarrhea notated. When I saw pt in ED, he is alert, he shakes her head when asked if he has any chest pain. He seems to havecentral abdominal tenderness on physical examination. Per DEP which contacted SNF, pt'smental status is at his baseline.  ED Course:pt was found to haveWBC 19.2, lactic acid of 1.38, sodium 148, creatinine 1.07, temperature 102.2, tachycardia, tachypnea, oxygen saturation 95% on 2 L nasal cannula oxygen. Chest x-ray showed infiltration in LML and LLL.    Discharge Diagnoses:  Principal Problem:   Sepsis (HCC) Active Problems:   Hypothyroidism   Essential hypertension, benign   BPH (benign prostatic hyperplasia)   Aspiration pneumonia (HCC)   HCAP  (healthcare-associated pneumonia)   Dysphagia   Abdominal tenderness  Healthcare associated pneumonia versus aspiration pneumonia patient was admitted with sepsis with fever leukocytosis tachycardia and tachypnea.  He was initially treated with vancomycin and Zosyn.  He was seen by speech therapy and was found to have severe oropharyngeal dysphagia and aspiration risk.  Palliative care was consulted.  Discussed with patient's son Perlie GoldRussell who is his medical POA.  Patient and the son wanted just to be kept comfortable.  Patient wanted to drink clear liquids which he usually enjoys.  They have come to a conclusion that it is time to transfer care to hospice services.  In addition to this patient has history of Parkinson's disease with severe dysphagia.  Patient is being transferred or discharged to residential hospice today.  Discharge Instructions  Discharge Instructions    Diet - low sodium heart healthy   Complete by:  As directed    Increase activity slowly   Complete by:  As directed      Allergies as of 11/08/2017      Reactions   Demerol [meperidine] Other (See Comments)   Reaction:  Unknown    Lactose Intolerance (gi) Other (See Comments)   Reaction:  Unknown    Nickel Other (See Comments)   Reaction:  Unknown    Oxycodone Other (See Comments)   Reaction:  Unknown       Medication List    STOP taking these medications   albuterol (2.5 MG/3ML) 0.083% nebulizer solution Commonly known as:  PROVENTIL   buPROPion 200 MG 12 hr tablet Commonly known as:  WELLBUTRIN SR   carbidopa-levodopa 25-100 MG tablet Commonly known as:  SINEMET IR   cefTRIAXone 1 g/4 mLs in lidocaine (with preservative) injection   clonazePAM 0.5 MG tablet Commonly known as:  KLONOPIN   DIETARY MANAGEMENT PRODUCT PO  levothyroxine 150 MCG tablet Commonly known as:  SYNTHROID, LEVOTHROID   Melatonin 3 MG Tabs   NUEDEXTA 20-10 MG Caps Generic drug:  Dextromethorphan-quiNIDine   senna 8.6 MG  Tabs tablet Commonly known as:  SENOKOT   tamsulosin 0.4 MG Caps capsule Commonly known as:  FLOMAX   Vitamin D 2000 units Caps       Allergies  Allergen Reactions  . Demerol [Meperidine] Other (See Comments)    Reaction:  Unknown   . Lactose Intolerance (Gi) Other (See Comments)    Reaction:  Unknown   . Nickel Other (See Comments)    Reaction:  Unknown   . Oxycodone Other (See Comments)    Reaction:  Unknown     Consultations: palliative  Procedures/Studies: Ct Abdomen Pelvis Wo Contrast  Result Date: 11/03/2017 CLINICAL DATA:  Abdominal pain, fever EXAM: CT ABDOMEN AND PELVIS WITHOUT CONTRAST TECHNIQUE: Multidetector CT imaging of the abdomen and pelvis was performed following the standard protocol without IV contrast. COMPARISON:  Chest x-ray earlier today FINDINGS: Lower chest: Consolidation noted in the left lower lobe and to a lesser extent the right lower lobe compatible with pneumonia. Heart is normal size. No effusions. Hepatobiliary: No focal hepatic abnormality. Gallbladder unremarkable. Pancreas: No focal abnormality or ductal dilatation. Spleen: No focal abnormality.  Normal size. Adrenals/Urinary Tract: Numerous bilateral renal stones with mild hydronephrosis bilaterally. Multiple stones layering within the dilated renal pelves bilaterally. Largest stone on the left is in the midpole measuring 10 mm. Largest stone on the right is layering in the renal pelvis measuring 11 mm. There are several layering stones within the urinary bladder is well. No visible urinary at no visible ureteral stones. Adrenal glands unremarkable. Stomach/Bowel: Large stool burden in the rectosigmoid colon concerning for fecal impaction. Moderate stool burden throughout the remainder of the colon. Stomach and small bowel decompressed, unremarkable. No diverticulosis or diverticulitis. Vascular/Lymphatic: Aortic atherosclerosis. No enlarged abdominal or pelvic lymph nodes. Reproductive: No visible  focal abnormality. Other: No free fluid or free air. Musculoskeletal: No acute bony abnormality. IMPRESSION: Dense consolidation in the left lower lobe with early infiltrate at the right lung base. Findings compatible with pneumonia. Numerous bilateral renal stones. Mild hydronephrosis bilaterally without visible ureteral stones. There are layering stones within the renal pelves bilaterally. Multiple small dependent stones in the bladder. Large stool burden in the colon, particularly rectosigmoid colon concerning for fecal impaction. Aortoiliac atherosclerosis. Electronically Signed   By: Charlett NoseKevin  Dover M.D.   On: 11/03/2017 20:49   Dg Chest Port 1 View  Result Date: 11/03/2017 CLINICAL DATA:  Fever EXAM: PORTABLE CHEST 1 VIEW COMPARISON:  08/29/2017 FINDINGS: Vagal stimulator noted on the left, unchanged. Heart is normal size. Airspace disease noted in the left mid and lower lung compatible with pneumonia. Right lung clear. No effusions or acute bony abnormality. IMPRESSION: Left mid and lower lung airspace disease compatible with pneumonia. Electronically Signed   By: Charlett NoseKevin  Dover M.D.   On: 11/03/2017 17:48  (Echo, Carotid, EGD, Colonoscopy, ERCP)    Subjective: In bed watching TV appears comfortable.  Discharge Exam: Vitals:   11/07/17 2103 11/08/17 0429  BP: (!) 117/58 (!) 113/49  Pulse: 80 64  Resp: 20 18  Temp: 98.4 F (36.9 C) 98 F (36.7 C)  SpO2: 97% 98%   Vitals:   11/07/17 1856 11/07/17 2008 11/07/17 2103 11/08/17 0429  BP:   (!) 117/58 (!) 113/49  Pulse:   80 64  Resp:   20 18  Temp: 100.3 F (  37.9 C)  98.4 F (36.9 C) 98 F (36.7 C)  TempSrc: Oral  Oral Oral  SpO2:  97% 97% 98%  Weight:      Height:        General: Pt is alert, awake, not in acute distress Cardiovascular: RRR, S1/S2 +, no rubs, no gallops Respiratory: Coarse breath sounds bilaterally, no wheezing, no rhonchi Abdominal: Soft, NT, ND, bowel sounds + Extremities: no edema, no cyanosis    The  results of significant diagnostics from this hospitalization (including imaging, microbiology, ancillary and laboratory) are listed below for reference.     Microbiology: Recent Results (from the past 240 hour(s))  Blood Culture (routine x 2)     Status: None (Preliminary result)   Collection Time: 11/03/17  5:30 PM  Result Value Ref Range Status   Specimen Description   Final    BLOOD BLOOD LEFT FOREARM Performed at [facility], [address] [place], [place] [address]    Special Requests   Final    BOTTLES DRAWN AEROBIC AND ANAEROBIC Blood Culture results may not be optimal due to an inadequate volume of blood received in culture bottles Performed at [facility], [address], [place], [place] [address]    Culture   Final    NO GROWTH 4 DAYS Performed at [facility], [address] [place], [place] [address]    Report Status PENDING  Incomplete  Blood Culture (routine x 2)     Status: Abnormal   Collection Time: 11/03/17  6:12 PM  Result Value Ref Range Status   Specimen Description   Final    BLOOD BLOOD RIGHT FOREARM Performed at [facility], [address] [place], [place] [address]    Special Requests   Final    BOTTLES DRAWN AEROBIC AND ANAEROBIC Blood Culture adequate volume Performed at [facility], [address], [place], [place] [address]    Culture  Setup Time   Final    GRAM POSITIVE COCCI AEROBIC BOTTLE ONLY CRITICAL RESULT CALLED TO, READ BACK BY AND VERIFIED WITH: PHARMD L POINDEXTER [id number] 1227 MLM    Culture (A)  Final    STAPHYLOCOCCUS SPECIES (COAGULASE NEGATIVE) THE SIGNIFICANCE OF ISOLATING THIS ORGANISM FROM A SINGLE SET OF BLOOD CULTURES WHEN MULTIPLE SETS ARE DRAWN IS UNCERTAIN. PLEASE NOTIFY THE MICROBIOLOGY DEPARTMENT WITHIN ONE WEEK IF SPECIATION AND SENSITIVITIES ARE REQUIRED. Performed at [facility], [address], [place], [place] [address]     Report Status 11/07/2017 FINAL  Final  Blood Culture ID Panel (Reflexed)     Status: Abnormal   Collection Time: 11/03/17  6:12 PM  Result Value Ref Range Status   Enterococcus species NOT DETECTED NOT DETECTED Final   Listeria monocytogenes NOT DETECTED NOT DETECTED Final   Staphylococcus species DETECTED (A) NOT DETECTED Final    Comment: Methicillin (oxacillin) resistant coagulase negative staphylococcus. Possible blood culture contaminant (unless isolated from more than one blood culture draw or clinical case suggests pathogenicity). No antibiotic treatment is indicated for blood  culture contaminants. CRITICAL RESULT CALLED TO, READ BACK BY AND VERIFIED WITH: PHARMD L POINDEXTER [id number] 1227 MLM    Staphylococcus aureus NOT DETECTED NOT DETECTED Final   Methicillin resistance DETECTED (A) NOT DETECTED Final    Comment: CRITICAL RESULT CALLED TO, READ BACK BY AND VERIFIED WITH: PHARMD L POINDEXTER [id number] 1227 MLM    Streptococcus species NOT DETECTED NOT DETECTED Final   Streptococcus agalactiae  NOT DETECTED NOT DETECTED Final   Streptococcus pneumoniae NOT DETECTED NOT DETECTED Final   Streptococcus pyogenes NOT DETECTED NOT DETECTED Final   Acinetobacter baumannii NOT DETECTED NOT DETECTED Final   Enterobacteriaceae species NOT DETECTED NOT DETECTED Final   Enterobacter cloacae complex NOT DETECTED NOT DETECTED Final   Escherichia coli NOT DETECTED NOT DETECTED Final   Klebsiella oxytoca NOT DETECTED NOT DETECTED Final   Klebsiella pneumoniae NOT DETECTED NOT DETECTED Final   Proteus species NOT DETECTED NOT DETECTED Final   Serratia marcescens NOT DETECTED NOT DETECTED Final   Haemophilus influenzae NOT DETECTED NOT DETECTED Final   Neisseria meningitidis NOT DETECTED NOT DETECTED Final   Pseudomonas aeruginosa NOT DETECTED NOT DETECTED Final   Candida albicans NOT DETECTED NOT DETECTED Final   Candida glabrata NOT DETECTED NOT DETECTED Final   Candida krusei NOT DETECTED NOT  DETECTED Final   Candida parapsilosis NOT DETECTED NOT DETECTED Final   Candida tropicalis NOT DETECTED NOT DETECTED Final    Comment: Performed at [facility], [address] [place], [place] [address]  Urine culture     Status: None   Collection Time: 11/04/17  9:53 AM  Result Value Ref Range Status   Specimen Description   Final    URINE, CATHETERIZED Performed at [facility], [address] [place], [place] [address]    Special Requests   Final    NONE Performed at [facility], [address] [place], [place] [address]    Culture   Final    NO GROWTH Performed at [facility], [address], [place], [place] [address]    Report Status 11/05/2017 FINAL  Final  MRSA PCR Screening     Status: Abnormal   Collection Time: 11/04/17  9:53 AM  Result Value Ref Range Status   MRSA by PCR POSITIVE (A) NEGATIVE Final    Comment:        The GeneXpert MRSA Assay (FDA approved for NASAL specimens only), is one component of a comprehensive MRSA colonization surveillance program. It is not intended to diagnose MRSA infection nor to guide or monitor treatment for MRSA infections. RESULT CALLED TO, READ BACK BY AND VERIFIED WITH: Ellard Artis [id number] @ 1140 BY J SCOTTON Performed at [facility], [address], [place], [place] [address]      Labs: BNP (last 3 results) No results for input(s): BNP in the last 8760 hours. Basic Metabolic Panel: Recent Labs  Lab 11/03/17 1827 11/04/17 0117 11/05/17 0423 11/06/17 0406 11/07/17 0435  NA 148*  --  145 145 142  K 3.9  --  2.7* 3.8 3.4*  CL 111  --  114* 113* 108  CO2 22  --  23 25 27   GLUCOSE 129*  --  109* 109* 120*  BUN 33*  --  23 15 10   CREATININE 1.07 0.87 0.77 0.71 0.70  CALCIUM 9.4  --  8.8* 8.5* 8.4*   Liver Function Tests: Recent Labs  Lab 11/03/17 1827  AST 15  ALT <5  ALKPHOS 85  BILITOT 1.0  PROT 7.8  ALBUMIN 3.3*   Recent  Labs  Lab 11/03/17 2159  LIPASE 24   No results for input(s): AMMONIA in the last 168 hours. CBC: Recent Labs  Lab 11/03/17 1827 11/05/17 0423 11/06/17 0406 11/07/17 0435  WBC 19.2* 14.0* 13.2* 13.6*  NEUTROABS 16.0*  --   --   --   HGB 13.7 11.1* 11.4*  11.6*  HCT 41.0 34.2* 35.1* 35.2*  MCV 94.0 95.0 94.9 93.4  PLT 226 202 200 178   Cardiac Enzymes: No results for input(s): CKTOTAL, CKMB, CKMBINDEX, TROPONINI in the last 168 hours. BNP: Invalid input(s): POCBNP CBG: Recent Labs  Lab 11/07/17 1304 11/07/17 1850  GLUCAP 83 97   D-Dimer No results for input(s): DDIMER in the last 72 hours. Hgb A1c No results for input(s): HGBA1C in the last 72 hours. Lipid Profile No results for input(s): CHOL, HDL, LDLCALC, TRIG, CHOLHDL, LDLDIRECT in the last 72 hours. Thyroid function studies No results for input(s): TSH, T4TOTAL, T3FREE, THYROIDAB in the last 72 hours.  Invalid input(s): FREET3 Anemia work up No results for input(s): VITAMINB12, FOLATE, FERRITIN, TIBC, IRON, RETICCTPCT in the last 72 hours. Urinalysis    Component Value Date/Time   COLORURINE AMBER (A) 11/04/2017 0953   APPEARANCEUR CLEAR 11/04/2017 0953   LABSPEC 1.014 11/04/2017 0953   PHURINE 7.0 11/04/2017 0953   GLUCOSEU NEGATIVE 11/04/2017 0953   HGBUR LARGE (A) 11/04/2017 0953   BILIRUBINUR NEGATIVE 11/04/2017 0953   KETONESUR 5 (A) 11/04/2017 0953   PROTEINUR 30 (A) 11/04/2017 0953   NITRITE NEGATIVE 11/04/2017 0953   LEUKOCYTESUR LARGE (A) 11/04/2017 0953   Sepsis Labs Invalid input(s): PROCALCITONIN,  WBC,  LACTICIDVEN Microbiology Recent Results (from the past 240 hour(s))  Blood Culture (routine x 2)     Status: None (Preliminary result)   Collection Time: 11/03/17  5:30 PM  Result Value Ref Range Status   Specimen Description   Final    BLOOD BLOOD LEFT FOREARM Performed at [facility], [address] [place], [place] [address]    Special Requests   Final     BOTTLES DRAWN AEROBIC AND ANAEROBIC Blood Culture results may not be optimal due to an inadequate volume of blood received in culture bottles Performed at [facility], [address], [place], [place] [address]    Culture   Final    NO GROWTH 4 DAYS Performed at [facility], [address], [place], [place] [address]    Report Status PENDING  Incomplete  Blood Culture (routine x 2)     Status: Abnormal   Collection Time: 11/03/17  6:12 PM  Result Value Ref Range Status   Specimen Description   Final    BLOOD BLOOD RIGHT FOREARM Performed at [facility], [address], [place], [place] [address]    Special Requests   Final    BOTTLES DRAWN AEROBIC AND ANAEROBIC Blood Culture adequate volume Performed at [facility], [address] [place], [place] [address]    Culture  Setup Time   Final    GRAM POSITIVE COCCI AEROBIC BOTTLE ONLY CRITICAL RESULT CALLED TO, READ BACK BY AND VERIFIED WITH: PHARMD L POINDEXTER [id number] 1227 MLM    Culture (A)  Final    STAPHYLOCOCCUS SPECIES (COAGULASE NEGATIVE) THE SIGNIFICANCE OF ISOLATING THIS ORGANISM FROM A SINGLE SET OF BLOOD CULTURES WHEN MULTIPLE SETS ARE DRAWN IS UNCERTAIN. PLEASE NOTIFY THE MICROBIOLOGY DEPARTMENT WITHIN ONE WEEK IF SPECIATION AND SENSITIVITIES ARE REQUIRED. Performed at [facility], [address] [place], [place] [address]    Report Status 11/07/2017 FINAL  Final  Blood Culture ID Panel (Reflexed)     Status: Abnormal   Collection Time: 11/03/17  6:12 PM  Result Value Ref Range Status   Enterococcus species NOT DETECTED NOT DETECTED Final   Listeria monocytogenes NOT DETECTED  NOT DETECTED Final   Staphylococcus species DETECTED (A) NOT DETECTED Final    Comment: Methicillin (oxacillin) resistant coagulase negative staphylococcus. Possible blood culture contaminant (unless isolated from more than one blood culture draw or clinical case suggests  pathogenicity). No antibiotic treatment is indicated for blood  culture contaminants. CRITICAL RESULT CALLED TO, READ BACK BY AND VERIFIED WITH: PHARMD L POINDEXTER [id number] 1227 MLM    Staphylococcus aureus NOT DETECTED NOT DETECTED Final   Methicillin resistance DETECTED (A) NOT DETECTED Final    Comment: CRITICAL RESULT CALLED TO, READ BACK BY AND VERIFIED WITH: PHARMD L POINDEXTER [id number] 1227 MLM    Streptococcus species NOT DETECTED NOT DETECTED Final   Streptococcus agalactiae NOT DETECTED NOT DETECTED Final   Streptococcus pneumoniae NOT DETECTED NOT DETECTED Final   Streptococcus pyogenes NOT DETECTED NOT DETECTED Final   Acinetobacter baumannii NOT DETECTED NOT DETECTED Final   Enterobacteriaceae species NOT DETECTED NOT DETECTED Final   Enterobacter cloacae complex NOT DETECTED NOT DETECTED Final   Escherichia coli NOT DETECTED NOT DETECTED Final   Klebsiella oxytoca NOT DETECTED NOT DETECTED Final   Klebsiella pneumoniae NOT DETECTED NOT DETECTED Final   Proteus species NOT DETECTED NOT DETECTED Final   Serratia marcescens NOT DETECTED NOT DETECTED Final   Haemophilus influenzae NOT DETECTED NOT DETECTED Final   Neisseria meningitidis NOT DETECTED NOT DETECTED Final   Pseudomonas aeruginosa NOT DETECTED NOT DETECTED Final   Candida albicans NOT DETECTED NOT DETECTED Final   Candida glabrata NOT DETECTED NOT DETECTED Final   Candida krusei NOT DETECTED NOT DETECTED Final   Candida parapsilosis NOT DETECTED NOT DETECTED Final   Candida tropicalis NOT DETECTED NOT DETECTED Final    Comment: Performed at [facility], [address], [place], [place] [address]  Urine culture     Status: None   Collection Time: 11/04/17  9:53 AM  Result Value Ref Range Status   Specimen Description   Final    URINE, CATHETERIZED Performed at [facility], [address] [place], [place] [address]    Special Requests   Final    NONE Performed at [facility], [address] [place], [place] [address]    Culture   Final    NO GROWTH Performed at [facility], [address], [place], [place] [address]    Report Status 11/05/2017 FINAL  Final  MRSA PCR Screening     Status: Abnormal   Collection Time: 11/04/17  9:53 AM  Result Value Ref Range Status   MRSA by PCR POSITIVE (A) NEGATIVE Final    Comment:        The GeneXpert MRSA Assay (FDA approved for NASAL specimens only), is one component of a comprehensive MRSA colonization surveillance program. It is not intended to diagnose MRSA infection nor to guide or monitor treatment for MRSA infections. RESULT CALLED TO, READ BACK BY AND VERIFIED WITH: Ellard Artis [id number] @ 1140 BY J SCOTTON Performed at [facility], [address], [place], [place] [address]      Time coordinating discharge: 37  minutes  SIGNED:   Alwyn Ren, MD  [facility] 11/08/2017, 8:54 AM Pager   If 7PM-7AM, please contact night-coverage [email or web address] Password [email or web address]

## 2017-11-08 NOTE — Progress Notes (Signed)
CSW consulted for residential hospice. CSW spoke with patient/patient's son at bedside regarding consult for residential hospice. Patient's son confirmed interest in residential hospice and reported that [facility]. CSW explained referral process, patient's son verbalized understanding.   Patient's RNCM made referral to [facility].   CSW will continue to follow and assist with discharge planning.  Kristopher Jennings, [place] Clinical Social Worker [facility] Cell#: [phone]

## 2017-11-08 NOTE — Progress Notes (Addendum)
PMT progress note  Patient is resting in bed, he has mild shortness of breath, cough, has a lot of secretions. He is asking for jello.   No family at bedside, call placed, unable to reach son Kristopher Jennings.   BP (!) 113/49 (BP Location: Right Arm)   Pulse 64   Temp 98 F (36.7 C) (Oral)   Resp 18   Ht 6' (1.829 m)   Wt 52.5 kg   SpO2 98%   BMI 15.70 kg/m  Labs and imaging noted SLP eval noted, appreciate their input and recommendations.   Awake, pale, weak Mildly dyspneic Coarse rhonchi S1 S2 Abdomen soft Thin extremities with muscle wasting  PPS 30%  Medications: Patient received 4 doses of 2 mg each IV Morphine Patient received 5 mg PO, 2 doses prn.   77 year old gentleman, resident of [facility], history of stroke, Parkinson's disease, recurrent aspiration pneumonia, essentially bed bound at his nursing facility due to rigidity, generalized weakness, admitted to the hospital from skilled nursing facility to hospital medicine service with fever,sepsis likely from recurrent aspiration.   Agree with comfort measures Await residential hospice transfer, as was previously discussed by PMT colleague with family and patient.   25 minutes spent Kristopher HawkingZeba Lagina Reader MD [facility] [phone]  [phone]

## 2017-11-09 DIAGNOSIS — A419 Sepsis, unspecified organism: Principal | ICD-10-CM

## 2017-11-09 MED ORDER — ONDANSETRON HCL 4 MG PO TABS: 4.0000 mg | ORAL_TABLET | Freq: Four times a day (QID) | ORAL | 0 refills | Status: AC | PRN

## 2017-11-09 MED ORDER — AMOXICILLIN-POT CLAVULANATE 875-125 MG PO TABS: 1.0000 | ORAL_TABLET | Freq: Two times a day (BID) | ORAL | 0 refills | Status: AC

## 2017-11-09 MED ORDER — LORAZEPAM 2 MG/ML PO CONC: 1.0000 mg | ORAL | 0 refills | Status: AC | PRN

## 2017-11-09 MED ORDER — GLYCOPYRROLATE 1 MG PO TABS: 1.0000 mg | ORAL_TABLET | ORAL | 0 refills | Status: AC | PRN

## 2017-11-09 MED ORDER — ACETAMINOPHEN 325 MG PO TABS: 650.0000 mg | ORAL_TABLET | Freq: Four times a day (QID) | ORAL | 1 refills | Status: AC | PRN

## 2017-11-09 MED ORDER — POLYETHYLENE GLYCOL 3350 17 G PO PACK: 17.0000 g | PACK | Freq: Every day | ORAL | 0 refills | Status: AC

## 2017-11-09 NOTE — Progress Notes (Signed)
PMT progress note  Patient is resting in bed, he is awake and alert, watching TV Patient with some what of a rally, he is more alert, he is tolerating a diet, he is asking for water, has finished his breakfast  Son now at bedside, we discussed about aspiration etiology and goals of care.      BP 129/69 (BP Location: Left Arm)   Pulse 63   Temp 98.6 F (37 C) (Oral)   Resp 18   Ht 6' (1.829 m)   Wt 52.5 kg   SpO2 96%   BMI 15.70 kg/m  Labs and imaging noted SLP eval noted, appreciate their input and recommendations.   Awake, pale, weak Alert, in no distress  Some scattered Coarse rhonchi S1 S2 Abdomen soft Thin extremities with muscle wasting  PPS 30%  Medications: Patient is on Morphine for symptom management of dyspnea and pain.   77 year old gentleman, resident of [facility], history of stroke, Parkinson's disease, recurrent aspiration pneumonia, essentially bed bound at his nursing facility due to rigidity, generalized weakness, admitted to the hospital from skilled nursing facility to hospital medicine service with fever,sepsis likely from recurrent aspiration.   Family meeting with son: 1. Son is asking for antibiotics at discharge for this episode of aspiration pneumonia, he realizes that the patient is at high risk for ongoing aspiration.  2. New MOST form to be filled out by son and he will address it with the [facility] staff, as he wishes to think through the choices listed on the MOST form. 3. [facility] to follow the patient, from now on, at [facility].  4. Son requests for the patient to be transferred to hospice home in [place], [place] when the patient needs residential hospice    35 minutes spent Kristopher HawkingZeba Khilee Hendricksen MD [facility] palliative medicine team [phone]  [phone]

## 2017-11-09 NOTE — Discharge Instructions (Signed)
1) Discharge back to [facility] with palliative care services--- patient's son to complete MOST form with limitations to treatment 2) Hospice and palliative care services to be requested upon return [facility] 3)Aspiration precautions 4) please get speech pathology consult at facility 5) please get dietitian/nutritional consult at facility 6)Diet recommendations: Dysphagia 3 (mechanical soft);Thin liquid(to honor pt's wishes) Liquids provided via: Cup;No straw;Teaspoon Medication Administration: Crushed with puree Compensations: Minimize environmental distractions;Slow rate;Small sips/bites;Follow solids with liquid(stop intake when pt coughing) Postural Changes and/or Swallow Maneuvers: Seated upright 90 degrees;Upright 30-60 min after meal  7) supplemental oxygen at 2 L/min via Nasal canula

## 2017-11-09 NOTE — Progress Notes (Signed)
Pt did not meet qualification for Hospice home. Pt will discharge back to SNF with Hospice. [facility] use [facility] for in house hospice and will make the referral.

## 2017-11-09 NOTE — Progress Notes (Signed)
CSW following to assist with discharge planning.   Patient was not approved [facility]. Patient to [facility] back to [facility] with hospice services.  CSW contacted [facility] and staff member Velna HatchetSheila confirmed patient's ability to return to SNF at dc.  Patient will [facility] back [facility]. CSW will continue to follow and assist with discharge planning.  Celso SickleKimberly Dandrea Widdowson, [place] Clinical Social Worker [facility] Cell#: [phone]

## 2017-11-09 NOTE — Progress Notes (Signed)
Attempted to call report x2 to [facility]. After transfer phone continues to ring with no answer. Will continue to try back.

## 2017-11-09 NOTE — Discharge Summary (Signed)
Kristopher Jennings, is a 77 y.o. male  DOB Jan 15, 1941  MRN [id number].  Admission date:  11/03/2017  Admitting Physician  Ivor Costa, MD  Discharge Date:  11/09/2017   Primary MD  Seward Carol, MD  Recommendations for primary care physician for things to follow:   1) Discharge back to [facility] facility with palliative care services--- patient's son to complete MOST form with limitations to treatment 2) Hospice and palliative care services to be requested upon return to [facility] facility, [place] [facility] to follow the patient, from now on, at [facility]. 3)Aspiration precautions 4) please get speech pathology consult at facility 5) please get dietitian/nutritional consult at facility 6)Diet recommendations: Dysphagia 3 (mechanical soft);Thin liquid(to honor pt's wishes) Liquids provided via: Cup;No straw;Teaspoon Medication Administration: Crushed with puree Compensations: Minimize environmental distractions;Slow rate;Small sips/bites;Follow solids with liquid(stop intake when pt coughing) Postural Changes and/or Swallow Maneuvers: Seated upright 90 degrees;Upright 30-60 min after meal   7) supplemental oxygen at 2 L/min via Nasal canula  Admission Diagnosis  Sepsis, due to unspecified organism ([place]) [A41.9] Community acquired pneumonia, unspecified laterality [J18.9]   Discharge Diagnosis  Sepsis, due to unspecified organism ([place]) [A41.9] Community acquired pneumonia, unspecified laterality [J18.9]    Principal Problem:   Sepsis ([place]) Active Problems:   Hypothyroidism   Essential hypertension, benign   BPH (benign prostatic hyperplasia)   Aspiration pneumonia ([place])   HCAP (healthcare-associated pneumonia)   Community acquired pneumonia   Dysphagia   Abdominal tenderness      Past Medical History:  Diagnosis Date  . Anxiety   . Arthritis   . Ataxia   . Cauda  equina syndrome ([place])   . Depression   . Dysphagia   . Essential Tremors   . GERD (gastroesophageal reflux disease)   . Paralysis agitans ([place]) 01/12/2013  . Parkinson disease ([place])   . Stroke ([place])   . Thyroid disease   . UTI (lower urinary tract infection)     Past Surgical History:  Procedure Laterality Date  . IMPLANTATION / PLACEMENT EPIDURAL NEUROSTIMULATOR ELECTRODES Left   . LAMINECTOMY    . SPINE SURGERY       HPI  from the history and physical done on the day of admission:   Patient coming from:  The patient is coming from [facility].  At baseline, pt is dependent for most of ADL  Chief Complaint: fever  HPI: Kristopher Jennings is a 77 y.o. male with medical history significant of Parkinson's disease, dysphasia, nonverbal status, essential tremor, BPH, stroke, GERD, hypothyroidism, depression, anxiety, recurrent aspiration pneumonia, who presents with fever.  Pt is nonverbal and cannot walk due to rigidity. Today pt was found to have fever in facility.  They did chest x-ray, which showed pneumonia.  Patient was given 1 dose of IM Rocephin injection and transferred to ED for further evaluation and treatment. Due to nonverbal status, it is difficult to get any history from the patient directly, therefore, most of the history is obtained by discussing the case  with ED physician, per EMS report, and with the nursing staff. Pt seems to have mild cough, but no acute respiratory distress.  No active nausea, vomiting, diarrhea notated. When I saw pt in ED, he is alert, he shakes her head when asked if he has any chest pain. He seems to have central abdominal tenderness on physical examination. Per DEP which contacted [facility], pt's mental status is at his baseline.  ED Course: pt was found to have WBC 19.2, lactic acid of 1.38, sodium 148, creatinine 1.07, temperature 102.2, tachycardia, tachypnea, oxygen saturation 95% on 2 L nasal cannula oxygen.  Chest x-ray showed infiltration in LML and  LLL.      Hospital Course:   Brief Summary  77 year old gentleman, resident of [facility], history of stroke, Parkinson's disease, recurrent aspiration pneumonia, essentially bed bound at his nursing facility due to rigidity, generalized weakness, admitted to the hospital from skilled nursing facility to hospital medicine service with fever,sepsis likely from recurrent aspiration.    Plan:- 1)Presumed Aspiration Pneumonia--improved, okay to discharge an additional 5 days of Augmentin along with   Bronchodilators, as it is high risk for recurrent aspiration , please note that on admission patient apparently met sepsis criteria was treated with concerns about possible HCAP  2)Social/Ethics--patient's son to complete the MOST form with limitations to treatment for return to facility, [facility] to follow the patient, from now on, at [facility].  Patient is a DNR  3)FEN/Dysphagia-speech pathology recommendations and evaluation appreciated,   Diet recommendations: Dysphagia 3 (mechanical soft);Thin liquid(to honor pt's wishes) Liquids provided via: Cup;No straw;Teaspoon Medication Administration: Crushed with puree Compensations: Minimize environmental distractions;Slow rate;Small sips/bites;Follow solids with liquid(stop intake when pt coughing) Postural Changes and/or Swallow Maneuvers: Seated upright 90 degrees;Upright 30-60 min after meal   4)BPH--- continue Flomax  5) Parkinson's disease--- movement disorder/tremors possibly Parkinson's related, continue Sinemet  6) Hypothyroidism--- continue Synthroid   Discharge Condition:   Follow UP  Contact information for after-discharge care    Kristopher Jennings .   Service:  Skilled Nursing Contact information: [place] [phone]               Consults obtained - palliative/Speech  Diet and Activity recommendation:  As advised  Discharge  Instructions    * Discharge Instructions    Call MD for:  difficulty breathing, headache or visual disturbances   Complete by:  As directed    Call MD for:  persistant dizziness or light-headedness   Complete by:  As directed    Call MD for:  persistant nausea and vomiting   Complete by:  As directed    Call MD for:  severe uncontrolled pain   Complete by:  As directed    Call MD for:  temperature >100.4   Complete by:  As directed    Diet general   Complete by:  As directed    Diet recommendations: Dysphagia 3 (mechanical soft);Thin liquid(to honor pt's wishes) Liquids provided via: Cup;No straw;Teaspoon Medication Administration: Crushed with puree Compensations: Minimize environmental distractions;Slow rate;Small sips/bites;Follow solids with liquid(stop intake when pt coughing) Postural Changes and/or Swallow Maneuvers: Seated upright 90 degrees;Upright 30-60 min after meal      Discharge instructions   Complete by:  As directed    1) Discharge back to [facility] facility with palliative care services--- patient's son to complete MOST form with limitations to treatment 2) Hospice and palliative care services to  be requested upon return to [facility] facility 3)Aspiration precautions 4) please get speech pathology consult at facility 5) please get dietitian/nutritional consult at facility 6)Diet recommendations: Dysphagia 3 (mechanical soft);Thin liquid(to honor pt's wishes) Liquids provided via: Cup;No straw;Teaspoon Medication Administration: Crushed with puree Compensations: Minimize environmental distractions;Slow rate;Small sips/bites;Follow solids with liquid(stop intake when pt coughing) Postural Changes and/or Swallow Maneuvers: Seated upright 90 degrees;Upright 30-60 min after meal      Increase activity slowly   Complete by:  As directed    Up to chair with assistance   Increase activity slowly   Complete by:  As directed          Discharge Medications     Allergies as of 11/09/2017      Reactions   Demerol [meperidine] Other (See Comments)   Reaction:  Unknown    Lactose Intolerance (gi) Other (See Comments)   Reaction:  Unknown    Nickel Other (See Comments)   Reaction:  Unknown    Oxycodone Other (See Comments)   Reaction:  Unknown       Medication List    STOP taking these medications   cefTRIAXone 1 g/4 mLs in lidocaine (with preservative) injection   clonazePAM 0.5 MG tablet Commonly known as:  KLONOPIN   Melatonin 3 MG Tabs   NUEDEXTA 20-10 MG Caps Generic drug:  Dextromethorphan-quiNIDine   senna 8.6 MG Tabs tablet Commonly known as:  SENOKOT   Vitamin D 2000 units Caps     TAKE these medications   acetaminophen 325 MG tablet Commonly known as:  TYLENOL Take 2 tablets (650 mg total) by mouth every 6 (six) hours as needed for mild pain, moderate pain or headache (or Fever >/= 101).   albuterol (2.5 MG/3ML) 0.083% nebulizer solution Commonly known as:  PROVENTIL Take 3 mLs (2.5 mg total) by nebulization 3 (three) times daily.   amoxicillin-clavulanate 875-125 MG tablet Commonly known as:  AUGMENTIN Take 1 tablet by mouth 2 (two) times daily for 5 days.   buPROPion 200 MG 12 hr tablet Commonly known as:  WELLBUTRIN SR Take 200 mg by mouth daily before breakfast.   carbidopa-levodopa 25-100 MG tablet Commonly known as:  SINEMET IR Take 2 tablets by mouth 3 (three) times daily.   DIETARY MANAGEMENT PRODUCT PO Take 1 Container by mouth 3 (three) times daily. Magic Cup   glycopyrrolate 1 MG tablet Commonly known as:  ROBINUL Take 1 tablet (1 mg total) by mouth every 4 (four) hours as needed (excessive secretions).   levothyroxine 150 MCG tablet Commonly known as:  SYNTHROID, LEVOTHROID Take 150 mcg by mouth daily before breakfast.   LORazepam 2 MG/ML concentrated solution Commonly known as:  ATIVAN Place 0.5 mLs (1 mg total) under the tongue every 4 (four) hours as needed for  anxiety.   ondansetron 4 MG tablet Commonly known as:  ZOFRAN Take 1 tablet (4 mg total) by mouth every 6 (six) hours as needed for nausea.   polyethylene glycol packet Commonly known as:  MIRALAX / GLYCOLAX Take 17 g by mouth daily.   tamsulosin 0.4 MG Caps capsule Commonly known as:  FLOMAX Take 0.8 mg by mouth at bedtime.       Major procedures and Radiology Reports - PLEASE review detailed and final reports for all details, in brief -   Micro Results    Recent Results (from the past 240 hour(s))  Blood Culture (routine x 2)     Status: None   Collection Time:  11/03/17  5:30 PM  Result Value Ref Range Status   Specimen Description   Final    BLOOD BLOOD LEFT FOREARM Performed at [facility] [address] [place], [place] [address]    Special Requests   Final    BOTTLES DRAWN AEROBIC AND ANAEROBIC Blood Culture results may not be optimal due to an inadequate volume of blood received in culture bottles Performed at [facility] [address], [place], [place] [address]    Culture   Final    NO GROWTH 5 DAYS Performed at [facility], [place] [address] [place], [place] [address]    Report Status 11/08/2017 FINAL  Final  Blood Culture (routine x 2)     Status: Abnormal   Collection Time: 11/03/17  6:12 PM  Result Value Ref Range Status   Specimen Description   Final    BLOOD BLOOD RIGHT FOREARM Performed at [facility] [address] [place], [place] [address]    Special Requests   Final    BOTTLES DRAWN AEROBIC AND ANAEROBIC Blood Culture adequate volume Performed at [facility] [address] [place], [place] [address]    Culture  Setup Time   Final    GRAM POSITIVE COCCI AEROBIC BOTTLE ONLY CRITICAL RESULT CALLED TO, READ BACK BY AND VERIFIED WITH: PHARMD L POINDEXTER [id number] MLM    Culture (A)  Final    STAPHYLOCOCCUS SPECIES (COAGULASE NEGATIVE) THE SIGNIFICANCE  OF ISOLATING THIS ORGANISM FROM A SINGLE SET OF BLOOD CULTURES WHEN MULTIPLE SETS ARE DRAWN IS UNCERTAIN. PLEASE NOTIFY THE MICROBIOLOGY DEPARTMENT WITHIN ONE WEEK IF SPECIATION AND SENSITIVITIES ARE REQUIRED. Performed at [facility], [place] [address] [place], [place] [address]    Report Status 11/07/2017 FINAL  Final  Blood Culture ID Panel (Reflexed)     Status: Abnormal   Collection Time: 11/03/17  6:12 PM  Result Value Ref Range Status   Enterococcus species NOT DETECTED NOT DETECTED Final   Listeria monocytogenes NOT DETECTED NOT DETECTED Final   Staphylococcus species DETECTED (A) NOT DETECTED Final    Comment: Methicillin (oxacillin) resistant coagulase negative staphylococcus. Possible blood culture contaminant (unless isolated from more than one blood culture draw or clinical case suggests pathogenicity). No antibiotic treatment is indicated for blood  culture contaminants. CRITICAL RESULT CALLED TO, READ BACK BY AND VERIFIED WITH: PHARMD L POINDEXTER [id number] MLM    Staphylococcus aureus NOT DETECTED NOT DETECTED Final   Methicillin resistance DETECTED (A) NOT DETECTED Final    Comment: CRITICAL RESULT CALLED TO, READ BACK BY AND VERIFIED WITH: PHARMD L POINDEXTER [id number] MLM    Streptococcus species NOT DETECTED NOT DETECTED Final   Streptococcus agalactiae NOT DETECTED NOT DETECTED Final   Streptococcus pneumoniae NOT DETECTED NOT DETECTED Final   Streptococcus pyogenes NOT DETECTED NOT DETECTED Final   Acinetobacter baumannii NOT DETECTED NOT DETECTED Final   Enterobacteriaceae species NOT DETECTED NOT DETECTED Final   Enterobacter cloacae complex NOT DETECTED NOT DETECTED Final   Escherichia coli NOT DETECTED NOT DETECTED Final   Klebsiella oxytoca NOT DETECTED NOT DETECTED Final   Klebsiella pneumoniae NOT DETECTED NOT DETECTED Final   Proteus species NOT DETECTED NOT DETECTED Final   Serratia marcescens NOT DETECTED NOT DETECTED Final   Haemophilus  influenzae NOT DETECTED NOT DETECTED Final   Neisseria meningitidis NOT DETECTED NOT DETECTED Final   Pseudomonas aeruginosa NOT DETECTED NOT DETECTED Final   Candida albicans  NOT DETECTED NOT DETECTED Final   Candida glabrata NOT DETECTED NOT DETECTED Final   Candida krusei NOT DETECTED NOT DETECTED Final   Candida parapsilosis NOT DETECTED NOT DETECTED Final   Candida tropicalis NOT DETECTED NOT DETECTED Final    Comment: Performed at [facility], [place] [address] [place], [place] [address]  Urine culture     Status: None   Collection Time: 11/04/17  9:53 AM  Result Value Ref Range Status   Specimen Description   Final    URINE, CATHETERIZED Performed at [facility] [address] [place], [place] [address]    Special Requests   Final    NONE Performed at [facility], [place] [address], [place], [place] [address]    Culture   Final    NO GROWTH Performed at [facility], [place] [address] [place], [place] [address]    Report Status 11/05/2017 FINAL  Final  MRSA PCR Screening     Status: Abnormal   Collection Time: 11/04/17  9:53 AM  Result Value Ref Range Status   MRSA by PCR POSITIVE (A) NEGATIVE Final    Comment:        The GeneXpert MRSA Assay (FDA approved for NASAL specimens only), is one component of a comprehensive MRSA colonization surveillance program. It is not intended to diagnose MRSA infection nor to guide or monitor treatment for MRSA infections. RESULT CALLED TO, READ BACK BY AND VERIFIED WITH: Cyndia Diver [id number] @ [place] Performed at [facility] [address] [place], [place] [address]     Today   Subjective    Hayes Czaja today has no new concerns, had a good breakfast today patient's son is at bedside, no fevers  Patient has been seen and examined prior to discharge   Objective   Blood pressure 129/69, pulse 63, temperature 98.6 F (37 C),  temperature source Oral, resp. rate 18, height 6' (1.829 m), weight 52.5 kg, SpO2 96 %.   Intake/Output Summary (Last 24 hours) at 11/09/2017 1339 Last data filed at 11/09/2017 0600 Gross per 24 hour  Intake 2149.51 ml  Output 2700 ml  Net -550.49 ml    Exam Gen:- Awake Alert, cachectic appearing  HEENT:- [place].AT, No sclera icterus Neck-Supple Neck,No JVD,.  Nose - [place] 2 L/min Lungs-diminished in bases without wheezing  CV- S1, S2 normal Abd-  +ve B.Sounds, Abd Soft, No tenderness,    Extremity/Skin:- No  edema, good pulses Psych-affect is appropriate, oriented x3 Neuro-no new focal deficits, no tremors   Data Review   CBC w Diff:  Lab Results  Component Value Date   WBC 13.6 (H) 11/07/2017   HGB 11.6 (L) 11/07/2017   HCT 35.2 (L) 11/07/2017   PLT 178 11/07/2017   LYMPHOPCT 6 11/03/2017   MONOPCT 11 11/03/2017   EOSPCT 0 11/03/2017   BASOPCT 0 11/03/2017    CMP:  Lab Results  Component Value Date   NA 142 11/07/2017   K 3.4 (L) 11/07/2017   CL 108 11/07/2017   CO2 27 11/07/2017   BUN 10 11/07/2017   CREATININE 0.70 11/07/2017   PROT 7.8 11/03/2017   ALBUMIN 3.3 (L) 11/03/2017   BILITOT 1.0 11/03/2017   ALKPHOS 85 11/03/2017   AST 15 11/03/2017   ALT <5 11/03/2017  .   Total Discharge time is about 33 minutes  Roxan Hockey M.D on 11/09/2017 at 1:39 PM   Go to [email or web address] -  password [email or web address] for contact info  [facility] - Office  [phone]

## 2017-11-09 NOTE — Clinical Social Work Placement (Signed)
Patient returning [facility]. Facility aware of patient's discharge and confirmed patient's ability to return. PTAR contacted, patient's family notified. Patient's RN can call report to [phone], packet complete. CSW signing off, not other needs identified at this time.  CLINICAL SOCIAL WORK PLACEMENT  NOTE  Date:  11/09/2017  Patient Details  Name: Kristopher [facility] MRN: [id number] Date of Birth: 1940/07/17  Clinical Social Work is seeking post-discharge placement for this patient at the Skilled  Nursing Facility level of care (*CSW will initial, date and re-position this form in  chart as items are completed):  Yes   Patient/family provided with [place] Clinical Social Work Department's list of facilities offering this level of care within the geographic area requested by the patient (or if unable, by the patient's family).  Yes   Patient/family informed of their freedom to choose among providers that offer the needed level of care, that participate in Medicare, Medicaid or managed care program needed by the patient, have an available bed and are willing to accept the patient.  Yes   Patient/family informed of 's ownership interest in [facility] and [facility], as well as of the fact that they are under no obligation to receive care at these facilities.  PASRR submitted to EDS on       PASRR number received on       Existing PASRR number confirmed on 11/09/17     FL2 transmitted to all facilities in geographic area requested by pt/family on 11/09/17     FL2 transmitted to all facilities within larger geographic area on       Patient informed that his/her managed care company has contracts with or will negotiate with certain facilities, including the following:        Yes   Patient/family informed of bed offers received.  [facility]     Physician recommends and patient chooses bed at      Patient to be [facility] 11[facility].  Patient to be transferred to facility by PTAR     Patient family notified on 11/09/17 of transfer.  Name of family member notified:  Annett GulaRuss Whitmill (son)     PHYSICIAN       Additional Comment:    _______________________________________________ Antionette PolesKimberly L Lamichael [place], LCSW 11/09/2017, 2:31 PM

## 2017-11-09 NOTE — [place] FL2 (Signed)
[place] MEDICAID [place]2 LEVEL OF CARE SCREENING TOOL     IDENTIFICATION  Patient Name: Kristopher MerinoRussell Jennings Birthdate: 13-Dec-1940 Sex: male Admission Date (Current Location): 11/03/2017  [facility] [place]   Facility and Address:  [facility],  [address], [place] [id number]      Provider Number: [id number]  Attending Physician Name and Address:  Shon HaleEmokpae, Courage, MD  Relative Name and Phone Number:       Current Level of Care: Hospital Recommended Level of Care: Skilled Nursing Facility Prior Approval Number:    Date Approved/Denied:   PASRR Number: [id number]  Discharge Plan: SNF    Current Diagnoses: Patient Active Problem List   Diagnosis Date Noted  . Sepsis (HCC) 11/03/2017  . Dysphagia 11/03/2017  . Abdominal tenderness 11/03/2017  . H/O: stroke 08/29/2017  . Debility 08/29/2017  . Community acquired pneumonia 08/29/2017  . Protein-calorie malnutrition, severe 07/23/2016  . HCAP (healthcare-associated pneumonia) 07/21/2016  . Encounter for palliative care   . Goals of care, counseling/discussion   . Pressure injury of skin 03/20/2016  . Lobar pneumonia (HCC) 03/19/2016  . Acute encephalopathy 03/19/2016  . Aspiration pneumonia (HCC) 03/19/2016  . BPH (benign prostatic hyperplasia) 01/01/2014  . Thyroid activity decreased 10/04/2013  . Fever, unspecified 10/03/2013  . Unspecified constipation 10/02/2013  . Other specified disease of white blood cells 10/02/2013  . Cough 05/03/2013  . Urinary tract infection, site not specified 02/26/2013  . Urinary hesitancy 02/19/2013  . Parkinson's disease (HCC) 01/12/2013  . Essential hypertension, benign 01/08/2013  . Hypertrophy of prostate without urinary obstruction and other lower urinary tract symptoms (LUTS) 01/08/2013  . Anxiety state, unspecified 01/08/2013  . Benign essential tremor 12/13/2012  . H/O urinary retention 12/13/2012  . Hypothyroidism 12/13/2012  . Insomnia  12/13/2012    Orientation RESPIRATION BLADDER Height & Weight     Self, Situation, Place  O2 Incontinent Weight: 115 lb 11.9 oz (52.5 kg) Height:  6' (182.9 cm)  BEHAVIORAL SYMPTOMS/MOOD NEUROLOGICAL BOWEL NUTRITION STATUS      Incontinent Diet(Dysphagia 3)  AMBULATORY STATUS COMMUNICATION OF NEEDS Skin   Total Care Verbally Normal                       Personal Care Assistance Level of Assistance  Bathing, Feeding, Dressing Bathing Assistance: Maximum assistance Feeding assistance: Limited assistance Dressing Assistance: Maximum assistance     Functional Limitations Info  Hearing, Sight, Speech Sight Info: Adequate Hearing Info: Adequate Speech Info: Impaired    SPECIAL CARE FACTORS FREQUENCY                       Contractures      Additional Factors Info  Code Status, Allergies Code Status Info: DNR Allergies Info: Demerol Meperidine;Lactose Intolerance (gi);Nickel;Oxycodone           Current Medications (11/09/2017):  This is the current hospital active medication list Current Facility-Administered Medications  Medication Dose Route Frequency Provider Last Rate Last Dose  . 0.45 % sodium chloride infusion   Intravenous Continuous Alwyn RenMathews, Elizabeth G, MD 75 mL/hr at 11/09/17 [id number]    . acetaminophen (TYLENOL) tablet 650 mg  650 mg Oral Q6H PRN Lorretta HarpNiu, Xilin, MD   650 mg at 11/05/17 0444   Or  . acetaminophen (TYLENOL) suppository 650 mg  650 mg Rectal Q6H PRN Lorretta HarpNiu, Xilin, MD   650 mg at 11/04/17 1824  . albuterol (PROVENTIL) (2.5 MG/3ML) 0.083% nebulizer solution 2.5  mg  2.5 mg Nebulization TID Alwyn RenMathews, Elizabeth G, MD   2.5 mg at 11/08/17 1631  . bisacodyl (DULCOLAX) suppository 10 mg  10 mg Rectal Daily PRN Romie MinusFreeman, Gene, MD      . glycopyrrolate (ROBINUL) tablet 1 mg  1 mg Oral Q4H PRN Romie MinusFreeman, Gene, MD       Or  . glycopyrrolate (ROBINUL) injection 0.2 mg  0.2 mg Subcutaneous Q4H PRN Romie MinusFreeman, Gene, MD       Or  . glycopyrrolate (ROBINUL) injection  0.2 mg  0.2 mg Intravenous Q4H PRN Neale BurlyFreeman, Gene, MD      . haloperidol (HALDOL) tablet 0.5 mg  0.5 mg Oral Q4H PRN Romie MinusFreeman, Gene, MD       Or  . haloperidol (HALDOL) 2 MG/ML solution 0.5 mg  0.5 mg Sublingual Q4H PRN Romie MinusFreeman, Gene, MD       Or  . haloperidol lactate (HALDOL) injection 0.5 mg  0.5 mg Intravenous Q4H PRN Neale BurlyFreeman, Gene, MD      . levalbuterol Pauline Aus(XOPENEX) nebulizer solution 1.25 mg  1.25 mg Nebulization Q6H PRN Lorretta HarpNiu, Xilin, MD      . LORazepam (ATIVAN) tablet 1 mg  1 mg Oral Q4H PRN Romie MinusFreeman, Gene, MD       Or  . LORazepam (ATIVAN) 2 MG/ML concentrated solution 1 mg  1 mg Sublingual Q4H PRN Romie MinusFreeman, Gene, MD       Or  . LORazepam (ATIVAN) injection 1 mg  1 mg Intravenous Q4H PRN Romie MinusFreeman, Gene, MD      . morphine 10 MG/5ML solution 5 mg  5 mg Oral Q6H Neale BurlyFreeman, Gene, MD   5 mg at 11/09/17 1017  . morphine 2 MG/ML injection 1-2 mg  1-2 mg Intravenous Q2H PRN Romie MinusFreeman, Gene, MD   1 mg at 11/09/17 [id number]  . ondansetron (ZOFRAN) tablet 4 mg  4 mg Oral Q6H PRN Lorretta HarpNiu, Xilin, MD       Or  . ondansetron [facility](ZOFRAN) injection 4 mg  4 mg Intravenous Q6H PRN Lorretta HarpNiu, Xilin, MD         Discharge Medications: Please see discharge summary for a list of discharge medications.  Relevant Imaging Results:  Relevant Lab Results:   Additional Information ssn#[id number]. Needs hospice services to follow at facility  [facility] Hospital[facility]berly L Dinisha Cai, LCSW

## 2018-07-21 DEATH — deceased

## 2019-09-24 IMAGING — DX DG CHEST 1V PORT
1 series · 1 of 1 positions shown · non-contrast
Comparison: 08/29/2017

CLINICAL DATA: Fever

EXAM:
PORTABLE CHEST 1 VIEW

[chest ap]
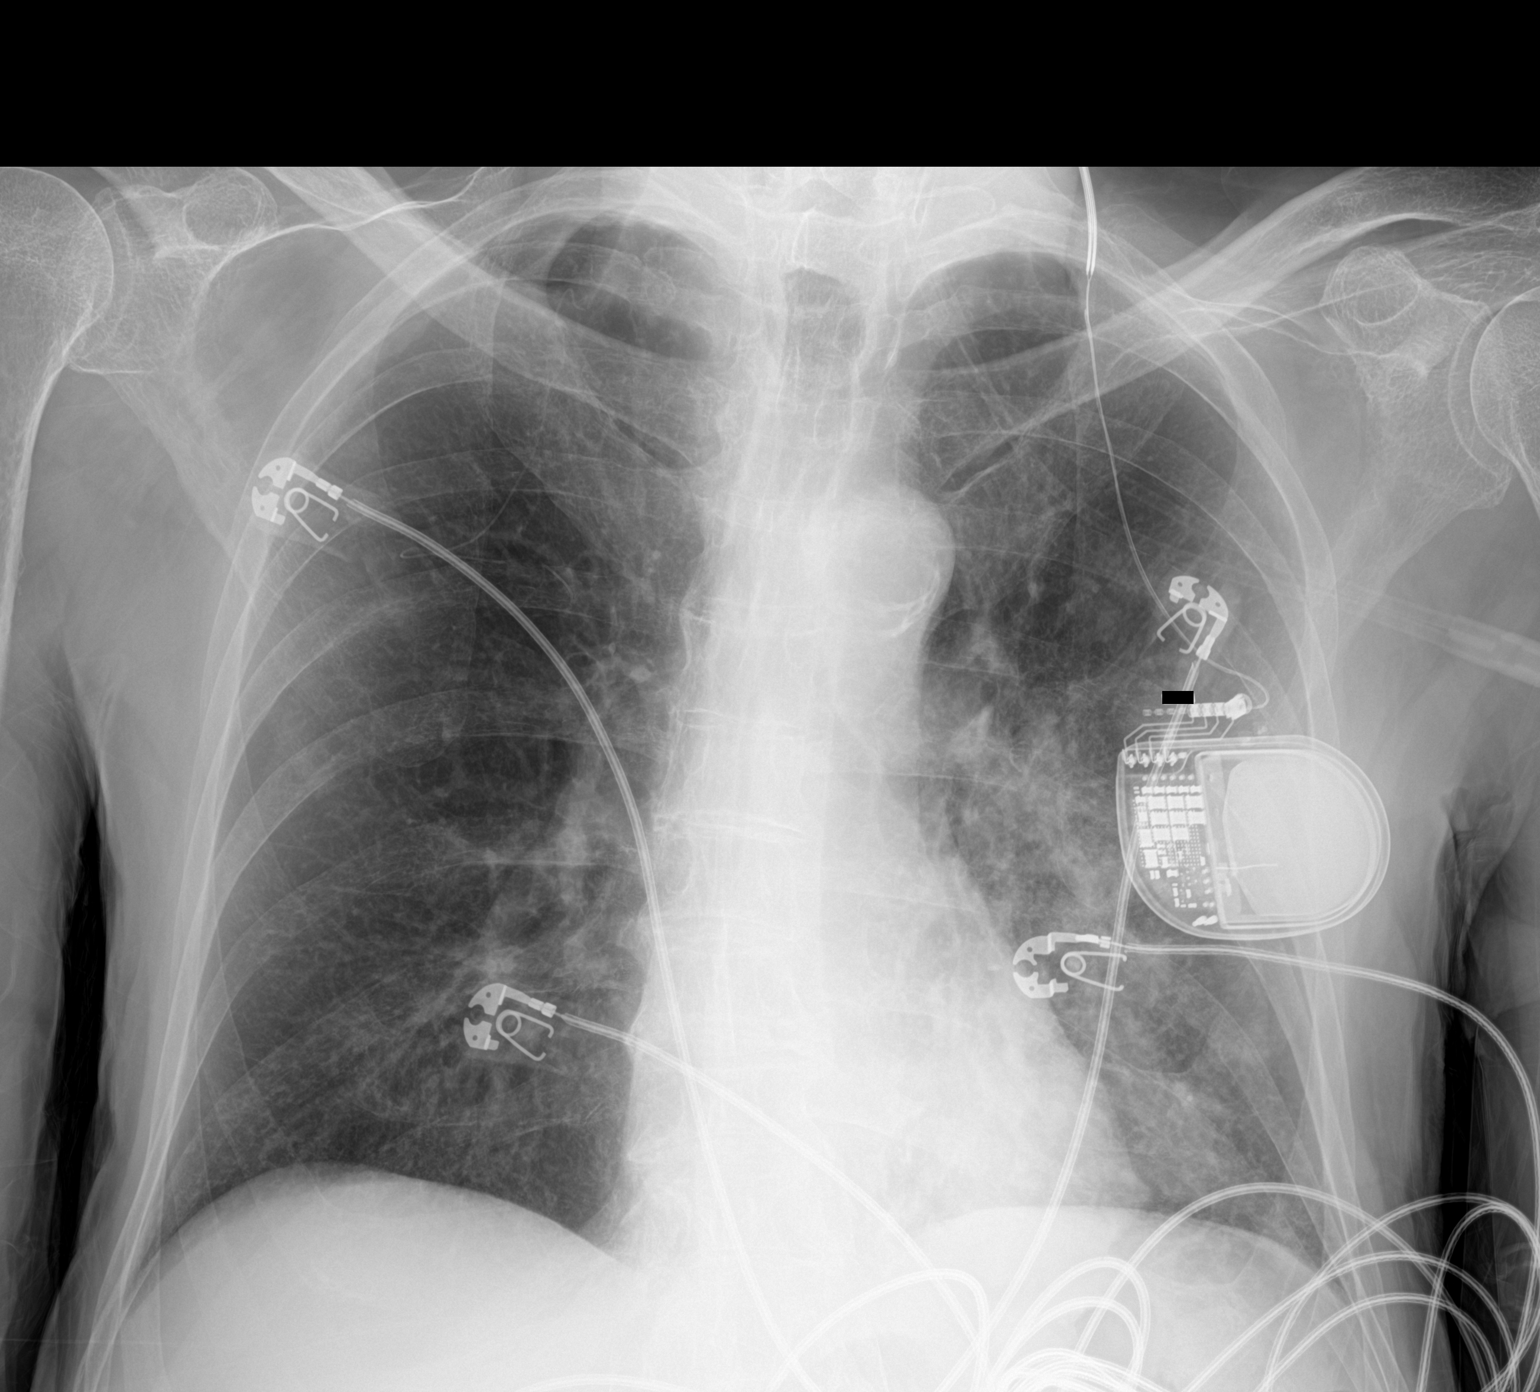

[1 of 1 positions shown; findings below may reference images not displayed]

FINDINGS: Vagal stimulator noted on the left, unchanged. Heart is normal size.
Airspace disease noted in the left mid and lower lung compatible
with pneumonia. Right lung clear. No effusions or acute bony
abnormality.
IMPRESSION: Left mid and lower lung airspace disease compatible with pneumonia.

## 2019-09-24 IMAGING — CT CT ABD-PELV W/O CM
2 of 4 series · 16 of 46 positions shown, 18 images · non-contrast
Comparison: Chest x-ray earlier today

CLINICAL DATA: Abdominal pain, fever

EXAM:
CT ABDOMEN AND PELVIS WITHOUT CONTRAST
TECHNIQUE: Multidetector CT imaging of the abdomen and pelvis was performed
following the standard protocol without IV contrast.

[Series 2: axial st · axial · 0.84mm/px · z∈[+1038,+1433]mm · 13 of 89 slices shown, 15 images]
[im 5/89  soft-tissue]
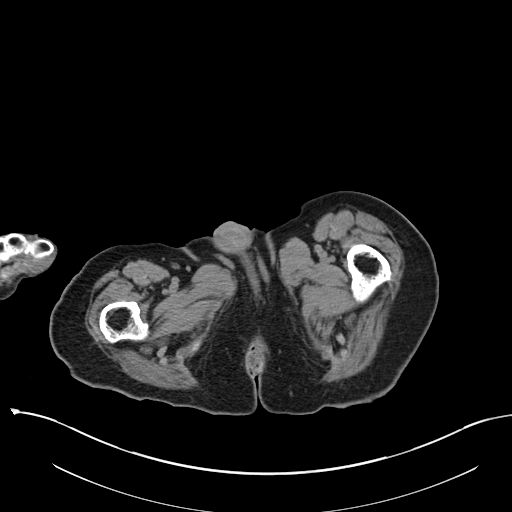
[im 5/89  bone]
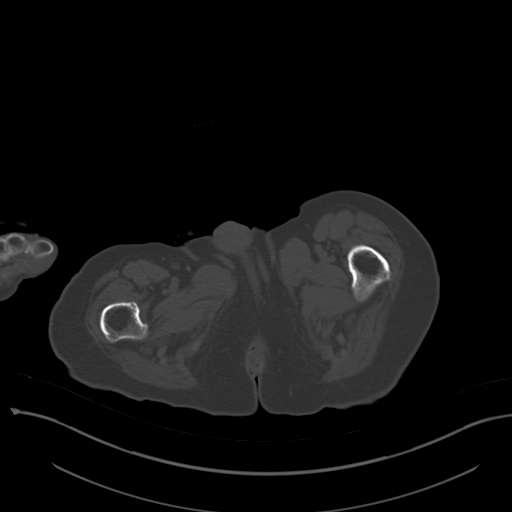
[im 14/89  soft-tissue]
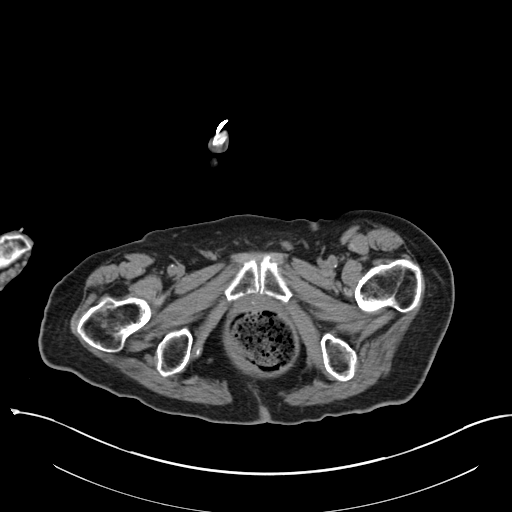
[im 18/89  soft-tissue]
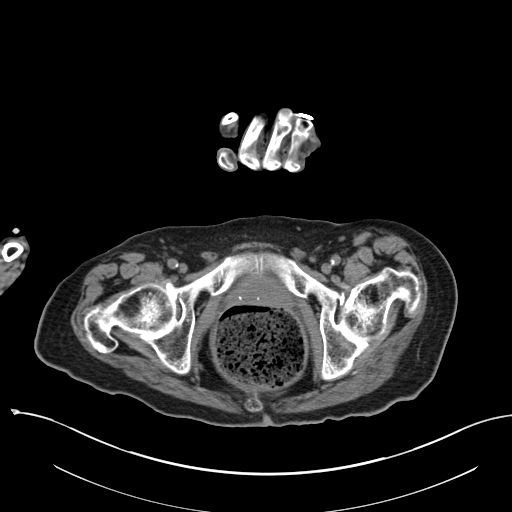
[im 27/89  soft-tissue]
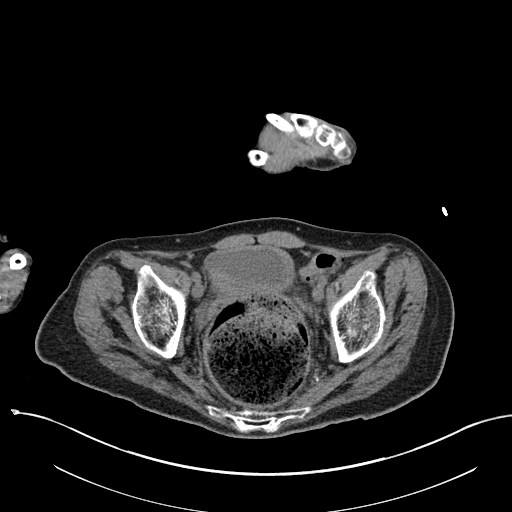
[im 31/89  soft-tissue]
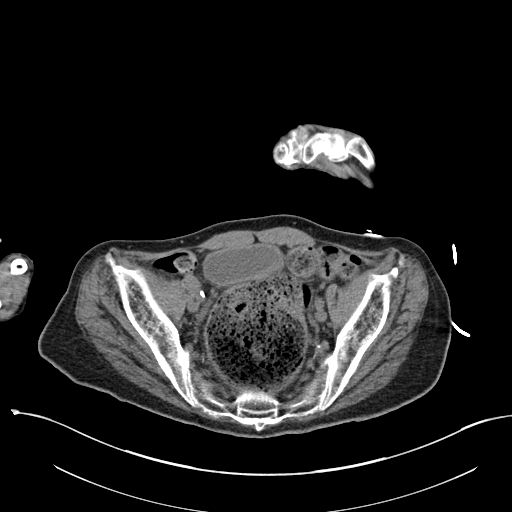
[im 40/89  soft-tissue]
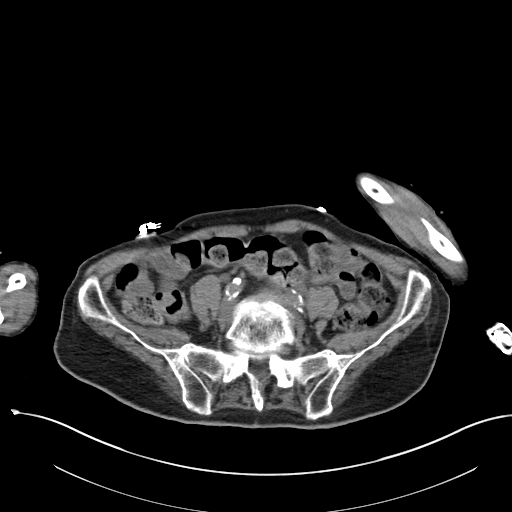
[im 45/89  soft-tissue]
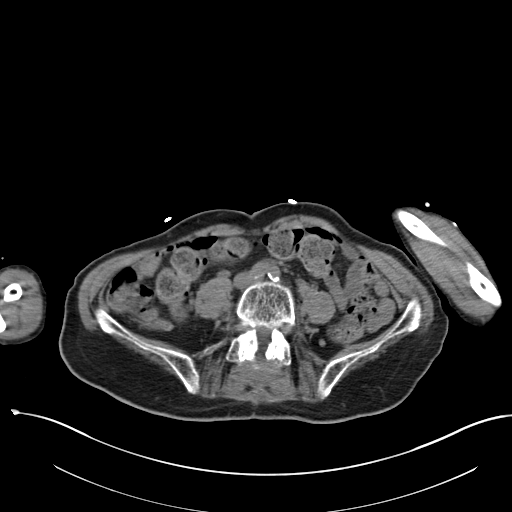
[im 49/89  soft-tissue]
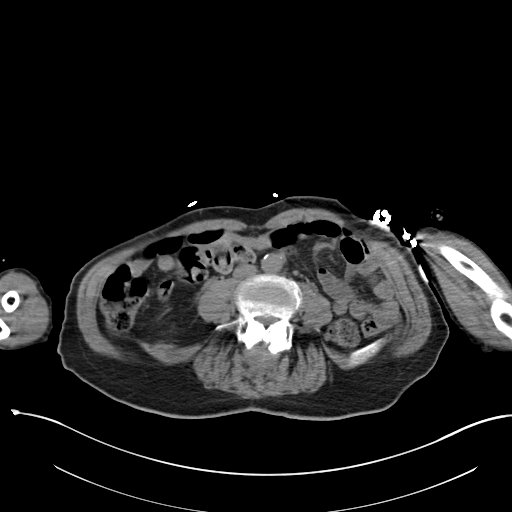
[im 58/89  soft-tissue]
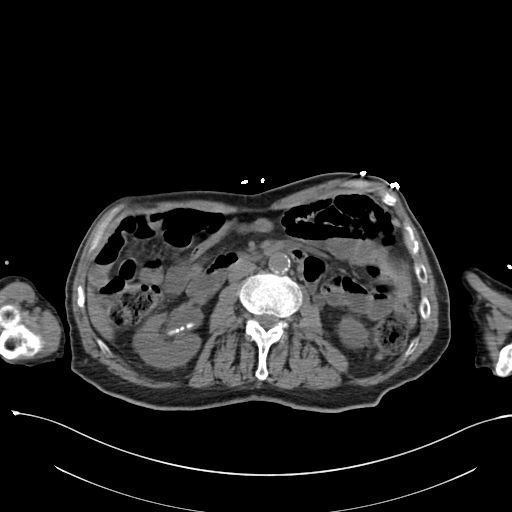
[im 58/89  bone]
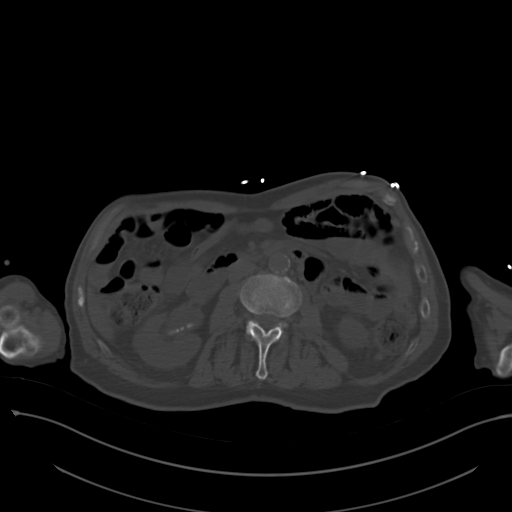
[im 62/89  soft-tissue]
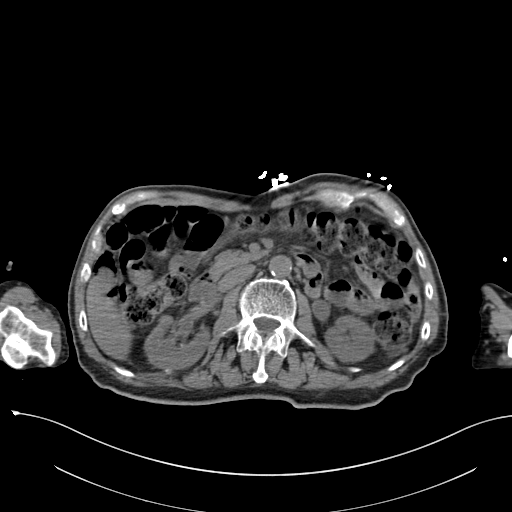
[im 71/89  soft-tissue]
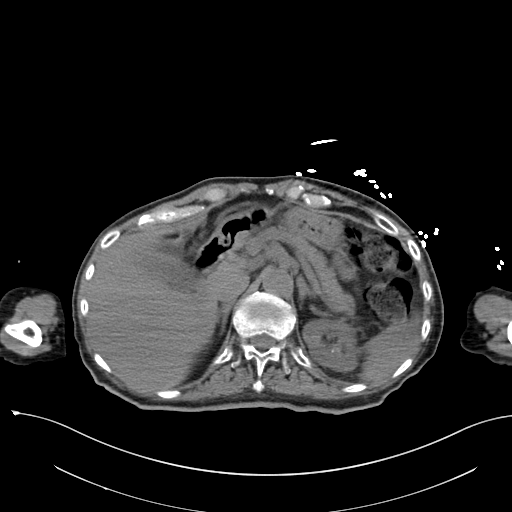
[im 75/89  soft-tissue]
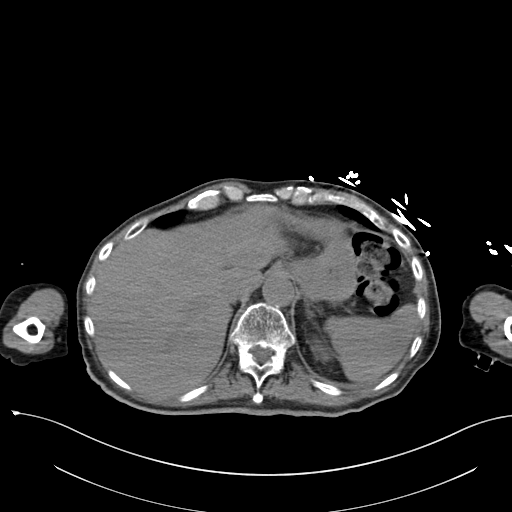
[im 84/89  soft-tissue]
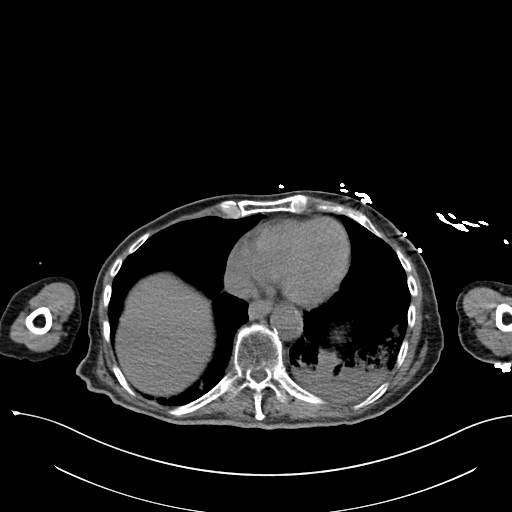

[Series 5: coronal st · coronal · 0.68mm/px · 3 of 77 slices shown]
[im 26/77  soft-tissue]
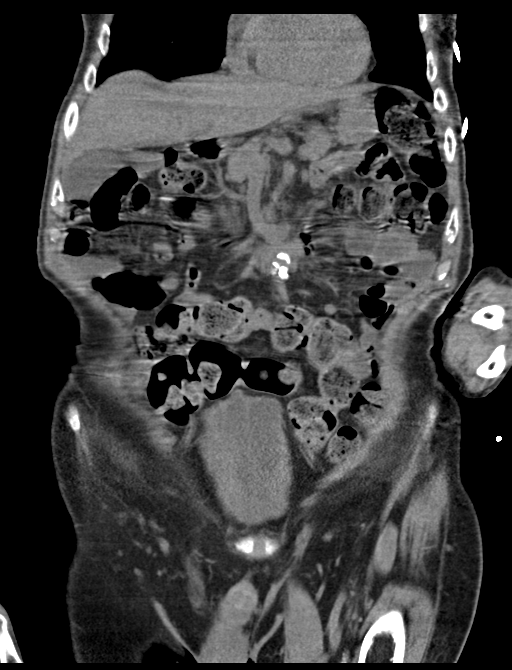
[im 34/77  soft-tissue]
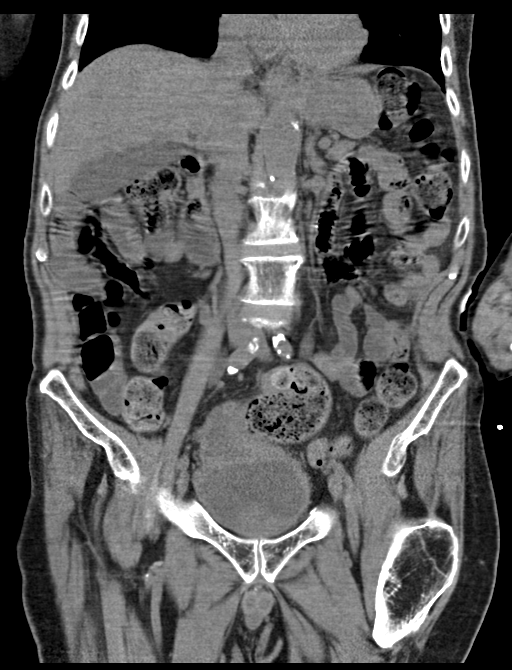
[im 43/77  soft-tissue]
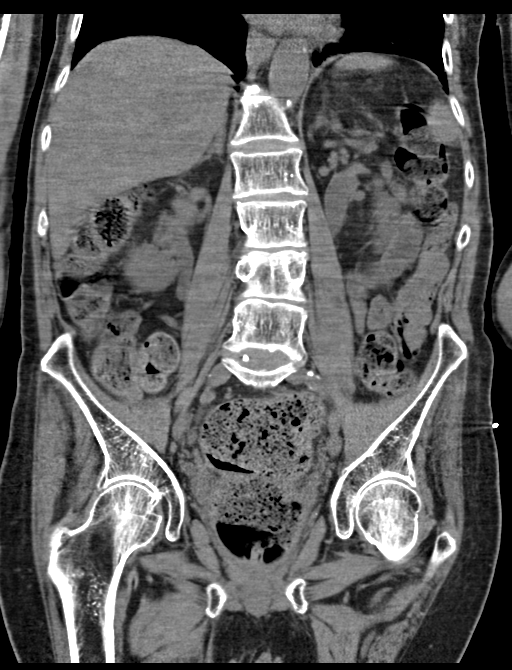

[16 of 46 positions shown; findings below may reference images not displayed]

FINDINGS: Lower chest: Consolidation noted in the left lower lobe and to a
lesser extent the right lower lobe compatible with pneumonia. Heart
is normal size. No effusions.

Hepatobiliary: No focal hepatic abnormality. Gallbladder
unremarkable.

Pancreas: No focal abnormality or ductal dilatation.

Spleen: No focal abnormality.  Normal size.

Adrenals/Urinary Tract: Numerous bilateral renal stones with mild
hydronephrosis bilaterally. Multiple stones layering within the
dilated renal pelves bilaterally. Largest stone on the left is in
the midpole measuring 10 mm. Largest stone on the right is layering
in the renal pelvis measuring 11 mm. There are several layering
stones within the urinary bladder is well. No visible urinary
*********** at no visible ureteral stones. Adrenal glands
unremarkable.

Stomach/Bowel: Large stool burden in the rectosigmoid colon
concerning for fecal impaction. Moderate stool burden throughout the
remainder of the colon. Stomach and small bowel decompressed,
unremarkable. No diverticulosis or diverticulitis.

Vascular/Lymphatic: Aortic atherosclerosis. No enlarged abdominal or
pelvic lymph nodes.

Reproductive: No visible focal abnormality.

Other: No free fluid or free air.

Musculoskeletal: No acute bony abnormality.
IMPRESSION: Dense consolidation in the left lower lobe with early infiltrate at
the right lung base. Findings compatible with pneumonia.

Numerous bilateral renal stones. Mild hydronephrosis bilaterally
without visible ureteral stones. There are layering stones within
the renal pelves bilaterally. Multiple small dependent stones in the
bladder.

Large stool burden in the colon, particularly rectosigmoid colon
concerning for fecal impaction.

Aortoiliac atherosclerosis.
